# Patient Record
Sex: Male | Born: 1940 | Race: Black or African American | Hispanic: No | Smoking: Former smoker
Health system: Southern US, Community
[De-identification: ages and names within clinical notes are randomized; demographics above are authoritative.]

## PROBLEM LIST (undated history)

## (undated) DIAGNOSIS — K219 Gastro-esophageal reflux disease without esophagitis: Secondary | ICD-10-CM

## (undated) DIAGNOSIS — E7800 Pure hypercholesterolemia, unspecified: Secondary | ICD-10-CM

## (undated) DIAGNOSIS — G629 Polyneuropathy, unspecified: Secondary | ICD-10-CM

## (undated) DIAGNOSIS — I10 Essential (primary) hypertension: Secondary | ICD-10-CM

## (undated) DIAGNOSIS — C189 Malignant neoplasm of colon, unspecified: Secondary | ICD-10-CM

## (undated) HISTORY — DX: Polyneuropathy, unspecified: G62.9

## (undated) HISTORY — DX: Essential (primary) hypertension: I10

## (undated) HISTORY — DX: Malignant neoplasm of colon, unspecified: C18.9

## (undated) HISTORY — PX: COLONOSCOPY: SHX174

## (undated) HISTORY — DX: Pure hypercholesterolemia, unspecified: E78.00

---

## 1997-06-28 ENCOUNTER — Emergency Department (HOSPITAL_COMMUNITY): Admission: EM | Admit: 1997-06-28 | Discharge: 1997-06-28 | Payer: Self-pay | Admitting: Emergency Medicine

## 2004-01-31 ENCOUNTER — Encounter: Admission: RE | Admit: 2004-01-31 | Discharge: 2004-04-15 | Payer: Self-pay | Admitting: Family Medicine

## 2010-10-11 DIAGNOSIS — C189 Malignant neoplasm of colon, unspecified: Secondary | ICD-10-CM

## 2010-10-11 HISTORY — DX: Malignant neoplasm of colon, unspecified: C18.9

## 2010-10-14 ENCOUNTER — Inpatient Hospital Stay (HOSPITAL_COMMUNITY)
Admission: AD | Admit: 2010-10-14 | Discharge: 2010-11-01 | DRG: 330 | Disposition: A | Discharge status: Home Health | Payer: Medicare Other | Source: Other Acute Inpatient Hospital | Attending: General Surgery | Admitting: General Surgery

## 2010-10-14 DIAGNOSIS — Z87891 Personal history of nicotine dependence: Secondary | ICD-10-CM

## 2010-10-14 DIAGNOSIS — E039 Hypothyroidism, unspecified: Secondary | ICD-10-CM | POA: Diagnosis present

## 2010-10-14 DIAGNOSIS — K560 Paralytic ileus: Secondary | ICD-10-CM | POA: Diagnosis not present

## 2010-10-14 DIAGNOSIS — J9819 Other pulmonary collapse: Secondary | ICD-10-CM | POA: Diagnosis not present

## 2010-10-14 DIAGNOSIS — K56609 Unspecified intestinal obstruction, unspecified as to partial versus complete obstruction: Secondary | ICD-10-CM | POA: Diagnosis present

## 2010-10-14 DIAGNOSIS — E785 Hyperlipidemia, unspecified: Secondary | ICD-10-CM | POA: Diagnosis present

## 2010-10-14 DIAGNOSIS — E876 Hypokalemia: Secondary | ICD-10-CM | POA: Diagnosis not present

## 2010-10-14 DIAGNOSIS — I10 Essential (primary) hypertension: Secondary | ICD-10-CM | POA: Diagnosis present

## 2010-10-14 DIAGNOSIS — K219 Gastro-esophageal reflux disease without esophagitis: Secondary | ICD-10-CM | POA: Diagnosis present

## 2010-10-14 DIAGNOSIS — E7800 Pure hypercholesterolemia, unspecified: Secondary | ICD-10-CM | POA: Diagnosis present

## 2010-10-14 DIAGNOSIS — C187 Malignant neoplasm of sigmoid colon: Principal | ICD-10-CM | POA: Diagnosis present

## 2010-10-15 ENCOUNTER — Inpatient Hospital Stay (HOSPITAL_COMMUNITY): Payer: Medicare Other

## 2010-10-15 ENCOUNTER — Encounter (HOSPITAL_COMMUNITY): Payer: Self-pay | Admitting: Radiology

## 2010-10-15 LAB — DIFFERENTIAL
Basophils Absolute: 0 10*3/uL (ref 0.0–0.1)
Basophils Relative: 0 % (ref 0–1)
Eosinophils Relative: 0 % (ref 0–5)
Monocytes Absolute: 1.1 10*3/uL — ABNORMAL HIGH (ref 0.1–1.0)

## 2010-10-15 LAB — COMPREHENSIVE METABOLIC PANEL
ALT: 16 U/L (ref 0–53)
AST: 18 U/L (ref 0–37)
Albumin: 3.8 g/dL (ref 3.5–5.2)
BUN: 22 mg/dL (ref 6–23)
CO2: 20 mEq/L (ref 19–32)
Chloride: 105 mEq/L (ref 96–112)
Chloride: 104 mEq/L (ref 96–112)
Creatinine, Ser: 1.21 mg/dL (ref 0.50–1.35)
Creatinine, Ser: 1.43 mg/dL — ABNORMAL HIGH (ref 0.50–1.35)
GFR calc non Af Amer: 60 mL/min — ABNORMAL LOW (ref >60)
Sodium: 139 mEq/L (ref 135–145)
Total Bilirubin: 0.8 mg/dL (ref 0.3–1.2)
Total Bilirubin: 0.7 mg/dL (ref 0.3–1.2)
Total Protein: 7.6 g/dL (ref 6.0–8.3)

## 2010-10-15 LAB — LACTIC ACID, PLASMA: Lactic Acid, Venous: 1.2 mmol/L (ref 0.5–2.2)

## 2010-10-15 LAB — PROTIME-INR
INR: 1.01 (ref 0.00–1.49)
Prothrombin Time: 13.5 seconds (ref 11.6–15.2)

## 2010-10-15 LAB — CBC
MCHC: 32.9 g/dL (ref 30.0–36.0)
RDW: 15.2 % (ref 11.5–15.5)

## 2010-10-15 LAB — MAGNESIUM: Magnesium: 2.6 mg/dL — ABNORMAL HIGH (ref 1.5–2.5)

## 2010-10-15 MED ORDER — IOHEXOL 300 MG/ML  SOLN
100.0000 mL | Freq: Once | INTRAMUSCULAR | Status: AC | PRN
  Administered 2010-10-15: 100 mL via INTRAVENOUS

## 2010-10-16 ENCOUNTER — Inpatient Hospital Stay (HOSPITAL_COMMUNITY): Payer: Medicare Other

## 2010-10-16 LAB — DIFFERENTIAL
Basophils Absolute: 0 10*3/uL (ref 0.0–0.1)
Eosinophils Absolute: 0 10*3/uL (ref 0.0–0.7)
Lymphs Abs: 1.1 10*3/uL (ref 0.7–4.0)
Neutrophils Relative %: 69 % (ref 43–77)

## 2010-10-16 LAB — COMPREHENSIVE METABOLIC PANEL
ALT: 16 U/L (ref 0–53)
AST: 16 U/L (ref 0–37)
Alkaline Phosphatase: 62 U/L (ref 39–117)
CO2: 28 mEq/L (ref 19–32)
Chloride: 107 mEq/L (ref 96–112)
Creatinine, Ser: 1.31 mg/dL (ref 0.50–1.35)
GFR calc non Af Amer: 55 mL/min — ABNORMAL LOW (ref >60)
Potassium: 4.2 mEq/L (ref 3.5–5.1)
Sodium: 144 mEq/L (ref 135–145)
Total Bilirubin: 0.7 mg/dL (ref 0.3–1.2)

## 2010-10-16 LAB — CBC
MCV: 84.4 fL (ref 78.0–100.0)
Platelets: 213 10*3/uL (ref 150–400)
RBC: 5.26 MIL/uL (ref 4.22–5.81)
WBC: 7.2 10*3/uL (ref 4.0–10.5)

## 2010-10-17 ENCOUNTER — Inpatient Hospital Stay (HOSPITAL_COMMUNITY): Payer: Medicare Other

## 2010-10-17 LAB — COMPREHENSIVE METABOLIC PANEL
ALT: 15 U/L (ref 0–53)
AST: 24 U/L (ref 0–37)
Calcium: 8.7 mg/dL (ref 8.4–10.5)
Creatinine, Ser: 1.25 mg/dL (ref 0.50–1.35)
GFR calc Af Amer: >60 mL/min (ref >60)
Glucose, Bld: 109 mg/dL — ABNORMAL HIGH (ref 70–99)
Sodium: 145 mEq/L (ref 135–145)
Total Protein: 6.9 g/dL (ref 6.0–8.3)

## 2010-10-17 LAB — CBC
MCH: 27.4 pg (ref 26.0–34.0)
MCHC: 32.6 g/dL (ref 30.0–36.0)
MCV: 84.2 fL (ref 78.0–100.0)
Platelets: 213 10*3/uL (ref 150–400)

## 2010-10-17 LAB — DIFFERENTIAL
Basophils Relative: 0 % (ref 0–1)
Eosinophils Absolute: 0 10*3/uL (ref 0.0–0.7)
Eosinophils Relative: 1 % (ref 0–5)
Lymphs Abs: 1.1 10*3/uL (ref 0.7–4.0)
Monocytes Absolute: 1 10*3/uL (ref 0.1–1.0)
Monocytes Relative: 18 % — ABNORMAL HIGH (ref 3–12)

## 2010-10-17 MED ORDER — IOHEXOL 300 MG/ML  SOLN
450.0000 mL | Freq: Once | INTRAMUSCULAR | Status: AC | PRN
  Administered 2010-10-17: 450 mL

## 2010-10-18 ENCOUNTER — Other Ambulatory Visit: Payer: Self-pay | Admitting: Gastroenterology

## 2010-10-18 LAB — CBC
HCT: 42.9 % (ref 39.0–52.0)
Hemoglobin: 13.7 g/dL (ref 13.0–17.0)
MCH: 27.1 pg (ref 26.0–34.0)
MCHC: 31.9 g/dL (ref 30.0–36.0)
MCV: 85 fL (ref 78.0–100.0)
Platelets: 192 10*3/uL (ref 150–400)
RBC: 5.05 MIL/uL (ref 4.22–5.81)
RDW: 15.6 % — ABNORMAL HIGH (ref 11.5–15.5)
WBC: 5 10*3/uL (ref 4.0–10.5)

## 2010-10-18 LAB — COMPREHENSIVE METABOLIC PANEL
ALT: 13 U/L (ref 0–53)
AST: 12 U/L (ref 0–37)
Albumin: 3.2 g/dL — ABNORMAL LOW (ref 3.5–5.2)
Alkaline Phosphatase: 55 U/L (ref 39–117)
BUN: 22 mg/dL (ref 6–23)
CO2: 26 mEq/L (ref 19–32)
Calcium: 8.3 mg/dL — ABNORMAL LOW (ref 8.4–10.5)
Chloride: 111 mEq/L (ref 96–112)
Creatinine, Ser: 1.09 mg/dL (ref 0.50–1.35)
GFR calc Af Amer: >60 mL/min (ref >60)
GFR calc non Af Amer: >60 mL/min (ref >60)
Glucose, Bld: 102 mg/dL — ABNORMAL HIGH (ref 70–99)
Potassium: 4 mEq/L (ref 3.5–5.1)
Sodium: 147 mEq/L — ABNORMAL HIGH (ref 135–145)
Total Bilirubin: 0.6 mg/dL (ref 0.3–1.2)
Total Protein: 6.4 g/dL (ref 6.0–8.3)

## 2010-10-18 LAB — DIFFERENTIAL
Basophils Absolute: 0 10*3/uL (ref 0.0–0.1)
Basophils Relative: 1 % (ref 0–1)
Eosinophils Absolute: 0 10*3/uL (ref 0.0–0.7)
Eosinophils Relative: 1 % (ref 0–5)
Lymphocytes Relative: 20 % (ref 12–46)
Lymphs Abs: 1 10*3/uL (ref 0.7–4.0)
Monocytes Absolute: 1 10*3/uL (ref 0.1–1.0)
Monocytes Relative: 20 % — ABNORMAL HIGH (ref 3–12)
Neutro Abs: 3 10*3/uL (ref 1.7–7.7)
Neutrophils Relative %: 59 % (ref 43–77)

## 2010-10-19 DIAGNOSIS — K56609 Unspecified intestinal obstruction, unspecified as to partial versus complete obstruction: Secondary | ICD-10-CM

## 2010-10-19 DIAGNOSIS — C189 Malignant neoplasm of colon, unspecified: Secondary | ICD-10-CM

## 2010-10-19 LAB — CEA: CEA: 0.7 ng/mL (ref 0.0–5.0)

## 2010-10-19 LAB — CBC
HCT: 43 % (ref 39.0–52.0)
MCH: 27.6 pg (ref 26.0–34.0)
MCV: 84.3 fL (ref 78.0–100.0)
Platelets: 207 10*3/uL (ref 150–400)
RBC: 5.1 MIL/uL (ref 4.22–5.81)

## 2010-10-19 LAB — BASIC METABOLIC PANEL
BUN: 23 mg/dL (ref 6–23)
CO2: 26 mEq/L (ref 19–32)
Calcium: 8.4 mg/dL (ref 8.4–10.5)
Chloride: 111 mEq/L (ref 96–112)
Creatinine, Ser: 1.25 mg/dL (ref 0.50–1.35)

## 2010-10-19 NOTE — Progress Notes (Signed)
NAME:  Patrick Woodward, PULS NO.:  [id number]  MEDICAL RECORD NO.:  [id number]  LOCATION:  1318                         FACILITY:  [facility]  PHYSICIAN:  Talmage Nap, MD  DATE OF BIRTH:  Aug 22, 1940                                PROGRESS NOTE   PRIMARY CARE PHYSICIAN: Merlene Laughter. Renae Gloss, M.D.  CONSULTANTS: Involved in the case, 1. GI, Shirley Friar, MD/John C. Madilyn Fireman, M.D. 2. General Surgery, Sandria Bales. Ezzard Standing, M.D.  DIAGNOSES: 1. Large bowel obstruction, status post NG tube, status post flex     sigmoidoscopy.  Finding, obstructing mass at the transition     point from descending colon to the sigmoid colon.  Pathology report     pending. 2. Hypertension. 3. Hyperlipidemia. 4. Gastroesophageal reflux disease.  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  The patient was also said to have been constipated for about 2 days.  There was no history of fever.  There was no history of chills.  No rigor.  He, however, saw a physician at the [facility] and a CT done showed a 3 cm constrictive mass at the proximal sigmoid colon with dilated colon subsequently.  The patient requested to be transferred to [facility] since he lives here.  He was, however, admitted by Dr. Carlota Raspberry, for further evaluation and stabilization.  PREADMISSION MEDICATIONS: 1. Aspirin 325 mg p.o. daily. 2. Lisinopril 20 mg p.o. daily. 3. Metoprolol 25 mg daily. 4. Nexium 40 mg p.o. daily.  ALLERGIES: PROCARDIA.  PAST SURGICAL HISTORY: He has no known past surgical history.  SOCIAL HISTORY: Negative for alcohol or tobacco use.  The patient works as a Magazine features editor.  FAMILY HISTORY: York Spaniel to be positive for  colon cancer.  Father deceased from prostate CA.  REVIEW OF SYSTEMS: Essentially documented in the initial history and physical.  PHYSICAL EXAMINATION: At the time the patient was seen by the admitting physician, VITAL SIGNS:  Temperature is 98.6, pulse 100, blood pressure is 118/74, respiratory rate 18, saturating 93% on room air.  He is said to be uncomfortable, vomiting. HEENT:  Pupils are reactive to light and extraocular muscles intact. NECK:  He has no jugular venous distention.  No carotid bruit.  No lymphadenopathy. CHEST:  Clear to auscultation. HEART:  Heart sounds are 1 and 2. ABDOMEN:  Soft, diffusely tenderness more in the periumbilical area. There is no sign of peritoneal irritation.  Liver, spleen, kidney not palpable.  Bowel sounds are hypoactive. Extremities:  Show no pedal edema. NEUROLOGIC:  Nonfocal. MUSCULOSKELETAL SYSTEM:  Unremarkable. SKIN:  Said to be warm and dry.  LABORATORY DATA: Lactic acid level 1.2.  Comprehensive metabolic panel showed sodium of 139, potassium of 3.8, chloride of 105 with a bicarb of 20, glucose is 111, BUN is 17, creatinine is 1.21.  LFTs are normal.  Complete blood count with differential showed WBC of 13.4, hemoglobin of 14.6, hematocrit of 44.4, MCV 82.7, the platelet count of 207.  Coagulation profile  showed PT 13.5, INR 1.01. and a PTT of 31.  A repeat complete blood count with no differential done on October 19, 2010, showed WBC of 7.2, hemoglobin of 14.1, hematocrit of 43.0, MCV 84.3, platelet count of 207, magnesium level is 2.7 and basic metabolic panel showed sodium of 147, potassium of 4.0, chloride of 111, bicarb is 26, glucose is 101, BUN is 23, creatinine is 1.25.  IMAGING STUDIES: Done on the patient include CT of the abdomen and pelvis, which showed diffuse dilatation of a large and small bowel with smooth tapering of the large bowel from the level of the splenic flexure to the sigmoid colon.  Findings are most  consistent with significant ileus.  No evidence of mass or cyst.  There is no evidence for acute appendicitis. A repeat CT of the abdomen was done and it showed persistent distended small bowel and colon  and ileus, and barium enema, showed upper quadrant lesion at the junction between the descending colon as well as sigmoid colon.  Finding is concerning for malignancy just secondary to colonic neoplasm.  HOSPITAL COURSE: The patient was admitted to general medical floor.  He was made n.p.o., had NG tube and started with intermittent low pressure suction and the NG tube was said to be draining fecal material.  He was also placed on normal saline to go at rate of 100 cc an hour.  The patient was given Zofran and Phenergan for nausea and pain control was done with Dilantin 1-2 mg IV q.4 p.r.n.  The patient was followed and evaluated by me on a daily basis.  He was also evaluated by the gastroenterologist who subsequently did a flex sigmoidoscopy on the patient and his findings were that of an descending colonic mild that is obstructing and about the results has been awaited.  Throughout admission, the patient has been having continuous fluid drained from the NG tube.  Today, which is October 19, 2010, discussed this extensively with the general surgeon, Dr. Ezzard Standing, and he is agreeable to evaluate the patient.  CURRENT MEDICATIONS: 1. Dilaudid 2 mg IV q.3 p.r.n. 2. Zofran 4 mg IV q.6 p.r.n. 3. Phenergan 12.5 mg IV q.6 p.r.n. 4. NG tube with intermittent suction.  The patient will be followed and evaluated on a daily basis or the meantime, I would order a CEA level.     Talmage Nap, MD     CN/MEDQ  D:  10/19/2010  T:  10/19/2010  Job:  [id number]  Electronically Signed by Talmage Nap  on 10/19/2010 07:05:11 PM

## 2010-10-20 ENCOUNTER — Other Ambulatory Visit (INDEPENDENT_AMBULATORY_CARE_PROVIDER_SITE_OTHER): Payer: Self-pay | Admitting: Surgery

## 2010-10-20 HISTORY — PX: COLON SURGERY: SHX602

## 2010-10-20 LAB — DIFFERENTIAL
Basophils Relative: 1 % (ref 0–1)
Eosinophils Absolute: 0.1 10*3/uL (ref 0.0–0.7)
Eosinophils Relative: 1 % (ref 0–5)
Lymphocytes Relative: 13 % (ref 12–46)
Monocytes Relative: 20 % — ABNORMAL HIGH (ref 3–12)
Neutro Abs: 4.7 10*3/uL (ref 1.7–7.7)
Neutrophils Relative %: 65 % (ref 43–77)

## 2010-10-20 LAB — MAGNESIUM: Magnesium: 2.7 mg/dL — ABNORMAL HIGH (ref 1.5–2.5)

## 2010-10-20 LAB — COMPREHENSIVE METABOLIC PANEL
Albumin: 3 g/dL — ABNORMAL LOW (ref 3.5–5.2)
Alkaline Phosphatase: 52 U/L (ref 39–117)
BUN: 18 mg/dL (ref 6–23)
CO2: 30 mEq/L (ref 19–32)
Chloride: 109 mEq/L (ref 96–112)
Creatinine, Ser: 1.08 mg/dL (ref 0.50–1.35)
GFR calc non Af Amer: >60 mL/min (ref >60)
Potassium: 4 mEq/L (ref 3.5–5.1)
Total Bilirubin: 0.5 mg/dL (ref 0.3–1.2)

## 2010-10-20 LAB — CBC
HCT: 40.7 % (ref 39.0–52.0)
Hemoglobin: 13.2 g/dL (ref 13.0–17.0)
MCV: 84.6 fL (ref 78.0–100.0)
RBC: 4.81 MIL/uL (ref 4.22–5.81)
RDW: 15.6 % — ABNORMAL HIGH (ref 11.5–15.5)
WBC: 7.3 10*3/uL (ref 4.0–10.5)

## 2010-10-21 LAB — DIFFERENTIAL
Basophils Relative: 1 % (ref 0–1)
Eosinophils Absolute: 0.1 10*3/uL (ref 0.0–0.7)
Eosinophils Relative: 1 % (ref 0–5)
Lymphs Abs: 1 10*3/uL (ref 0.7–4.0)
Monocytes Absolute: 1.3 10*3/uL — ABNORMAL HIGH (ref 0.1–1.0)
Neutrophils Relative %: 64 % (ref 43–77)

## 2010-10-21 LAB — CBC
Hemoglobin: 14.9 g/dL (ref 13.0–17.0)
MCH: 27.4 pg (ref 26.0–34.0)
MCHC: 32.5 g/dL (ref 30.0–36.0)
MCV: 84.5 fL (ref 78.0–100.0)
RBC: 5.43 MIL/uL (ref 4.22–5.81)

## 2010-10-21 LAB — COMPREHENSIVE METABOLIC PANEL
CO2: 31 mEq/L (ref 19–32)
Calcium: 8.2 mg/dL — ABNORMAL LOW (ref 8.4–10.5)
Creatinine, Ser: 1.14 mg/dL (ref 0.50–1.35)
GFR calc Af Amer: >60 mL/min (ref >60)
GFR calc non Af Amer: >60 mL/min (ref >60)
Glucose, Bld: 156 mg/dL — ABNORMAL HIGH (ref 70–99)
Total Protein: 5.9 g/dL — ABNORMAL LOW (ref 6.0–8.3)

## 2010-10-21 LAB — MAGNESIUM: Magnesium: 2.4 mg/dL (ref 1.5–2.5)

## 2010-10-21 NOTE — Op Note (Signed)
NAME:  JAHZIER, VILLALON NO.:  [id number]  MEDICAL RECORD NO.:  [id number]  LOCATION:  1318                         FACILITY:  [facility]  PHYSICIAN:  Maisie Fus A. Pauline Trainer, M.D.DATE OF BIRTH:  [birth year]  DATE OF PROCEDURE:  10/20/2010 DATE OF DISCHARGE:                              OPERATIVE REPORT   PREOPERATIVE DIAGNOSIS:  Obstructing colon mass at descending colon- sigmoid colon junction.  POSTOPERATIVE DIAGNOSIS:  Obstructing colon mass at descending colon- sigmoid colon junction.  PROCEDURE:  Partial colectomy with end colostomy with Hartmann pouch.  SURGEON:  Maisie Fus A. Jalexus Brett, M.D.  ASSISTANT:  Anselm Pancoast. Zachery Dakins, M.D.  ANESTHESIA:  General endotracheal anesthesia.  ESTIMATED BLOOD LOSS:  100 mL.  SPECIMEN:  Descending colon with colon mass to Pathology.  DRAINS:  None.  INDICATIONS FOR PROCEDURE:  The patient is a 70 year old male who was admitted 6 days ago to the medical service with large bowel obstruction. He was eventually worked up by GI, and found to have an obstructing colon mass at 40 cm from the anal verge, which was thought to be the distal descending colon and proximal sigmoid colon junction.  He was working in [place] and he was diagnosed down there about a week ago and transferred here.  He has had a large bowel obstruction and treated with NG tube.  He presents today for surgery since he has not improved with conservative measures.  This was also documented on CT scan.  Risk of bleeding, infection, death, intraabdominal abscess, organ injury, injury to the ureter, kidney, bladder, wound complication, ostomy problems, and worsening of medical conditions were discussed.  He understood the above and agreed to proceed.  DESCRIPTION OF PROCEDURE:  The patient was brought to the operating room after being seen in the holding area.  I answered his questions. Procedure was discussed in the holding area and questions were answered to  the best of my ability.  He was then taken back to the operating room.  After induction of general anesthesia, the abdomen was prepped and draped in sterile fashion.  Foley catheter was placed.  He received 1 g of Invanz.  A time-out was done.  Midline incision was used.  We entered the abdominal cavity easily.  He had significant dilated small bowel in ascending, transverse, and descending colon down to a mass, which was located at the distal descending colon-sigmoid colon junction. I was able to move the small bowel out of the way using a retractor, and then mobilized the sigmoid colon and descending colon up into the wound. This was a large obstructing hard mass, that looked to be a cancer.  I then used a GIA-75 stapling device, divided approximately about 3 cm, and then one additional 6 to 7 cm distal to that and divided with stapler again.  We used a LigaSure to take the mesentery down to the IMA.  I oversewed the IMA with 2-0 Vicryl.  The ureter in the left side was identified and preserved.  Mass was sent to Pathology and gross margins were negative.  At this point, I irrigated out the abdominal cavity.  Small piece of Seprafilm was placed on the base of the Hartmann stump, which was  relatively generous.  We then found appropriate spot to pull out the descending colostomy.  Kocher was used and the appropriate spot was identified just to the left of the umbilicus.  Curvilinear incision was made.  We then dissected down the rectus muscle and made a cruciate incision in the rectus muscle  and we were able to pass 2 fingers through this.  We then were able to reach in and pull the colon through the colostomy.  I put two securing sutures at the undersurface of the fascia to secure the colostomy to the abdominal wall.  We then ran the small bowel through loop of Treitz to the ileocecal valve.  This was dilated into the cecum.  The ascending and transverse colons were also distended  significantly without any signs of necrosis or perforation.  I could not view any other mass in the colon, but his colonoscopy did not go past the mass.  I could not feel anything at the ascending, transverse, descending colon.  Gallbladder was distended without stones.  Liver was normal.  NG tube was in the stomach.  At this point in time, we went ahead and closed the fascia after sponge, needle and instrument counts were done.  The fascia was closed with double- stranded #1 PDS.  I then went ahead and stapled the skin closed.  The colostomy matured with 3-0 Monocryl.  Appliances were applied as well as __________.  All final counts of sponge, needle, and instruments were found to be correct at this portion of the case.  The patient was awoken, extubated, and taken to recovery in satisfactory condition.     Shamra Bradeen A. Mitali Shenefield, M.D.     TAC/MEDQ  D:  10/20/2010  T:  10/20/2010  Job:  [id number]  Electronically Signed by Harriette Bouillon M.D. on 10/21/2010 10:35:27 AM

## 2010-10-22 ENCOUNTER — Inpatient Hospital Stay (HOSPITAL_COMMUNITY): Payer: Medicare Other

## 2010-10-22 LAB — COMPREHENSIVE METABOLIC PANEL
ALT: 15 U/L (ref 0–53)
AST: 24 U/L (ref 0–37)
CO2: 32 mEq/L (ref 19–32)
Calcium: 8.4 mg/dL (ref 8.4–10.5)
GFR calc non Af Amer: >60 mL/min (ref >60)
Potassium: 4.3 mEq/L (ref 3.5–5.1)
Sodium: 144 mEq/L (ref 135–145)

## 2010-10-22 LAB — DIFFERENTIAL
Basophils Absolute: 0.1 10*3/uL (ref 0.0–0.1)
Eosinophils Absolute: 0.1 10*3/uL (ref 0.0–0.7)
Lymphocytes Relative: 16 % (ref 12–46)
Neutrophils Relative %: 60 % (ref 43–77)

## 2010-10-22 LAB — CBC
MCH: 27 pg (ref 26.0–34.0)
Platelets: 177 10*3/uL (ref 150–400)
RBC: 5.04 MIL/uL (ref 4.22–5.81)
WBC: 6.9 10*3/uL (ref 4.0–10.5)

## 2010-10-24 DIAGNOSIS — E46 Unspecified protein-calorie malnutrition: Secondary | ICD-10-CM

## 2010-10-24 LAB — COMPREHENSIVE METABOLIC PANEL
ALT: 22 U/L (ref 0–53)
Calcium: 8.2 mg/dL — ABNORMAL LOW (ref 8.4–10.5)
Creatinine, Ser: 0.95 mg/dL (ref 0.50–1.35)
GFR calc Af Amer: >60 mL/min (ref >60)
GFR calc non Af Amer: >60 mL/min (ref >60)
Glucose, Bld: 118 mg/dL — ABNORMAL HIGH (ref 70–99)
Sodium: 144 mEq/L (ref 135–145)
Total Protein: 5.7 g/dL — ABNORMAL LOW (ref 6.0–8.3)

## 2010-10-24 LAB — DIFFERENTIAL
Basophils Absolute: 0.1 10*3/uL (ref 0.0–0.1)
Basophils Relative: 1 % (ref 0–1)
Eosinophils Absolute: 0.1 10*3/uL (ref 0.0–0.7)
Lymphocytes Relative: 4 % — ABNORMAL LOW (ref 12–46)
Monocytes Absolute: 0.4 10*3/uL (ref 0.1–1.0)
Neutrophils Relative %: 88 % — ABNORMAL HIGH (ref 43–77)

## 2010-10-24 LAB — GLUCOSE, CAPILLARY
Glucose-Capillary: 108 mg/dL — ABNORMAL HIGH (ref 70–99)
Glucose-Capillary: 123 mg/dL — ABNORMAL HIGH (ref 70–99)

## 2010-10-24 LAB — CBC
MCHC: 31.6 g/dL (ref 30.0–36.0)
Platelets: 160 10*3/uL (ref 150–400)
RDW: 15.7 % — ABNORMAL HIGH (ref 11.5–15.5)
WBC: 7.3 10*3/uL (ref 4.0–10.5)

## 2010-10-24 LAB — PREALBUMIN: Prealbumin: 6 mg/dL — ABNORMAL LOW (ref 17.0–34.0)

## 2010-10-24 LAB — CHOLESTEROL, TOTAL: Cholesterol: 105 mg/dL (ref 0–200)

## 2010-10-24 LAB — TRIGLYCERIDES: Triglycerides: 169 mg/dL — ABNORMAL HIGH (ref <150)

## 2010-10-24 LAB — PHOSPHORUS: Phosphorus: 2.7 mg/dL (ref 2.3–4.6)

## 2010-10-25 LAB — COMPREHENSIVE METABOLIC PANEL
ALT: 30 U/L (ref 0–53)
Alkaline Phosphatase: 75 U/L (ref 39–117)
BUN: 16 mg/dL (ref 6–23)
Chloride: 102 mEq/L (ref 96–112)
GFR calc Af Amer: >60 mL/min (ref >60)
Glucose, Bld: 110 mg/dL — ABNORMAL HIGH (ref 70–99)
Potassium: 3.3 mEq/L — ABNORMAL LOW (ref 3.5–5.1)
Sodium: 140 mEq/L (ref 135–145)
Total Bilirubin: 0.4 mg/dL (ref 0.3–1.2)
Total Protein: 6.1 g/dL (ref 6.0–8.3)

## 2010-10-25 LAB — GLUCOSE, CAPILLARY
Glucose-Capillary: 121 mg/dL — ABNORMAL HIGH (ref 70–99)
Glucose-Capillary: 117 mg/dL — ABNORMAL HIGH (ref 70–99)

## 2010-10-25 LAB — MAGNESIUM: Magnesium: 2.4 mg/dL (ref 1.5–2.5)

## 2010-10-26 LAB — BASIC METABOLIC PANEL
CO2: 28 mEq/L (ref 19–32)
Calcium: 8.7 mg/dL (ref 8.4–10.5)
GFR calc non Af Amer: >60 mL/min (ref >60)
Glucose, Bld: 123 mg/dL — ABNORMAL HIGH (ref 70–99)
Potassium: 3.4 mEq/L — ABNORMAL LOW (ref 3.5–5.1)
Sodium: 138 mEq/L (ref 135–145)

## 2010-10-26 LAB — GLUCOSE, CAPILLARY
Glucose-Capillary: 126 mg/dL — ABNORMAL HIGH (ref 70–99)
Glucose-Capillary: 138 mg/dL — ABNORMAL HIGH (ref 70–99)
Glucose-Capillary: 128 mg/dL — ABNORMAL HIGH (ref 70–99)

## 2010-10-27 LAB — COMPREHENSIVE METABOLIC PANEL
Albumin: 2.7 g/dL — ABNORMAL LOW (ref 3.5–5.2)
BUN: 19 mg/dL (ref 6–23)
Creatinine, Ser: 1.06 mg/dL (ref 0.50–1.35)
Total Protein: 6.9 g/dL (ref 6.0–8.3)

## 2010-10-27 LAB — DIFFERENTIAL
Basophils Absolute: 0.1 10*3/uL (ref 0.0–0.1)
Basophils Relative: 1 % (ref 0–1)
Eosinophils Relative: 2 % (ref 0–5)
Monocytes Absolute: 0.9 10*3/uL (ref 0.1–1.0)

## 2010-10-27 LAB — CHOLESTEROL, TOTAL: Cholesterol: 107 mg/dL (ref 0–200)

## 2010-10-27 LAB — GLUCOSE, CAPILLARY
Glucose-Capillary: 132 mg/dL — ABNORMAL HIGH (ref 70–99)
Glucose-Capillary: 133 mg/dL — ABNORMAL HIGH (ref 70–99)

## 2010-10-27 LAB — CBC
MCHC: 33.6 g/dL (ref 30.0–36.0)
Platelets: 197 10*3/uL (ref 150–400)
RDW: 15.2 % (ref 11.5–15.5)
WBC: 9.2 10*3/uL (ref 4.0–10.5)

## 2010-10-27 LAB — PHOSPHORUS: Phosphorus: 3.3 mg/dL (ref 2.3–4.6)

## 2010-10-27 LAB — MAGNESIUM: Magnesium: 2.3 mg/dL (ref 1.5–2.5)

## 2010-10-27 LAB — TRIGLYCERIDES: Triglycerides: 169 mg/dL — ABNORMAL HIGH (ref <150)

## 2010-10-27 LAB — PREALBUMIN: Prealbumin: 19 mg/dL (ref 17.0–34.0)

## 2010-10-28 ENCOUNTER — Inpatient Hospital Stay (HOSPITAL_COMMUNITY): Payer: Medicare Other

## 2010-10-28 LAB — CULTURE, BLOOD (ROUTINE X 2)
Culture  Setup Time: 201209120849
Culture: NO GROWTH

## 2010-10-28 LAB — GLUCOSE, CAPILLARY
Glucose-Capillary: 138 mg/dL — ABNORMAL HIGH (ref 70–99)
Glucose-Capillary: 122 mg/dL — ABNORMAL HIGH (ref 70–99)

## 2010-10-29 LAB — BASIC METABOLIC PANEL
CO2: 27 mEq/L (ref 19–32)
Calcium: 9 mg/dL (ref 8.4–10.5)
Creatinine, Ser: 1.03 mg/dL (ref 0.50–1.35)
GFR calc Af Amer: >60 mL/min (ref >60)

## 2010-10-29 LAB — GLUCOSE, CAPILLARY
Glucose-Capillary: 114 mg/dL — ABNORMAL HIGH (ref 70–99)
Glucose-Capillary: 129 mg/dL — ABNORMAL HIGH (ref 70–99)
Glucose-Capillary: 138 mg/dL — ABNORMAL HIGH (ref 70–99)

## 2010-10-29 NOTE — Consult Note (Signed)
NAME:  Patrick Woodward, KUTCH NO.:  [id number]  MEDICAL RECORD NO.:  [id number]  LOCATION:  1318                         FACILITY:  [facility]  PHYSICIAN:  Shirley Friar, MDDATE OF BIRTH:  1940-05-14  DATE OF CONSULTATION: DATE OF DISCHARGE:                                CONSULTATION   REQUESTING PHYSICIAN:  Talmage Nap, MD  REASON FOR CONSULTATION:  Abdominal pain, bowel obstruction.  HISTORY OF PRESENT ILLNESS:  Mr. Smelser is a 70 year old black male, who was in [place] for work, when he was admitted down there for diffuse abdominal pain, abdominal distention, nausea, and vomiting, he was worked up there and a CT scan reportedly showed a constrictive lesion in his sigmoid colon.  He requested to be sent to [facility] in [place], which is where he is from and he has been medically managed here.  He has NG tube in for low wall suction.  He continues to have abdominal pain, nausea, and vomiting.  A CT scan done on September 6, here with contrast did not show a mass in the colon, but did show diffuse dilation of the large and small bowel.  It also reported small hepatic cyst with no evidence of acute appendicitis. Today, he had a Gastrografin study done, which showed an apple-core lesion at the junction between the distal descending colon and sigmoid colon consistent with a colonic neoplasm.  Small amount of contrast passed into the descending colon.  Mr. Sanderson reports having colonoscopy 7 to 8 years ago in [place] and states that he had a couple of polyps removed, but the results are not available at this time.  He has been having constipation recently prior to his admission to the hospital in [place].  PAST MEDICAL HISTORY: 1. Hypertension. 2. Hyperlipidemia. 3. Gastroesophageal reflux disease.  PAST SURGICAL HISTORY:  None.  CURRENT MEDICATIONS:  Subcu heparin, Peridex, Biotin; doses listed in the  hospital record, additional medicines listed p.r.n.  ALLERGIES:  NIFEDIPINE.  FAMILY HISTORY:  Mother died of colon cancer, otherwise noncontributory.  SOCIAL HISTORY:  Former smoker, denies alcohol.  REVIEW OF SYSTEMS:  Negative from GI standpoint except stated above.  PHYSICAL EXAMINATION:  VITAL SIGNS:  Temperature 98.2, pulse 86, blood pressure 107/57, O2 sat 95% on room air. GENERAL:  Alert, thin, no acute distress.  NG tube in place. ABDOMEN:  Distended, tender, decreased bowel sounds.  LABS:  White blood count 5.9 down from 13.4, hemoglobin 14.1, platelet 213, INR 1.01, BUN 25, creatinine 1.25, T-bili 0.5, ALP 57, AST 24, ALT 15.  Additional labs listed in the hospital record and were reviewed.  IMPRESSION: 52. A 70 year old black male with bowel obstruction due to a colon mass     at the distal descending colon/sigmoid colon junction that was seen     on Gastrografin enema as well as reportedly on an outside CT scan.     He will need a sigmoidoscopy tomorrow and surgical consult pending     his sigmoidoscopy results, which show a near obstructing lesion on     Gastrografin enema. 2. A flexible sigmoidoscopy with Fleet enema prep tomorrow by Dr.     Madilyn Fireman. 3. Continue NG suction.  4. NPO at the midnight except medicines. 5. Surgical consult will be needed, pending flexible sigmoidoscopy     results.  Discussed with Dr. Talmage Nap.     Shirley Friar, MD     VCS/MEDQ  D:  10/17/2010  T:  10/18/2010  Job:  [id number]  cc:   Merlene Laughter. Renae Gloss, M.D. Fax: [phone]  Electronically Signed by Charlott Rakes MD on 10/29/2010 11:13:52 AM

## 2010-10-30 LAB — GLUCOSE, CAPILLARY

## 2010-10-30 LAB — COMPREHENSIVE METABOLIC PANEL
ALT: 109 U/L — ABNORMAL HIGH (ref 0–53)
Alkaline Phosphatase: 113 U/L (ref 39–117)
GFR calc Af Amer: >60 mL/min (ref >60)
Glucose, Bld: 106 mg/dL — ABNORMAL HIGH (ref 70–99)
Potassium: 3.6 mEq/L (ref 3.5–5.1)
Sodium: 133 mEq/L — ABNORMAL LOW (ref 135–145)
Total Protein: 6 g/dL (ref 6.0–8.3)

## 2010-10-31 ENCOUNTER — Encounter: Payer: Medicare Other | Admitting: Oncology

## 2010-10-31 LAB — GLUCOSE, CAPILLARY: Glucose-Capillary: 109 mg/dL — ABNORMAL HIGH (ref 70–99)

## 2010-11-01 LAB — GLUCOSE, CAPILLARY: Glucose-Capillary: 103 mg/dL — ABNORMAL HIGH (ref 70–99)

## 2010-11-02 NOTE — Op Note (Signed)
  NAME:  Patrick Woodward, Patrick Woodward NO.:  [id number]  MEDICAL RECORD NO.:  [id number]  LOCATION:  1318                         FACILITY:  [facility]  PHYSICIAN:  Desteny Freeman C. Madilyn Fireman, M.D.    DATE OF BIRTH:  1940/12/09  DATE OF PROCEDURE:  10/18/2010 DATE OF DISCHARGE:                              OPERATIVE REPORT   OPERATION:  Flexible sigmoidoscopy with biopsy.  INDICATIONS FOR PROCEDURE:  Colonic obstruction with apple-core mass suggestive of adenocarcinoma on CT scan.  PROCEDURE:  The patient was placed in the left lateral decubitus position and placed on the pulse monitor with continuous low-flow oxygen delivered by nasal cannula.  He was sedated with 31.25 mg IV fentanyl, 4 mg IV Versed and 25 mg IV Benadryl.  The Olympus video colonoscope was inserted into the rectum and advanced to approximately 40 cm where a circumferential obstructing mass was encountered, typical of an adenocarcinoma.  I could not find the lumen within the mass.  Multiple biopsies were taken and further documentation obtained.  The mucosa distal to the lesion all the way down to the rectum appeared normal with no diverticula or other abnormality seen.  The scope was then withdrawn and the patient returned to the recovery room in stable condition.  He tolerated procedure well.  There were no immediate complications.  IMPRESSION:  Ascending colon mass, obstructing.  PLAN:  Await biopsy results and will need Surgery consult.          ______________________________ Everardo All. Madilyn Fireman, M.D.     JCH/MEDQ  D:  10/18/2010  T:  10/18/2010  Job:  [id number]  Electronically Signed by Dorena Cookey M.D. on 11/02/2010 11:42:41 AM
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
## 2010-11-05 ENCOUNTER — Encounter (INDEPENDENT_AMBULATORY_CARE_PROVIDER_SITE_OTHER): Payer: Self-pay | Admitting: Surgery

## 2010-11-05 ENCOUNTER — Ambulatory Visit (INDEPENDENT_AMBULATORY_CARE_PROVIDER_SITE_OTHER): Payer: Medicare Other | Admitting: Surgery

## 2010-11-05 VITALS — BP 102/68 | HR 80 | Temp 97.1°F | Resp 20 | Ht 77.5 in | Wt 161.2 lb

## 2010-11-05 DIAGNOSIS — Z9889 Other specified postprocedural states: Secondary | ICD-10-CM

## 2010-11-05 NOTE — Progress Notes (Signed)
The patient is 3 weeks status post left hemicolectomy for a T3 N1MX COLON CANCER.  He presented with obstruction.  He is doing well.   On exam:  Incision well healed.  Ostomy viable and funtioning. Impression : T3N1MX stage 3 colon cancer Plan:  Refer to medical oncology.  May need port.  Follow up 3 weeks.

## 2010-11-05 NOTE — Patient Instructions (Signed)
Follow up 3 weeks

## 2010-11-12 NOTE — H&P (Signed)
NAME:  JAHAN, FRIEDLANDER NO.:  [id number]  MEDICAL RECORD NO.:  [id number]  LOCATION:  1318                         FACILITY:  [facility]  PHYSICIAN:  Carlota Raspberry, MD         DATE OF BIRTH:  [birth year]  DATE OF ADMISSION:  10/14/2010 DATE OF DISCHARGE:                             HISTORY & PHYSICAL   PRIMARY CARE PHYSICIAN:  Dr. Mikeal Hawthorne  CHIEF COMPLAINT:  Abdominal pain transfer from [facility] with small bowel obstruction.  HISTORY OF PRESENT ILLNESS:  This is a 70 year old male with a history of hypertension, hyperlipidemia, and GERD who was in his usual state of health until October 13, 2010 when he was visiting [place] as part of his job as a Database administrator.  He was admitted to the hospital with diffuse periumbilical abdominal pain and increased abdominal distention, also with nausea and vomiting, and no bowel movement for 2 days, which was unusual for him.  He was admitted to the hospital where his lab work was fairly unrevealing including a white blood cell count of 10 and a creatinine of 1.3 and an abdominal x-ray initially showed a nonspecific ileus pattern, but then he then underwent a CT scan of his abdomen and pelvis, which showed a 3 cm constrictive lesion in his proximal sigmoid colon with gas and stool within the dilated colon proximal to the lesion with the impression of a probable partially obstructed carcinoma within the proximal sigmoid colon with colonoscopy recommended.  Given that he is from [place], was only visiting, he requested transfer to the [facility] system.  On arrival, the patient is complaining of significant nausea and probably begins to vomit while I am interviewing him with bilious yellow appearing vomit.  He continues to have cramping abdominal pain that is pretty severe around his abdomen.  He relates that his bowels have been "lot," but did have a small bowel movement on Sunday.  He is  unclear if he is passing flatus or not.  He denies any melena or any blood in his stool recently.  He also denies any weight changes, fevers, chills, night sweats, headache, dizziness, loss of consciousness, shortness of breath, cough, chest pain, bladder issues, or any rash.  REVIEW OF SYSTEMS:  As above, otherwise negative to all other systems.  PAST MEDICAL HISTORY: 1. Hypertension. 2. Hyperlipidemia. 3. Gastroesophageal reflux disease. 4. The patient had a colonoscopy within the last 5 years that he     believes that the results were negative.  PAST SURGICAL HISTORY:  None.  ALLERGIES:  PROCARDIA, which causes him to have palpitations.  HOME MEDICATIONS: 1. Aspirin 325 mg. 2. Lisinopril 20 mg. 3. Metoprolol tartrate 25 mg daily. 4. Nexium 40 mg daily.  SOCIAL HISTORY:  He quit smoking tobacco 35 years ago and he does not drink any more.  No alcohol.  No illicit drug use.  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  He has a wife and numerous family members who are in the Ministry.  FAMILY HISTORY:  Mother is deceased from colon cancer.  Father is  deceased from prostate cancer.  PHYSICAL EXAMINATION:  VITAL SIGNS:  Temperature 98.6, pulse 100, blood pressure 118/74, respirations 18, 93% on room air. GENERAL:  He is a thin male lying in the hospital bed with numerous family members at the bedside.  He appears quite uncomfortable with nausea and begins to vomit while we are talking to him.  He does not appear in extremis and is able to converse though. HEENT:  His pupils are equal, round, reactive to light.  His sclerae are clear, but do have scleral muddying.  They are anicteric.  His mouth is moist and normal-appearing with no apparent lesions. NECK:  Supple.  There is no cervical lymphadenopathy. LUNGS:  Clear to auscultation bilaterally without any crackles, wheezes, or rhonchi. HEART:   Regular rate and rhythm with a bit of tachycardia, but there are no apparent murmurs or gallops. ABDOMEN:  Not obese.  It is tight and very diffusely tender, but more so in the periumbilical area.  He does not have frank peritoneal signs, but does appear very tender to the lightest touch.  His bowel sounds are hypoactive. EXTREMITIES:  Warm and well-perfused without any cyanosis or clubbing. There is no bilateral lower extremity edema or chronic venous stasis changes.  His extremities are thin.  His radial pulses are easily palpable. NEUROLOGIC:  Grossly intact.  There are no focal neurological deficits. He is conversant, alert, oriented, and spontaneously moving all of his extremities. SKIN:  Warm, well-perfused, nondiaphoretic, and without any appreciable noticeable rashes.  LABORATORY DATA:  There is no lab work in our system.  Lab work from the outside hospital shows a white blood cell count of 10.0 with a normal differential, hematocrit is 42.7, and platelet count 173.  Magnesium was 2.2, phosphorus 3.8.  Amylase was 71.  Sodium 142, potassium 3.8, chloride 109, bicarb 25, glucose 103, BUN 11, creatinine 1.2, calcium was 8.5, total protein 6.8, albumin 3.7, ALT and AST are both 18, alk phos is 58, T bili is 0.8.  Lipase is 25.  IMAGING DATA:  CT abdomen and pelvis from [facility] shows bibasilar infiltrates in his bilateral lung bases, 3 cm constrictive lesion in the proximal sigmoid colon with gas and stool within the dilated colon proximal to the lesion.  Kidney, spleen, and liver are unremarkable except for multiple left hepatic cysts of which the largest measures 19 mm.  Gallbladder, adrenal glands, pancreas, aorta, small bowel, appendix, soft tissues, and bony structures are unremarkable.  Probable partially obstructed carcinoma within the proximal sigmoid colon, colonoscopy recommended.  CT scan of pelvis, the prostate is slightly enlarged.  The bladder, soft  tissues, and the bony structures are unremarkable.  Impression, mild prostatic enlargement.  An x-ray of his abdomen with the flattened decubitus, impression shows probable ileus.  UA shows trace ketones, trace proteins, no signs of infection, and 1.0 urine urobilinogen  IMPRESSION:  This is a 70 year old male with a history of hypertension, hyperlipidemia, and otherwise fairly healthy with the family history of colon cancer who presents with a 3 cm constrictive lesion in the proximal sigmoid colon and evidence for bowel obstruction. 1. Large bowel obstruction.  He has no previous abdominal surgeries.     This is most likely due to what appears to be an annular 3 cm     proximal sigmoid carcinoma.  He will need a GI consult and     colonoscopy and likely need a surgical consult as well.  For overnight, I have repeatedly urged the  patient to get an NG tube as he is actively vomiting in front of me, but he continues to refuse it. Therefore, at this point, we will give him IV fluids, keep him n.p.o., and control his nausea and pain with Zofran and Dilaudid.  We will get some blood work right now including CBC with differential, CMET, LFTs, and venous lactate, 1. History of hypertension.  The patient is currently normotensive and    we will hold his home lisinopril and metoprolol given his acute     issues.  We will also hold his home aspirin 325. 2. Fluid, electrolytes, and nutrition.  We will run continuous 100     cc/hour of normal saline and keep him n.p.o.  IV access.  He has one peripheral IV.  Prophylaxis.  We will cover wound with subcutaneous heparin and pain control as above.  We will hold off on a bowel regimen for now given the obstruction.  CODE STATUS:  He is full code as discussed with the patient.  The patient will be admitted to [facility], but an unclear team at this point.  I will figure this out.          ______________________________ Carlota Raspberry,  MD     EB/MEDQ  D:  10/15/2010  T:  10/15/2010  Job:  [id number]  Electronically Signed by Carlota Raspberry MD on 11/12/2010 12:18:56 PM

## 2010-11-21 ENCOUNTER — Encounter (HOSPITAL_BASED_OUTPATIENT_CLINIC_OR_DEPARTMENT_OTHER): Payer: Medicare Other | Admitting: Oncology

## 2010-11-21 DIAGNOSIS — C189 Malignant neoplasm of colon, unspecified: Secondary | ICD-10-CM

## 2010-11-21 DIAGNOSIS — R634 Abnormal weight loss: Secondary | ICD-10-CM

## 2010-11-25 ENCOUNTER — Ambulatory Visit (HOSPITAL_BASED_OUTPATIENT_CLINIC_OR_DEPARTMENT_OTHER)
Admission: RE | Admit: 2010-11-25 | Discharge: 2010-11-25 | Disposition: A | Discharge status: Home / Self Care | Payer: Medicare Other | Source: Ambulatory Visit | Attending: General Surgery | Admitting: General Surgery

## 2010-11-25 DIAGNOSIS — Z538 Procedure and treatment not carried out for other reasons: Secondary | ICD-10-CM | POA: Insufficient documentation

## 2010-11-25 DIAGNOSIS — C801 Malignant (primary) neoplasm, unspecified: Secondary | ICD-10-CM | POA: Insufficient documentation

## 2010-11-25 LAB — BASIC METABOLIC PANEL
CO2: 28 mEq/L (ref 19–32)
Chloride: 105 mEq/L (ref 96–112)
Creatinine, Ser: 1.09 mg/dL (ref 0.50–1.35)

## 2010-11-25 NOTE — Progress Notes (Signed)
Quick Note:  Labs ok for surgery ______ 

## 2010-11-27 ENCOUNTER — Telehealth (INDEPENDENT_AMBULATORY_CARE_PROVIDER_SITE_OTHER): Payer: Self-pay | Admitting: General Surgery

## 2010-11-27 ENCOUNTER — Ambulatory Visit (HOSPITAL_COMMUNITY): Payer: Medicare Other | Attending: General Surgery

## 2010-11-27 ENCOUNTER — Ambulatory Visit (HOSPITAL_BASED_OUTPATIENT_CLINIC_OR_DEPARTMENT_OTHER)
Admission: RE | Admit: 2010-11-27 | Discharge: 2010-11-27 | Disposition: A | Discharge status: Home / Self Care | Payer: Medicare Other | Source: Ambulatory Visit | Attending: General Surgery | Admitting: General Surgery

## 2010-11-27 DIAGNOSIS — C189 Malignant neoplasm of colon, unspecified: Secondary | ICD-10-CM | POA: Insufficient documentation

## 2010-11-27 DIAGNOSIS — Z01812 Encounter for preprocedural laboratory examination: Secondary | ICD-10-CM | POA: Insufficient documentation

## 2010-11-27 HISTORY — PX: PORTACATH PLACEMENT: SHX2246

## 2010-11-27 LAB — POCT HEMOGLOBIN-HEMACUE: Hemoglobin: 12.9 g/dL — ABNORMAL LOW (ref 13.0–17.0)

## 2010-11-27 NOTE — Telephone Encounter (Signed)
DR. Marilynne Halsted CALLED TO ASK IF I WOULD CONTACT [facility] RE EXTENDING HOME VISITS FOR Patrick Woodward APPROXIMATELY 5 MORE DAYS. NEXT OFFICE VISIT WITH DR. Luisa Hart IS 12-12-10.

## 2010-11-28 ENCOUNTER — Encounter (INDEPENDENT_AMBULATORY_CARE_PROVIDER_SITE_OTHER): Payer: Medicare Other | Admitting: Surgery

## 2010-11-30 ENCOUNTER — Other Ambulatory Visit: Payer: Self-pay | Admitting: Oncology

## 2010-11-30 DIAGNOSIS — C189 Malignant neoplasm of colon, unspecified: Secondary | ICD-10-CM | POA: Insufficient documentation

## 2010-12-01 ENCOUNTER — Encounter (HOSPITAL_BASED_OUTPATIENT_CLINIC_OR_DEPARTMENT_OTHER): Payer: Medicare Other | Admitting: Oncology

## 2010-12-01 ENCOUNTER — Other Ambulatory Visit: Payer: Self-pay | Admitting: Oncology

## 2010-12-01 DIAGNOSIS — Z5111 Encounter for antineoplastic chemotherapy: Secondary | ICD-10-CM

## 2010-12-01 DIAGNOSIS — R109 Unspecified abdominal pain: Secondary | ICD-10-CM

## 2010-12-01 DIAGNOSIS — K56609 Unspecified intestinal obstruction, unspecified as to partial versus complete obstruction: Secondary | ICD-10-CM

## 2010-12-01 DIAGNOSIS — C189 Malignant neoplasm of colon, unspecified: Secondary | ICD-10-CM

## 2010-12-01 DIAGNOSIS — C187 Malignant neoplasm of sigmoid colon: Secondary | ICD-10-CM

## 2010-12-01 LAB — CBC WITH DIFFERENTIAL/PLATELET
Basophils Absolute: 0 10*3/uL (ref 0.0–0.1)
HCT: 37.2 % — ABNORMAL LOW (ref 38.4–49.9)
HGB: 12.2 g/dL — ABNORMAL LOW (ref 13.0–17.1)
MCH: 27.6 pg (ref 27.2–33.4)
MONO#: 0.7 10*3/uL (ref 0.1–0.9)
NEUT%: 62.3 % (ref 39.0–75.0)
WBC: 7.2 10*3/uL (ref 4.0–10.3)
lymph#: 1.9 10*3/uL (ref 0.9–3.3)

## 2010-12-02 NOTE — Discharge Summary (Signed)
NAME:  Patrick Woodward, Patrick Woodward NO.:  [id number]  MEDICAL RECORD NO.:  [id number]  LOCATION:  1318                         FACILITY:  [facility]  PHYSICIAN:  Angelia Mould. Derrell Lolling, M.D.DATE OF BIRTH:  April 11, 1940  DATE OF ADMISSION:  10/14/2010 DATE OF DISCHARGE:  11/01/2010                              DISCHARGE SUMMARY   ADMISSION DIAGNOSES: 1. Abdominal pain with CT showing a large bowel obstruction and     probable 3 cm proximal sigmoid colon cancer. 2. Hypertension. 3. Dyslipidemia. 4. Gastroesophageal reflux disease. 5. History of colonoscopy approximately 5 years ago.  DISCHARGE DIAGNOSES: 1.  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     T3N1a.  2. Postoperative ileus. 3. Postoperative tachycardia, treated with beta-blockers. 4. Hypertension. 5. Dyslipidemia. 6. Gastroesophageal reflux disease.  PROCEDURES:1. Flexible sigmoidoscopy, October 28, 2010, Dr. Madilyn Fireman. 2. Partial colectomy with end colostomy and Hartmann procedure,     October 20, 2010, Dr. Harriette Bouillon. 3. PICC placement, October 23, 2010 and initiation of TNA.  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  He was in [place] and developed diffuse abdominal pain in the periumbilical area associated with nausea and vomiting.  He reported being constipated for about 2 days prior. There is no history of fever.  No history of chills or rigors.  He was seen and admitted in [place] and CT showed a 3 cm obstructive mass at the proximal sigmoid colon with dilated colon. HOSPITAL COURSE: The patient was  transferred to the hospitalist service in [place] and was admitted for further evaluation and treatment.  For further history and physical, please see the dictated note.  He was placed on NG drainage and treated and made n.p.o.  A consult with  Dr. Charlott Rakes was obtained on October 18, 2010.  He underwent flexible sigmoidoscopy.  He was seen on October 19, 2010 by Dr. Ovidio Kin for surgical consult.  It was his impression the patient had obstructing left colon/sigmoid lesion, probable carcinoma.  He ordered CEA levels and reported that he would need a colon resection and colostomy.  He was seen the following day on Monday, 10/20/10, by Dr. Harriette Bouillon who then recommended and took the patient to the OR for partial colectomy and colostomy and Hartmann procedure. The patient tolerated this procedure well and returned to the floor in satisfactory condition.  He has a fair amount of abdominal distention and tachycardia along with postoperative ileus.  This is treated with an NG tube and he was made n.p.o.  He eventually underwent PICC placement as noted above for TNA.  As his ileus resolved, his diet was slowly advanced with resolution of his ileus.  He showed good improvement and was ultimately ready for discharge on November 01, 2010.  Pathology asnoted above, showed an invasive moderately differentiated adenocarcinoma invading through the muscularis propria, 1 of 14 nodes were positive and it was labeled as T3N1a cancer.  He was to be followed up by Dr. Mancel Bale of Oncology and Dr. Harriette Bouillon.  Staples were removed.  He was tolerating a  full diet, weaned off TNA.  His colostomy is working well.  The incisions looked good.  He was given routine postoperative open abdominal surgery instructions.  DISCHARGE MEDICATIONS: 1. Hydrocodone/APAP 5/325 mg 1 to 2 p.o. q.4 h. p.r.n. 2. Ecotrin 1 daily. 3. Lisinopril 20 mg daily. 4. Multivitamins 1 daily. 5. Nexium 40 mg daily. 6. Toprol-XL 50 mg half tablet daily. 7. Vitamin B 400 mg daily. 8. Vytorin 10/40 mg 1 daily. Follow up with Dr. Andi Devon, primary care in 1 to 2 weeks.  He was called Dr. Luisa Hart and Dr. Marge Duncans office for followup.   [facility] also [facility] to help with his colostomy and postop care. Oncology follow up will be with Dr. Mancel Bale.     Eber Hong, P.A.   ______________________________ Angelia Mould. Derrell Lolling, M.D.    WDJ/MEDQ  D:  11/30/2010  T:  11/30/2010  Job:  [id number]  cc:   Merlene Laughter. Renae Gloss, M.D. Fax: [phone]  Shirley Friar, MD Fax: [phone]  Dr. Arnoldo Lenis  Electronically Signed by Sherrie Arty P.A. on 11/30/2010 03:27:55 PM Electronically Signed by Claud Kelp M.D. on 12/02/2010 06:51:18 AM

## 2010-12-07 ENCOUNTER — Encounter: Payer: Self-pay | Admitting: *Deleted

## 2010-12-12 ENCOUNTER — Ambulatory Visit (INDEPENDENT_AMBULATORY_CARE_PROVIDER_SITE_OTHER): Payer: Medicare Other | Admitting: Surgery

## 2010-12-12 ENCOUNTER — Encounter (INDEPENDENT_AMBULATORY_CARE_PROVIDER_SITE_OTHER): Payer: Self-pay | Admitting: Surgery

## 2010-12-12 VITALS — BP 122/68 | HR 66 | Temp 97.3°F | Resp 14 | Ht 77.5 in | Wt 174.2 lb

## 2010-12-12 DIAGNOSIS — Z9889 Other specified postprocedural states: Secondary | ICD-10-CM

## 2010-12-12 NOTE — Progress Notes (Signed)
The patient is 6 weeks status post left hemicolectomy for a T3 N1MX COLON CANCER.  He presented with obstruction.  He is doing well.   On exam:  Incision well healed.  Ostomy viable and funtioning. Impression : T3N1MX stage 3 colon cancer Plan:  Continue chemo.  Return once done to discuss closure of colostomy.

## 2010-12-12 NOTE — Patient Instructions (Signed)
Return after chemo.

## 2010-12-15 ENCOUNTER — Ambulatory Visit (HOSPITAL_BASED_OUTPATIENT_CLINIC_OR_DEPARTMENT_OTHER): Payer: Medicare Other | Admitting: Oncology

## 2010-12-15 ENCOUNTER — Telehealth: Payer: Self-pay | Admitting: Oncology

## 2010-12-15 ENCOUNTER — Other Ambulatory Visit (HOSPITAL_BASED_OUTPATIENT_CLINIC_OR_DEPARTMENT_OTHER): Payer: Medicare Other | Admitting: Lab

## 2010-12-15 ENCOUNTER — Other Ambulatory Visit: Payer: Self-pay | Admitting: Oncology

## 2010-12-15 ENCOUNTER — Ambulatory Visit (HOSPITAL_BASED_OUTPATIENT_CLINIC_OR_DEPARTMENT_OTHER): Payer: Medicare Other

## 2010-12-15 VITALS — BP 143/81 | HR 99 | Temp 98.4°F | Ht 77.0 in | Wt 173.3 lb

## 2010-12-15 DIAGNOSIS — C189 Malignant neoplasm of colon, unspecified: Secondary | ICD-10-CM

## 2010-12-15 DIAGNOSIS — Z5111 Encounter for antineoplastic chemotherapy: Secondary | ICD-10-CM

## 2010-12-15 LAB — COMPREHENSIVE METABOLIC PANEL
Albumin: 3.5 g/dL (ref 3.5–5.2)
Alkaline Phosphatase: 63 U/L (ref 39–117)
BUN: 7 mg/dL (ref 6–23)
Glucose, Bld: 93 mg/dL (ref 70–99)
Potassium: 3.9 mEq/L (ref 3.5–5.3)
Total Bilirubin: 0.4 mg/dL (ref 0.3–1.2)

## 2010-12-15 LAB — CBC WITH DIFFERENTIAL/PLATELET
Basophils Absolute: 0 10*3/uL (ref 0.0–0.1)
EOS%: 2.5 % (ref 0.0–7.0)
HCT: 39.1 % (ref 38.4–49.9)
HGB: 12.6 g/dL — ABNORMAL LOW (ref 13.0–17.1)
MCH: 27.3 pg (ref 27.2–33.4)
MCV: 84.8 fL (ref 79.3–98.0)
MONO%: 14 % (ref 0.0–14.0)
NEUT%: 44.7 % (ref 39.0–75.0)
Platelets: 156 10*3/uL (ref 140–400)

## 2010-12-15 MED ORDER — FLUOROURACIL CHEMO INJECTION 2.5 GM/50ML
400.0000 mg/m2 | Freq: Once | INTRAVENOUS | Status: AC
  Administered 2010-12-15: 800 mg via INTRAVENOUS
  Filled 2010-12-15: qty 16

## 2010-12-15 MED ORDER — SODIUM CHLORIDE 0.9 % IV SOLN
2400.0000 mg/m2 | INTRAVENOUS | Status: DC
  Administered 2010-12-15: 4800 mg via INTRAVENOUS
  Filled 2010-12-15: qty 96

## 2010-12-15 MED ORDER — OXALIPLATIN CHEMO INJECTION 100 MG/20ML
85.0000 mg/m2 | Freq: Once | INTRAVENOUS | Status: AC
  Administered 2010-12-15: 170 mg via INTRAVENOUS
  Filled 2010-12-15: qty 34

## 2010-12-15 MED ORDER — ONDANSETRON 8 MG/50ML IVPB (CHCC)
8.0000 mg | Freq: Once | INTRAVENOUS | Status: AC
  Administered 2010-12-15: 8 mg via INTRAVENOUS

## 2010-12-15 MED ORDER — DEXAMETHASONE SODIUM PHOSPHATE 10 MG/ML IJ SOLN
10.0000 mg | Freq: Once | INTRAMUSCULAR | Status: AC
  Administered 2010-12-15: 10 mg via INTRAVENOUS

## 2010-12-15 MED ORDER — LEUCOVORIN CALCIUM INJECTION 350 MG
400.0000 mg/m2 | Freq: Once | INTRAVENOUS | Status: AC
  Administered 2010-12-15: 800 mg via INTRAVENOUS
  Filled 2010-12-15: qty 40

## 2010-12-15 NOTE — Telephone Encounter (Signed)
gve the pt his nov,dec 2012 appt calendar °

## 2010-12-15 NOTE — Patient Instructions (Signed)
Pt d/c with ambulatory 5-fu pump.  Pump started at 5.31ml/hr x with total volume of 243 cc to infuse over 46 hours.  All connections secured with tape and pump in full lock mode.  Pt d/c ambulatory with next appointment confirmed.

## 2010-12-15 NOTE — Progress Notes (Signed)
OFFICE PROGRESS NOTE INTERVAL HISTORY: Patrick Woodward  returns as scheduled. He completed a first cycle of FOLFOX chemotherapy on October 22. He tolerated the chemotherapy well. He denies nausea, mouth sores, diarrhea, and neuropathy symptoms. There's been no problem with the Port-A-Cath.  Objective: Vital signs in last 24 hours: Filed Vitals:   12/15/10 1029  BP: 143/81  Pulse: 99  Temp: 98.4 F (36.9 C)     HEENT:mucous membranes moist, pharynx normal without lesions Resp: clear to auscultation bilaterally Cardio: regular rate and rhythm GI: normal findings: no masses palpable and soft, non-tender Extremities: extremities normal, atraumatic, no cyanosis or edema   Portacath/PICC-without erythema  Lab Results:  Basename 12/15/10 1004  WBC --  HGB 12.6*  HCT 39.1  PLT 156     BMET No results found for this basename: NA:2,K:2,CL:2,CO2:2,GLUCOSE:2,BUN:2,CREATININE:2,CALCIUM:2 in the last 72 hours  Studies/Results: No results found.  Medications: I have reviewed the patient's current medications.  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  -Status post cycle #1 of adjuvant FOLFOX chemotherapy 12/01/2010 #2-bowel obstruction secondary to the primary colon mass  #3- abdominal pain/constipation secondary to the descending/sigmoid colon mass #4- history of weight loss-improved #5 status post Port-A-Cath placement 11/27/2010 #6-disposition: Mr. Samek tolerated the first cycle of adjuvant chemotherapy without significant toxicity. The plan is to proceed with cycle #2 today. He has mild neutropenia. He will contact us for a fever or signs of infection. He is scheduled for an office visit and cycle #3 of chemotherapy in 2 weeks.       Trevonn Hallum BRADLEY 12/15/2010, 11:34 AM

## 2010-12-16 ENCOUNTER — Encounter: Payer: Self-pay | Admitting: Certified Registered Nurse Anesthetist

## 2010-12-17 ENCOUNTER — Ambulatory Visit (HOSPITAL_BASED_OUTPATIENT_CLINIC_OR_DEPARTMENT_OTHER): Payer: Medicare Other

## 2010-12-17 VITALS — BP 127/78 | HR 73 | Temp 97.9°F

## 2010-12-17 DIAGNOSIS — C189 Malignant neoplasm of colon, unspecified: Secondary | ICD-10-CM

## 2010-12-17 MED ORDER — SODIUM CHLORIDE 0.9 % IJ SOLN
10.0000 mL | INTRAMUSCULAR | Status: DC | PRN
  Administered 2010-12-17: 10 mL
  Filled 2010-12-17: qty 10

## 2010-12-17 MED ORDER — HEPARIN SOD (PORK) LOCK FLUSH 100 UNIT/ML IV SOLN
500.0000 [IU] | Freq: Once | INTRAVENOUS | Status: AC | PRN
  Administered 2010-12-17: 500 [IU]
  Filled 2010-12-17: qty 5

## 2010-12-28 ENCOUNTER — Other Ambulatory Visit: Payer: Self-pay | Admitting: Oncology

## 2010-12-29 ENCOUNTER — Other Ambulatory Visit: Payer: Self-pay | Admitting: Oncology

## 2010-12-29 ENCOUNTER — Other Ambulatory Visit (HOSPITAL_BASED_OUTPATIENT_CLINIC_OR_DEPARTMENT_OTHER): Payer: Medicare Other | Admitting: Lab

## 2010-12-29 ENCOUNTER — Other Ambulatory Visit: Payer: Self-pay | Admitting: Certified Registered Nurse Anesthetist

## 2010-12-29 ENCOUNTER — Ambulatory Visit (HOSPITAL_BASED_OUTPATIENT_CLINIC_OR_DEPARTMENT_OTHER): Payer: Medicare Other | Admitting: Oncology

## 2010-12-29 ENCOUNTER — Inpatient Hospital Stay: Payer: Medicare Other

## 2010-12-29 ENCOUNTER — Telehealth: Payer: Self-pay | Admitting: Oncology

## 2010-12-29 VITALS — BP 125/73 | HR 71 | Temp 96.9°F | Wt 177.0 lb

## 2010-12-29 DIAGNOSIS — C189 Malignant neoplasm of colon, unspecified: Secondary | ICD-10-CM

## 2010-12-29 DIAGNOSIS — K56609 Unspecified intestinal obstruction, unspecified as to partial versus complete obstruction: Secondary | ICD-10-CM

## 2010-12-29 DIAGNOSIS — D702 Other drug-induced agranulocytosis: Secondary | ICD-10-CM

## 2010-12-29 DIAGNOSIS — C187 Malignant neoplasm of sigmoid colon: Secondary | ICD-10-CM

## 2010-12-29 LAB — COMPREHENSIVE METABOLIC PANEL
BUN: 11 mg/dL (ref 6–23)
CO2: 29 mEq/L (ref 19–32)
Creatinine, Ser: 1.34 mg/dL (ref 0.50–1.35)
Glucose, Bld: 89 mg/dL (ref 70–99)
Total Bilirubin: 0.5 mg/dL (ref 0.3–1.2)
Total Protein: 6.7 g/dL (ref 6.0–8.3)

## 2010-12-29 LAB — CBC WITH DIFFERENTIAL/PLATELET
Basophils Absolute: 0 10*3/uL (ref 0.0–0.1)
Eosinophils Absolute: 0 10*3/uL (ref 0.0–0.5)
HCT: 37.5 % — ABNORMAL LOW (ref 38.4–49.9)
LYMPH%: 52.2 % — ABNORMAL HIGH (ref 14.0–49.0)
MCV: 84.5 fL (ref 79.3–98.0)
MONO#: 0.5 10*3/uL (ref 0.1–0.9)
MONO%: 20.4 % — ABNORMAL HIGH (ref 0.0–14.0)
NEUT#: 0.6 10*3/uL — ABNORMAL LOW (ref 1.5–6.5)
NEUT%: 24.8 % — ABNORMAL LOW (ref 39.0–75.0)
Platelets: 104 10*3/uL — ABNORMAL LOW (ref 140–400)
WBC: 2.3 10*3/uL — ABNORMAL LOW (ref 4.0–10.3)

## 2010-12-29 NOTE — Progress Notes (Signed)
OFFICE PROGRESS NOTE INTERVAL HISTORY: [name]  returns as scheduled. He completed another cycle of FOLFOX chemotherapy on November 5. He tolerated the chemotherapy well. He denies nausea, mouth sores, and diarrhea.he reports cold sensitivity lasting for approximately 5 days following chemotherapy. He had malaise on day 6. There's been no problem with the Port-A-Cath.  Objective: Vital signs in last 24 hours: Filed Vitals:   12/29/10 0944  BP: 125/73  Pulse: 71  Temp: 96.9 F (36.1 C)     HEENT:mucous membranes moist, pharynx normal without lesions Resp: clear to auscultation bilaterally Cardio: regular rate and rhythm GI: normal findings: no masses palpable and soft, non-tender Extremities: extremities normal, atraumatic, no cyanosis or edema Skin: There is skin thickening and dry desquamation at the soles. No palmar erythema or skin breakdown.   Portacath/PICC-without erythema  Lab Results:  Basename 12/29/10 0926  WBC 2.3*  HGB 12.4*  HCT 37.5*  PLT 104*   ANC-0.6   Medications: I have reviewed the patient's current medications.  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  -Status post cycle #1 of adjuvant FOLFOX chemotherapy 12/01/2010. He has completed 2 cycles to date. #2-bowel obstruction secondary to the primary colon mass  #3- abdominal pain/constipation secondary to the descending/sigmoid colon mass #4- history of weight loss-improved #5 status post Port-A-Cath placement 11/27/2010 #6-neutropenia secondary to chemotherapy, chemotherapy will be held today. He will receive Neulasta beginning with the third cycle of FOLFOX chemotherapy on November 26. I discussed potential toxicities associated with Neulasta including a chance for bone pain, a skin rash, and the rare rupture of the spleen. #7-disposition:  Mr. Schlageter has completed 2 cycles of adjuvant FOLFOX chemotherapy. Cycle 3 will be delayed until November 26. He will receive Neulasta beginning with cycle #3. He knows to contact us for a fever. He will return for an office visit and cycle #4 of FOLFOX on December 10.       Patrick Woodward 12/29/2010, 11:02 AM

## 2010-12-29 NOTE — Telephone Encounter (Signed)
gve the pt his nov,dec 2012 appt calendar

## 2011-01-02 ENCOUNTER — Encounter: Payer: Self-pay | Admitting: *Deleted

## 2011-01-05 ENCOUNTER — Other Ambulatory Visit (HOSPITAL_BASED_OUTPATIENT_CLINIC_OR_DEPARTMENT_OTHER): Payer: Medicare Other | Admitting: Lab

## 2011-01-05 ENCOUNTER — Other Ambulatory Visit: Payer: Self-pay | Admitting: Oncology

## 2011-01-05 ENCOUNTER — Ambulatory Visit (HOSPITAL_BASED_OUTPATIENT_CLINIC_OR_DEPARTMENT_OTHER): Payer: Medicare Other

## 2011-01-05 VITALS — BP 105/78 | HR 84 | Temp 97.7°F

## 2011-01-05 DIAGNOSIS — C189 Malignant neoplasm of colon, unspecified: Secondary | ICD-10-CM

## 2011-01-05 DIAGNOSIS — C187 Malignant neoplasm of sigmoid colon: Secondary | ICD-10-CM

## 2011-01-05 DIAGNOSIS — Z5111 Encounter for antineoplastic chemotherapy: Secondary | ICD-10-CM

## 2011-01-05 LAB — CBC WITH DIFFERENTIAL/PLATELET
Basophils Absolute: 0 10*3/uL (ref 0.0–0.1)
Eosinophils Absolute: 0 10*3/uL (ref 0.0–0.5)
HCT: 38.4 % (ref 38.4–49.9)
HGB: 12.8 g/dL — ABNORMAL LOW (ref 13.0–17.1)
LYMPH%: 31.2 % (ref 14.0–49.0)
MONO#: 1.2 10*3/uL — ABNORMAL HIGH (ref 0.1–0.9)
NEUT#: 1.9 10*3/uL (ref 1.5–6.5)
Platelets: 138 10*3/uL — ABNORMAL LOW (ref 140–400)
RBC: 4.52 10*6/uL (ref 4.20–5.82)
WBC: 4.5 10*3/uL (ref 4.0–10.3)
nRBC: 1 % — ABNORMAL HIGH (ref 0–0)

## 2011-01-05 MED ORDER — SODIUM CHLORIDE 0.9 % IV SOLN
2400.0000 mg/m2 | INTRAVENOUS | Status: DC
  Administered 2011-01-05: 4800 mg via INTRAVENOUS
  Filled 2011-01-05: qty 96

## 2011-01-05 MED ORDER — FLUOROURACIL CHEMO INJECTION 2.5 GM/50ML
400.0000 mg/m2 | Freq: Once | INTRAVENOUS | Status: AC
  Administered 2011-01-05: 800 mg via INTRAVENOUS
  Filled 2011-01-05: qty 16

## 2011-01-05 MED ORDER — OXALIPLATIN CHEMO INJECTION 100 MG/20ML
85.0000 mg/m2 | Freq: Once | INTRAVENOUS | Status: AC
  Administered 2011-01-05: 170 mg via INTRAVENOUS
  Filled 2011-01-05: qty 34

## 2011-01-05 MED ORDER — DEXAMETHASONE SODIUM PHOSPHATE 10 MG/ML IJ SOLN
10.0000 mg | Freq: Once | INTRAMUSCULAR | Status: AC
  Administered 2011-01-05: 10 mg via INTRAVENOUS

## 2011-01-05 MED ORDER — DEXTROSE 5 % IV SOLN
Freq: Once | INTRAVENOUS | Status: AC
  Administered 2011-01-05: 11:00:00 via INTRAVENOUS

## 2011-01-05 MED ORDER — LEUCOVORIN CALCIUM INJECTION 350 MG
400.0000 mg/m2 | Freq: Once | INTRAVENOUS | Status: AC
  Administered 2011-01-05: 800 mg via INTRAVENOUS
  Filled 2011-01-05: qty 40

## 2011-01-05 MED ORDER — ONDANSETRON 8 MG/50ML IVPB (CHCC)
8.0000 mg | Freq: Once | INTRAVENOUS | Status: AC
  Administered 2011-01-05: 8 mg via INTRAVENOUS

## 2011-01-07 ENCOUNTER — Ambulatory Visit (HOSPITAL_BASED_OUTPATIENT_CLINIC_OR_DEPARTMENT_OTHER): Payer: Medicare Other

## 2011-01-07 VITALS — BP 114/65 | HR 84 | Temp 98.0°F

## 2011-01-07 DIAGNOSIS — C189 Malignant neoplasm of colon, unspecified: Secondary | ICD-10-CM

## 2011-01-07 MED ORDER — SODIUM CHLORIDE 0.9 % IJ SOLN
10.0000 mL | INTRAMUSCULAR | Status: DC | PRN
  Administered 2011-01-07: 10 mL
  Filled 2011-01-07: qty 10

## 2011-01-07 MED ORDER — PEGFILGRASTIM INJECTION 6 MG/0.6ML
6.0000 mg | Freq: Once | SUBCUTANEOUS | Status: AC
  Administered 2011-01-07: 6 mg via SUBCUTANEOUS
  Filled 2011-01-07: qty 0.6

## 2011-01-07 MED ORDER — HEPARIN SOD (PORK) LOCK FLUSH 100 UNIT/ML IV SOLN
500.0000 [IU] | Freq: Once | INTRAVENOUS | Status: AC | PRN
  Administered 2011-01-07: 500 [IU]
  Filled 2011-01-07: qty 5

## 2011-01-07 NOTE — Patient Instructions (Signed)
Call MD for problems 

## 2011-01-12 ENCOUNTER — Inpatient Hospital Stay: Payer: Medicare Other

## 2011-01-12 ENCOUNTER — Other Ambulatory Visit: Payer: Medicare Other | Admitting: Lab

## 2011-01-12 ENCOUNTER — Ambulatory Visit: Payer: Medicare Other | Admitting: Nurse Practitioner

## 2011-01-18 ENCOUNTER — Other Ambulatory Visit: Payer: Self-pay | Admitting: Oncology

## 2011-01-19 ENCOUNTER — Other Ambulatory Visit: Payer: Self-pay | Admitting: Nurse Practitioner

## 2011-01-19 ENCOUNTER — Ambulatory Visit (HOSPITAL_BASED_OUTPATIENT_CLINIC_OR_DEPARTMENT_OTHER): Payer: Medicare Other

## 2011-01-19 ENCOUNTER — Ambulatory Visit (HOSPITAL_BASED_OUTPATIENT_CLINIC_OR_DEPARTMENT_OTHER): Payer: Medicare Other | Admitting: Nurse Practitioner

## 2011-01-19 ENCOUNTER — Other Ambulatory Visit (HOSPITAL_BASED_OUTPATIENT_CLINIC_OR_DEPARTMENT_OTHER): Payer: Medicare Other | Admitting: Lab

## 2011-01-19 VITALS — BP 113/70 | HR 95 | Temp 97.6°F | Wt 181.8 lb

## 2011-01-19 DIAGNOSIS — Z5111 Encounter for antineoplastic chemotherapy: Secondary | ICD-10-CM

## 2011-01-19 DIAGNOSIS — C189 Malignant neoplasm of colon, unspecified: Secondary | ICD-10-CM

## 2011-01-19 DIAGNOSIS — M549 Dorsalgia, unspecified: Secondary | ICD-10-CM

## 2011-01-19 LAB — CBC WITH DIFFERENTIAL/PLATELET
Basophils Absolute: 0 10*3/uL (ref 0.0–0.1)
Eosinophils Absolute: 0.1 10*3/uL (ref 0.0–0.5)
HCT: 34.9 % — ABNORMAL LOW (ref 38.4–49.9)
HGB: 11.9 g/dL — ABNORMAL LOW (ref 13.0–17.1)
LYMPH%: 9.7 % — ABNORMAL LOW (ref 14.0–49.0)
MCV: 86.4 fL (ref 79.3–98.0)
MONO#: 0.9 10*3/uL (ref 0.1–0.9)
MONO%: 5.7 % (ref 0.0–14.0)
NEUT#: 13.8 10*3/uL — ABNORMAL HIGH (ref 1.5–6.5)
Platelets: 102 10*3/uL — ABNORMAL LOW (ref 140–400)
WBC: 16.4 10*3/uL — ABNORMAL HIGH (ref 4.0–10.3)

## 2011-01-19 LAB — COMPREHENSIVE METABOLIC PANEL
Alkaline Phosphatase: 104 U/L (ref 39–117)
BUN: 11 mg/dL (ref 6–23)
Creatinine, Ser: 1.48 mg/dL — ABNORMAL HIGH (ref 0.50–1.35)
Glucose, Bld: 95 mg/dL (ref 70–99)
Sodium: 139 mEq/L (ref 135–145)
Total Bilirubin: 0.3 mg/dL (ref 0.3–1.2)

## 2011-01-19 MED ORDER — DEXTROSE 5 % IV SOLN
Freq: Once | INTRAVENOUS | Status: AC
  Administered 2011-01-19: 10:00:00 via INTRAVENOUS

## 2011-01-19 MED ORDER — FLUOROURACIL CHEMO INJECTION 2.5 GM/50ML
400.0000 mg/m2 | Freq: Once | INTRAVENOUS | Status: AC
  Administered 2011-01-19: 800 mg via INTRAVENOUS
  Filled 2011-01-19: qty 16

## 2011-01-19 MED ORDER — DEXAMETHASONE SODIUM PHOSPHATE 10 MG/ML IJ SOLN
10.0000 mg | Freq: Once | INTRAMUSCULAR | Status: AC
  Administered 2011-01-19: 10 mg via INTRAVENOUS

## 2011-01-19 MED ORDER — ONDANSETRON 8 MG/50ML IVPB (CHCC)
8.0000 mg | Freq: Once | INTRAVENOUS | Status: AC
  Administered 2011-01-19: 8 mg via INTRAVENOUS

## 2011-01-19 MED ORDER — OXALIPLATIN CHEMO INJECTION 100 MG/20ML
85.0000 mg/m2 | Freq: Once | INTRAVENOUS | Status: AC
  Administered 2011-01-19: 170 mg via INTRAVENOUS
  Filled 2011-01-19: qty 34

## 2011-01-19 MED ORDER — FLUOROURACIL CHEMO INJECTION 5 GM/100ML
2400.0000 mg/m2 | INTRAVENOUS | Status: DC
  Administered 2011-01-19: 4800 mg via INTRAVENOUS
  Filled 2011-01-19: qty 96

## 2011-01-19 MED ORDER — LEUCOVORIN CALCIUM INJECTION 350 MG
400.0000 mg/m2 | Freq: Once | INTRAVENOUS | Status: AC
  Administered 2011-01-19: 800 mg via INTRAVENOUS
  Filled 2011-01-19: qty 40

## 2011-01-19 NOTE — Patient Instructions (Signed)
1400-Pt discharged ambulatory with next appointment confirmed.  Pt aware to call with any questions or concerns.

## 2011-01-19 NOTE — Progress Notes (Signed)
OFFICE PROGRESS NOTE  Interval history:  [name] returns as scheduled. He completed cycle #3 of FOLFOX 01/05/2011. He had a single episode of nausea/vomiting. No mouth sores. No diarrhea. Cold sensitivity lasting 3-4 days. He denies persistent neuropathy symptoms. He noted aching discomfort in his back following the Neulasta injection.   Objective: Blood pressure 113/70, pulse 95, temperature 97.6 F (36.4 C), temperature source Oral, weight 181 lb 12.8 oz (82.464 kg).  Oropharynx is without thrush or ulceration. Lungs are clear. No wheezes or rales. Regular cardiac rhythm. Port-A-Cath site is without erythema. Abdomen is soft and nontender. No hepatomegaly. Extremities are without edema. Vibratory sense is moderately decreased over the fingertips per tuning fork exam.   Lab Results: Lab Results  Component Value Date   WBC 16.4* 01/19/2011   HGB 11.9* 01/19/2011   HCT 34.9* 01/19/2011   MCV 86.4 01/19/2011   PLT 102* 01/19/2011    Chemistry:      Component Value Date/Time   NA 139 12/29/2010 0926   K 4.1 12/29/2010 0926   CL 103 12/29/2010 0926   CO2 29 12/29/2010 0926   GLUCOSE 89 12/29/2010 0926   BUN 11 12/29/2010 0926   CREATININE 1.34 12/29/2010 0926   CALCIUM 9.2 12/29/2010 0926   PROT 6.7 12/29/2010 0926   ALBUMIN 3.7 12/29/2010 0926   AST 20 12/29/2010 0926   ALT 14 12/29/2010 0926   ALKPHOS 62 12/29/2010 0926   BILITOT 0.5 12/29/2010 0926   GFRNONAA 68* 11/25/2010 1430   GFRAA 79* 11/25/2010 1430     Studies/Results: No results found.  Medications: I have reviewed the patient's current medications.  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 Status post cycle #1 of adjuvant FOLFOX chemotherapy 12/01/2010. He completed cycle 2 beginning 12/15/2010. He completed cycle 3 beginning 01/05/2011 with  Neulasta support. #2-Bowel obstruction secondary to the primary colon mass  #3- Abdominal pain/constipation secondary to the descending/sigmoid colon mass  #4-History of weight loss-improved  #5-Status post Port-A-Cath placement 11/27/2010  #6-History of neutropenia secondary to chemotherapy. Cycle 3 FOLFOX was held 12/29/2010 due to neutropenia. He received Neulasta with cycle #3 FOLFOX chemotherapy on November 26.  #7-Back pain following cycle 3 FOLFOX-likely related to Neulasta. #7-Disposition: [name] appears stable. He has completed 3 cycles of adjuvant FOLFOX chemotherapy. Plan to proceed with cycle 4 FOLFOX today as scheduled. He will not receive Neulasta with cycle 4. He will return for a followup visit and cycle 5 of FOLFOX 02/02/2011. He will contact the office in the interim with any problems.  Plan reviewed with Dr. Truett Perna.    Lonna Cobb ANP/GNP-BC

## 2011-01-21 ENCOUNTER — Other Ambulatory Visit: Payer: Self-pay | Admitting: Nurse Practitioner

## 2011-01-21 ENCOUNTER — Ambulatory Visit (HOSPITAL_BASED_OUTPATIENT_CLINIC_OR_DEPARTMENT_OTHER): Payer: Medicare Other

## 2011-01-21 VITALS — BP 126/71 | HR 77 | Temp 97.5°F

## 2011-01-21 DIAGNOSIS — C189 Malignant neoplasm of colon, unspecified: Secondary | ICD-10-CM

## 2011-01-21 DIAGNOSIS — Z469 Encounter for fitting and adjustment of unspecified device: Secondary | ICD-10-CM

## 2011-01-21 MED ORDER — PEGFILGRASTIM INJECTION 6 MG/0.6ML
6.0000 mg | Freq: Once | SUBCUTANEOUS | Status: DC
  Filled 2011-01-21: qty 0.6

## 2011-01-21 MED ORDER — SODIUM CHLORIDE 0.9 % IJ SOLN
10.0000 mL | INTRAMUSCULAR | Status: DC | PRN
  Administered 2011-01-21: 10 mL
  Filled 2011-01-21: qty 10

## 2011-01-21 MED ORDER — HEPARIN SOD (PORK) LOCK FLUSH 100 UNIT/ML IV SOLN
500.0000 [IU] | Freq: Once | INTRAVENOUS | Status: AC | PRN
  Administered 2011-01-21: 500 [IU]
  Filled 2011-01-21: qty 5

## 2011-01-21 NOTE — Patient Instructions (Signed)
Call MD for problems 

## 2011-01-21 NOTE — Progress Notes (Signed)
01-21-11 Neulasta not give as planned per Dr. Truett Perna

## 2011-02-02 ENCOUNTER — Other Ambulatory Visit: Payer: Self-pay | Admitting: *Deleted

## 2011-02-02 ENCOUNTER — Telehealth: Payer: Self-pay | Admitting: Oncology

## 2011-02-02 ENCOUNTER — Other Ambulatory Visit (HOSPITAL_BASED_OUTPATIENT_CLINIC_OR_DEPARTMENT_OTHER): Payer: Medicare Other | Admitting: Lab

## 2011-02-02 ENCOUNTER — Other Ambulatory Visit: Payer: Self-pay | Admitting: Oncology

## 2011-02-02 ENCOUNTER — Ambulatory Visit (HOSPITAL_BASED_OUTPATIENT_CLINIC_OR_DEPARTMENT_OTHER): Payer: Medicare Other | Admitting: Nurse Practitioner

## 2011-02-02 ENCOUNTER — Ambulatory Visit: Payer: Medicare Other

## 2011-02-02 VITALS — BP 130/74 | HR 90 | Temp 97.0°F | Ht 77.0 in | Wt 184.0 lb

## 2011-02-02 DIAGNOSIS — Z79899 Other long term (current) drug therapy: Secondary | ICD-10-CM

## 2011-02-02 DIAGNOSIS — R634 Abnormal weight loss: Secondary | ICD-10-CM

## 2011-02-02 DIAGNOSIS — C189 Malignant neoplasm of colon, unspecified: Secondary | ICD-10-CM

## 2011-02-02 DIAGNOSIS — K56609 Unspecified intestinal obstruction, unspecified as to partial versus complete obstruction: Secondary | ICD-10-CM

## 2011-02-02 LAB — CBC WITH DIFFERENTIAL/PLATELET
Basophils Absolute: 0 10*3/uL (ref 0.0–0.1)
EOS%: 1.1 % (ref 0.0–7.0)
Eosinophils Absolute: 0 10*3/uL (ref 0.0–0.5)
HCT: 34.9 % — ABNORMAL LOW (ref 38.4–49.9)
HGB: 11.5 g/dL — ABNORMAL LOW (ref 13.0–17.1)
MCH: 28.1 pg (ref 27.2–33.4)
MONO#: 0.8 10*3/uL (ref 0.1–0.9)
NEUT#: 1 10*3/uL — ABNORMAL LOW (ref 1.5–6.5)
NEUT%: 34.7 % — ABNORMAL LOW (ref 39.0–75.0)
RDW: 17 % — ABNORMAL HIGH (ref 11.0–14.6)
lymph#: 1 10*3/uL (ref 0.9–3.3)

## 2011-02-02 NOTE — Progress Notes (Signed)
OFFICE PROGRESS NOTE  Interval history:  Patrick Woodward returns as scheduled. He completed cycle #4 of FOLFOX 01/19/2011. He had no nausea or vomiting. He denies diarrhea. He had a sore throat for a few days. He denies mouth ulcers. He noted cold sensitivity for approximately 3 days. He denies persistent neuropathy symptoms. He reports the skin around the stoma is irritated.  Objective: Blood pressure 130/74, pulse 90, temperature 97 F (36.1 C), temperature source Oral, height 6\' 5"  (1.956 m), weight 184 lb (83.462 kg).  Oropharynx is without thrush or ulceration. Lungs are clear. No wheezes or rales. Regular cardiac rhythm. Port-A-Cath site is without erythema. Abdomen is soft and nontender. No hepatomegaly. Left lower quadrant ostomy. Extremities are without edema. Vibratory sense is moderately decreased over the fingertips per tuning fork exam.   Lab Results: Lab Results  Component Value Date   WBC 2.8* 02/02/2011   HGB 11.5* 02/02/2011   HCT 34.9* 02/02/2011   MCV 85.3 02/02/2011   PLT 86* 02/02/2011    Chemistry:      Component Value Date/Time   NA 139 01/19/2011 0843   K 3.7 01/19/2011 0843   CL 104 01/19/2011 0843   CO2 28 01/19/2011 0843   GLUCOSE 95 01/19/2011 0843   BUN 11 01/19/2011 0843   CREATININE 1.48* 01/19/2011 0843   CALCIUM 9.1 01/19/2011 0843   PROT 6.9 01/19/2011 0843   ALBUMIN 3.7 01/19/2011 0843   AST 27 01/19/2011 0843   ALT 20 01/19/2011 0843   ALKPHOS 104 01/19/2011 0843   BILITOT 0.3 01/19/2011 0843   GFRNONAA 68* 11/25/2010 1430   GFRAA 79* 11/25/2010 1430     Studies/Results: No results found.  Medications: I have reviewed the patient's current medications.  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 Status post cycle #1 of adjuvant FOLFOX chemotherapy 12/01/2010. He completed cycle 2  beginning 12/15/2010. He completed cycle 3 beginning 01/05/2011 with Neulasta support. He completed cycle 4 01/19/2011. #2-Bowel obstruction secondary to the primary colon mass.  #3- Abdominal pain/constipation secondary to the descending/sigmoid colon mass.  #4-History of weight loss-improved.  #5-Status post Port-A-Cath placement 11/27/2010.  #6-History of neutropenia secondary to chemotherapy. Cycle 3 FOLFOX was held 12/29/2010 due to neutropenia. He received Neulasta with cycle #3 FOLFOX chemotherapy on November 26. He did not receive Neulasta following cycle 4. He is neutropenic on today's labs. #7-Thrombocytopenia secondary to chemotherapy. #8-Back pain following cycle 3 FOLFOX-likely related to Neulasta. #9-Disposition: Mr. Gill appears stable. He has completed 4 cycles of adjuvant FOLFOX chemotherapy. Cycle 5 will be held today due to neutropenia and thrombocytopenia and rescheduled for one week. He will return for a followup visit and cycle 6 FOLFOX 02/23/2011. He will contact the office in the interim with any problems. We specifically discussed fever or other signs of infection.   Plan reviewed with Dr. Truett Perna.    Patrick Woodward ANP/GNP-BC

## 2011-02-02 NOTE — Telephone Encounter (Signed)
gve the pt his dec,jan ,2013 appt calendar

## 2011-02-09 ENCOUNTER — Ambulatory Visit (HOSPITAL_BASED_OUTPATIENT_CLINIC_OR_DEPARTMENT_OTHER): Payer: Medicare Other

## 2011-02-09 ENCOUNTER — Other Ambulatory Visit (HOSPITAL_BASED_OUTPATIENT_CLINIC_OR_DEPARTMENT_OTHER): Payer: Medicare Other | Admitting: Lab

## 2011-02-09 VITALS — BP 117/61 | HR 83 | Temp 97.9°F

## 2011-02-09 DIAGNOSIS — Z5111 Encounter for antineoplastic chemotherapy: Secondary | ICD-10-CM

## 2011-02-09 DIAGNOSIS — C189 Malignant neoplasm of colon, unspecified: Secondary | ICD-10-CM

## 2011-02-09 LAB — COMPREHENSIVE METABOLIC PANEL
ALT: 28 U/L (ref 0–53)
AST: 35 U/L (ref 0–37)
Alkaline Phosphatase: 76 U/L (ref 39–117)
Sodium: 138 mEq/L (ref 135–145)
Total Bilirubin: 0.4 mg/dL (ref 0.3–1.2)
Total Protein: 7.1 g/dL (ref 6.0–8.3)

## 2011-02-09 LAB — CBC WITH DIFFERENTIAL/PLATELET
BASO%: 0.7 % (ref 0.0–2.0)
EOS%: 2 % (ref 0.0–7.0)
Eosinophils Absolute: 0.1 10*3/uL (ref 0.0–0.5)
MCH: 28.7 pg (ref 27.2–33.4)
MCHC: 33.2 g/dL (ref 32.0–36.0)
MCV: 86.6 fL (ref 79.3–98.0)
MONO%: 26.4 % — ABNORMAL HIGH (ref 0.0–14.0)
NEUT#: 2.5 10*3/uL (ref 1.5–6.5)
RBC: 4.56 10*6/uL (ref 4.20–5.82)
RDW: 16.9 % — ABNORMAL HIGH (ref 11.0–14.6)
nRBC: 0 % (ref 0–0)

## 2011-02-09 MED ORDER — FLUOROURACIL CHEMO INJECTION 2.5 GM/50ML
400.0000 mg/m2 | Freq: Once | INTRAVENOUS | Status: AC
  Administered 2011-02-09: 800 mg via INTRAVENOUS
  Filled 2011-02-09: qty 16

## 2011-02-09 MED ORDER — ONDANSETRON 8 MG/50ML IVPB (CHCC)
8.0000 mg | Freq: Once | INTRAVENOUS | Status: AC
  Administered 2011-02-09: 8 mg via INTRAVENOUS

## 2011-02-09 MED ORDER — SODIUM CHLORIDE 0.9 % IV SOLN
2400.0000 mg/m2 | INTRAVENOUS | Status: DC
  Administered 2011-02-09: 4800 mg via INTRAVENOUS
  Filled 2011-02-09: qty 96

## 2011-02-09 MED ORDER — LEUCOVORIN CALCIUM INJECTION 350 MG
400.0000 mg/m2 | Freq: Once | INTRAVENOUS | Status: AC
  Administered 2011-02-09: 800 mg via INTRAVENOUS
  Filled 2011-02-09: qty 40

## 2011-02-09 MED ORDER — OXALIPLATIN CHEMO INJECTION 100 MG/20ML
85.0000 mg/m2 | Freq: Once | INTRAVENOUS | Status: AC
  Administered 2011-02-09: 170 mg via INTRAVENOUS
  Filled 2011-02-09: qty 34

## 2011-02-09 MED ORDER — DEXAMETHASONE SODIUM PHOSPHATE 10 MG/ML IJ SOLN
10.0000 mg | Freq: Once | INTRAMUSCULAR | Status: AC
  Administered 2011-02-09: 10 mg via INTRAVENOUS

## 2011-02-09 NOTE — Patient Instructions (Signed)
Patient discharged home with wife; aware of next appointment for pump d/c.

## 2011-02-09 NOTE — Progress Notes (Signed)
Positive blood return every 3cc 5-FU push noted; paitient tolerated well.

## 2011-02-11 ENCOUNTER — Ambulatory Visit (HOSPITAL_BASED_OUTPATIENT_CLINIC_OR_DEPARTMENT_OTHER): Payer: Medicare Other

## 2011-02-11 DIAGNOSIS — C189 Malignant neoplasm of colon, unspecified: Secondary | ICD-10-CM

## 2011-02-11 DIAGNOSIS — C187 Malignant neoplasm of sigmoid colon: Secondary | ICD-10-CM

## 2011-02-11 MED ORDER — HEPARIN SOD (PORK) LOCK FLUSH 100 UNIT/ML IV SOLN
500.0000 [IU] | Freq: Once | INTRAVENOUS | Status: AC
  Administered 2011-02-11: 500 [IU] via INTRAVENOUS
  Filled 2011-02-11: qty 5

## 2011-02-11 MED ORDER — PEGFILGRASTIM INJECTION 6 MG/0.6ML
6.0000 mg | Freq: Once | SUBCUTANEOUS | Status: AC
  Administered 2011-02-11: 6 mg via SUBCUTANEOUS
  Filled 2011-02-11: qty 0.6

## 2011-02-11 MED ORDER — SODIUM CHLORIDE 0.9 % IJ SOLN
10.0000 mL | INTRAMUSCULAR | Status: DC | PRN
  Administered 2011-02-11: 10 mL via INTRAVENOUS
  Filled 2011-02-11: qty 10

## 2011-02-22 ENCOUNTER — Other Ambulatory Visit: Payer: Self-pay | Admitting: Oncology

## 2011-02-23 ENCOUNTER — Ambulatory Visit (HOSPITAL_BASED_OUTPATIENT_CLINIC_OR_DEPARTMENT_OTHER): Payer: Medicare Other

## 2011-02-23 ENCOUNTER — Telehealth: Payer: Self-pay | Admitting: Oncology

## 2011-02-23 ENCOUNTER — Ambulatory Visit (HOSPITAL_BASED_OUTPATIENT_CLINIC_OR_DEPARTMENT_OTHER): Payer: Medicare Other | Admitting: Nurse Practitioner

## 2011-02-23 ENCOUNTER — Other Ambulatory Visit: Payer: Medicare Other | Admitting: Lab

## 2011-02-23 VITALS — BP 118/71 | HR 83 | Temp 97.1°F | Ht 77.0 in | Wt 185.6 lb

## 2011-02-23 DIAGNOSIS — R109 Unspecified abdominal pain: Secondary | ICD-10-CM

## 2011-02-23 DIAGNOSIS — K56609 Unspecified intestinal obstruction, unspecified as to partial versus complete obstruction: Secondary | ICD-10-CM

## 2011-02-23 DIAGNOSIS — K5900 Constipation, unspecified: Secondary | ICD-10-CM

## 2011-02-23 DIAGNOSIS — Z5111 Encounter for antineoplastic chemotherapy: Secondary | ICD-10-CM

## 2011-02-23 DIAGNOSIS — Z23 Encounter for immunization: Secondary | ICD-10-CM

## 2011-02-23 DIAGNOSIS — C189 Malignant neoplasm of colon, unspecified: Secondary | ICD-10-CM

## 2011-02-23 LAB — CBC WITH DIFFERENTIAL/PLATELET
BASO%: 0.5 % (ref 0.0–2.0)
EOS%: 0.9 % (ref 0.0–7.0)
LYMPH%: 26.6 % (ref 14.0–49.0)
MCH: 28.3 pg (ref 27.2–33.4)
MCHC: 33.2 g/dL (ref 32.0–36.0)
MCV: 85.2 fL (ref 79.3–98.0)
MONO%: 16 % — ABNORMAL HIGH (ref 0.0–14.0)
Platelets: 95 10*3/uL — ABNORMAL LOW (ref 140–400)
RBC: 4.53 10*6/uL (ref 4.20–5.82)
nRBC: 0 % (ref 0–0)

## 2011-02-23 LAB — COMPREHENSIVE METABOLIC PANEL
ALT: 17 U/L (ref 0–53)
AST: 24 U/L (ref 0–37)
Alkaline Phosphatase: 91 U/L (ref 39–117)
CO2: 27 mEq/L (ref 19–32)
Creatinine, Ser: 1.48 mg/dL — ABNORMAL HIGH (ref 0.50–1.35)
Total Bilirubin: 0.3 mg/dL (ref 0.3–1.2)

## 2011-02-23 MED ORDER — ONDANSETRON 8 MG/50ML IVPB (CHCC)
8.0000 mg | Freq: Once | INTRAVENOUS | Status: AC
  Administered 2011-02-23: 8 mg via INTRAVENOUS

## 2011-02-23 MED ORDER — INFLUENZA VIRUS VACC SPLIT PF IM SUSP
0.5000 mL | Freq: Once | INTRAMUSCULAR | Status: DC
  Administered 2011-02-23: 0.5 mL via INTRAMUSCULAR
  Filled 2011-02-23: qty 0.5

## 2011-02-23 MED ORDER — OXALIPLATIN CHEMO INJECTION 100 MG/20ML
85.0000 mg/m2 | Freq: Once | INTRAVENOUS | Status: AC
  Administered 2011-02-23: 170 mg via INTRAVENOUS
  Filled 2011-02-23: qty 34

## 2011-02-23 MED ORDER — DEXAMETHASONE SODIUM PHOSPHATE 10 MG/ML IJ SOLN
10.0000 mg | Freq: Once | INTRAMUSCULAR | Status: AC
  Administered 2011-02-23: 10 mg via INTRAVENOUS

## 2011-02-23 MED ORDER — LEUCOVORIN CALCIUM INJECTION 350 MG
400.0000 mg/m2 | Freq: Once | INTRAVENOUS | Status: AC
  Administered 2011-02-23: 800 mg via INTRAVENOUS
  Filled 2011-02-23: qty 40

## 2011-02-23 MED ORDER — FLUOROURACIL CHEMO INJECTION 2.5 GM/50ML
400.0000 mg/m2 | Freq: Once | INTRAVENOUS | Status: AC
  Administered 2011-02-23: 800 mg via INTRAVENOUS
  Filled 2011-02-23: qty 16

## 2011-02-23 MED ORDER — DEXTROSE 5 % IV SOLN
Freq: Once | INTRAVENOUS | Status: AC
  Administered 2011-02-23: 11:00:00 via INTRAVENOUS

## 2011-02-23 MED ORDER — SODIUM CHLORIDE 0.9 % IV SOLN
2400.0000 mg/m2 | INTRAVENOUS | Status: DC
  Administered 2011-02-23: 4800 mg via INTRAVENOUS
  Filled 2011-02-23: qty 96

## 2011-02-23 NOTE — Progress Notes (Signed)
OFFICE PROGRESS NOTE  Interval history:  Patrick Woodward returns as scheduled. He completed cycle 5 FOLFOX 02/09/2011. Cycle 5 was originally scheduled for 02/02/2011 and was held for one week due to an absolute neutrophil count of 1.0.  Patrick Woodward reports that overall he feels well. He is intermittently tired. He denies nausea/vomiting. No mouth sores. No diarrhea. He notes cold sensitivity lasting for approximately 3-4 days. He denies persistent neuropathy symptoms. He has a good appetite.   Objective: Blood pressure 118/71, pulse 83, temperature 97.1 F (36.2 C), temperature source Oral, height 6\' 5"  (1.956 m), weight 185 lb 9.6 oz (84.188 kg).  Oropharynx is without thrush or ulceration. Lungs are clear. Regular cardiac rhythm. Port-A-Cath site is without erythema. Abdomen is soft and nontender. No hepatomegaly. Extremities are without edema. Calves are soft and nontender. Vibratory sense is moderately decreased over the fingertips per tuning fork exam  Lab Results: Lab Results  Component Value Date   WBC 5.6 02/23/2011   HGB 12.8* 02/23/2011   HCT 38.6 02/23/2011   MCV 85.2 02/23/2011   PLT 95* 02/23/2011    Chemistry:    Chemistry      Component Value Date/Time   NA 138 02/09/2011 0813   K 4.3 02/09/2011 0813   CL 101 02/09/2011 0813   CO2 28 02/09/2011 0813   BUN 14 02/09/2011 0813   CREATININE 1.36* 02/09/2011 0813      Component Value Date/Time   CALCIUM 9.0 02/09/2011 0813   ALKPHOS 76 02/09/2011 0813   AST 35 02/09/2011 0813   ALT 28 02/09/2011 0813   BILITOT 0.4 02/09/2011 0813       Studies/Results: No results found.  Medications: I have reviewed the patient's current medications.  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 Status post cycle #1 of adjuvant FOLFOX chemotherapy 12/01/2010. He completed cycle  2 beginning 12/15/2010. He completed cycle 3 beginning 01/05/2011 with Neulasta support. He completed cycle 4 01/19/2011. He completed cycle 5 02/09/2011. #2-Bowel obstruction secondary to the primary colon mass.  #3- Abdominal pain/constipation secondary to the descending/sigmoid colon mass.  #4-History of weight loss-improved.  #5-Status post Port-A-Cath placement 11/27/2010.  #6-History of neutropenia secondary to chemotherapy. Cycle 3 FOLFOX was held 12/29/2010 due to neutropenia. He received Neulasta with cycle #3 FOLFOX chemotherapy on November 26. He did not receive Neulasta following cycle 4. Cycle 5 was held for one week due to neutropenia. He received Neulasta support following cycle #5. #7-Thrombocytopenia secondary to chemotherapy. He will contact the office with bruising/bleeding. #8-Back pain following cycle 3 FOLFOX-likely related to Neulasta.  #9-Disposition: Patrick Woodward appears stable. He has completed 5 cycles of adjuvant FOLFOX chemotherapy. Plan to proceed with cycle 6 FOLFOX today as scheduled. He will again receive Neulasta support. He will return for a followup visit and cycle 7 of FOLFOX in 2 weeks. He will contact the office the interim as outlined above or with any other problems.  Plan reviewed with Dr. Truett Perna.  Lonna Cobb ANP/GNP-BC

## 2011-02-23 NOTE — Telephone Encounter (Signed)
PTS APPT MADE FOR 1/28 AND PRINTED  AOM

## 2011-02-25 ENCOUNTER — Ambulatory Visit (HOSPITAL_BASED_OUTPATIENT_CLINIC_OR_DEPARTMENT_OTHER): Payer: Medicare Other

## 2011-02-25 DIAGNOSIS — Z5189 Encounter for other specified aftercare: Secondary | ICD-10-CM

## 2011-02-25 DIAGNOSIS — C189 Malignant neoplasm of colon, unspecified: Secondary | ICD-10-CM

## 2011-02-25 MED ORDER — PEGFILGRASTIM INJECTION 6 MG/0.6ML
6.0000 mg | Freq: Once | SUBCUTANEOUS | Status: AC
  Administered 2011-02-25: 6 mg via SUBCUTANEOUS
  Filled 2011-02-25: qty 0.6

## 2011-02-25 MED ORDER — SODIUM CHLORIDE 0.9 % IJ SOLN
10.0000 mL | INTRAMUSCULAR | Status: DC | PRN
  Administered 2011-02-25: 10 mL
  Filled 2011-02-25: qty 10

## 2011-02-25 MED ORDER — HEPARIN SOD (PORK) LOCK FLUSH 100 UNIT/ML IV SOLN
500.0000 [IU] | Freq: Once | INTRAVENOUS | Status: AC
  Administered 2011-02-25: 500 [IU]
  Filled 2011-02-25: qty 5

## 2011-03-03 ENCOUNTER — Telehealth: Payer: Self-pay | Admitting: *Deleted

## 2011-03-03 NOTE — Telephone Encounter (Signed)
Spoke with Jacki Cones, RN (ostomy nurse) regarding issues patient is having with irritation around his stoma. She will call him to follow up.

## 2011-03-08 ENCOUNTER — Other Ambulatory Visit: Payer: Self-pay | Admitting: Oncology

## 2011-03-09 ENCOUNTER — Other Ambulatory Visit: Payer: Self-pay | Admitting: *Deleted

## 2011-03-09 ENCOUNTER — Other Ambulatory Visit: Payer: Medicare Other | Admitting: Lab

## 2011-03-09 ENCOUNTER — Ambulatory Visit (HOSPITAL_BASED_OUTPATIENT_CLINIC_OR_DEPARTMENT_OTHER): Payer: Medicare Other

## 2011-03-09 ENCOUNTER — Ambulatory Visit: Payer: Medicare Other | Admitting: Oncology

## 2011-03-09 VITALS — BP 128/75 | HR 81 | Temp 97.4°F | Ht 77.0 in | Wt 184.5 lb

## 2011-03-09 DIAGNOSIS — C189 Malignant neoplasm of colon, unspecified: Secondary | ICD-10-CM

## 2011-03-09 DIAGNOSIS — D6959 Other secondary thrombocytopenia: Secondary | ICD-10-CM

## 2011-03-09 DIAGNOSIS — G893 Neoplasm related pain (acute) (chronic): Secondary | ICD-10-CM

## 2011-03-09 DIAGNOSIS — T451X5A Adverse effect of antineoplastic and immunosuppressive drugs, initial encounter: Secondary | ICD-10-CM

## 2011-03-09 DIAGNOSIS — M549 Dorsalgia, unspecified: Secondary | ICD-10-CM

## 2011-03-09 DIAGNOSIS — K5900 Constipation, unspecified: Secondary | ICD-10-CM

## 2011-03-09 DIAGNOSIS — Z5111 Encounter for antineoplastic chemotherapy: Secondary | ICD-10-CM

## 2011-03-09 LAB — CBC WITH DIFFERENTIAL/PLATELET
Basophils Absolute: 0 10*3/uL (ref 0.0–0.1)
HCT: 39.5 % (ref 38.4–49.9)
HGB: 13 g/dL (ref 13.0–17.1)
LYMPH%: 18.6 % (ref 14.0–49.0)
MCH: 28 pg (ref 27.2–33.4)
MCHC: 32.9 g/dL (ref 32.0–36.0)
MONO#: 1.7 10*3/uL — ABNORMAL HIGH (ref 0.1–0.9)
NEUT%: 61.9 % (ref 39.0–75.0)
Platelets: 88 10*3/uL — ABNORMAL LOW (ref 140–400)
lymph#: 1.7 10*3/uL (ref 0.9–3.3)

## 2011-03-09 LAB — COMPREHENSIVE METABOLIC PANEL
BUN: 9 mg/dL (ref 6–23)
CO2: 27 mEq/L (ref 19–32)
Calcium: 9.5 mg/dL (ref 8.4–10.5)
Chloride: 101 mEq/L (ref 96–112)
Creatinine, Ser: 1.47 mg/dL — ABNORMAL HIGH (ref 0.50–1.35)
Total Bilirubin: 0.4 mg/dL (ref 0.3–1.2)

## 2011-03-09 MED ORDER — DEXAMETHASONE SODIUM PHOSPHATE 10 MG/ML IJ SOLN
10.0000 mg | Freq: Once | INTRAMUSCULAR | Status: AC
  Administered 2011-03-09: 10 mg via INTRAVENOUS

## 2011-03-09 MED ORDER — FLUOROURACIL CHEMO INJECTION 2.5 GM/50ML
400.0000 mg/m2 | Freq: Once | INTRAVENOUS | Status: AC
  Administered 2011-03-09: 800 mg via INTRAVENOUS
  Filled 2011-03-09: qty 16

## 2011-03-09 MED ORDER — DEXTROSE 5 % IV SOLN
Freq: Once | INTRAVENOUS | Status: AC
  Administered 2011-03-09: 13:00:00 via INTRAVENOUS

## 2011-03-09 MED ORDER — LEUCOVORIN CALCIUM INJECTION 350 MG
400.0000 mg/m2 | Freq: Once | INTRAVENOUS | Status: AC
  Administered 2011-03-09: 800 mg via INTRAVENOUS
  Filled 2011-03-09: qty 40

## 2011-03-09 MED ORDER — ONDANSETRON 8 MG/50ML IVPB (CHCC)
8.0000 mg | Freq: Once | INTRAVENOUS | Status: AC
  Administered 2011-03-09: 8 mg via INTRAVENOUS

## 2011-03-09 MED ORDER — OXALIPLATIN CHEMO INJECTION 100 MG/20ML
68.0000 mg/m2 | Freq: Once | INTRAVENOUS | Status: AC
  Administered 2011-03-09: 135 mg via INTRAVENOUS
  Filled 2011-03-09: qty 27

## 2011-03-09 MED ORDER — HYDROCODONE-ACETAMINOPHEN 5-325 MG PO TABS: 1.0000 | ORAL_TABLET | Freq: Four times a day (QID) | ORAL | Status: DC | PRN

## 2011-03-09 MED ORDER — SODIUM CHLORIDE 0.9 % IJ SOLN
10.0000 mL | INTRAMUSCULAR | Status: DC | PRN
  Administered 2011-03-09: 10 mL
  Filled 2011-03-09: qty 10

## 2011-03-09 MED ORDER — SODIUM CHLORIDE 0.9 % IV SOLN
2400.0000 mg/m2 | INTRAVENOUS | Status: DC
  Administered 2011-03-09: 4800 mg via INTRAVENOUS
  Filled 2011-03-09: qty 96

## 2011-03-09 NOTE — Telephone Encounter (Signed)
appts made and printed for 2/11 and 2/25   aom

## 2011-03-09 NOTE — Patient Instructions (Signed)
Eat well and drink lots of fluids.  Call prn.  Reports he will return 03-11-11 at 1:45 pm for pump d/c.

## 2011-03-09 NOTE — Progress Notes (Signed)
OFFICE PROGRESS NOTE   INTERVAL HISTORY:   She returns as scheduled. He completed another cycle of chemotherapy on 02/23/2011. He reports tolerating chemotherapy well. He denies nausea, mouth sores, and diarrhea. He has a good appetite. Cold sensitivity following chemotherapy last for a few days. He denies neuropathy symptoms today.  Objective:  Vital signs in last 24 hours:  Blood pressure 128/75, pulse 81, temperature 97.4 F (36.3 C), temperature source Oral, height 6\' 5"  (1.956 m), weight 184 lb 8 oz (83.689 kg).    HEENT: No thrush or ulcers Resp: Lungs clear bilaterally Cardio: Regular rate and rhythm GI: Mild tenderness in the right mid abdomen. No hepatomegaly. Left lower quadrant colostomy. Vascular: No leg edema Neuro: Very mild decrease in vibratory sense at several of the fingertips bilaterally. Others were completely normal.    Portacath/PICC-without erythema  Lab Results:  Lab Results  Component Value Date   WBC 9.0 03/09/2011   HGB 13.0 03/09/2011   HCT 39.5 03/09/2011   MCV 84.9 03/09/2011   PLT 88* 03/09/2011   ANC 5.6    Medications: I have reviewed the patient's current medications.  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 Status post cycle #1 of adjuvant FOLFOX chemotherapy 12/01/2010. He completed cycle 2 beginning 12/15/2010. He completed cycle 3 beginning 01/05/2011 with Neulasta support. He completed cycle 4 01/19/2011. He completed cycle 5 02/09/2011 and cycle 6 02/23/2011. #2-history of a Bowel obstruction secondary to the primary colon mass.  #3- Abdominal pain/constipation secondary to the descending/sigmoid colon mass.  #4-History of weight loss-improved.  #5-Status post Port-A-Cath placement 11/27/2010.  #6-History of neutropenia secondary to chemotherapy. Cycle 3 FOLFOX was held 12/29/2010 due to  neutropenia. He received Neulasta with cycle #3 FOLFOX chemotherapy on November 26. He did not receive Neulasta following cycle 4. Cycle 5 was held for one week due to neutropenia. He received Neulasta support following cycle #5 and cycle #6.  #7-Thrombocytopenia secondary to chemotherapy. He will contact the office with bruising/bleeding. He will hold aspirin therapy. We dose reduced the oxaliplatin with cycle #7 #8-Back pain following cycle 3 FOLFOX-likely related to Neulasta.     Disposition:  He has completed 6 cycles of adjuvant therapy. He has tolerated chemotherapy well. He has moderate thrombocytopenia today. He would like to continue chemotherapy on schedule. We dose reduce the oxaliplatin. He will discontinue aspirin and he knows to contact us for bleeding or bruising. He will return for an office visit and chemotherapy in 2 weeks.   Lucile Shutters, MD  03/09/2011  2:26 PM

## 2011-03-10 ENCOUNTER — Other Ambulatory Visit: Payer: Self-pay | Admitting: Certified Registered Nurse Anesthetist

## 2011-03-11 ENCOUNTER — Ambulatory Visit (HOSPITAL_BASED_OUTPATIENT_CLINIC_OR_DEPARTMENT_OTHER): Payer: Medicare Other

## 2011-03-11 VITALS — BP 124/75 | HR 74 | Temp 98.1°F

## 2011-03-11 DIAGNOSIS — C187 Malignant neoplasm of sigmoid colon: Secondary | ICD-10-CM

## 2011-03-11 DIAGNOSIS — C189 Malignant neoplasm of colon, unspecified: Secondary | ICD-10-CM

## 2011-03-11 MED ORDER — SODIUM CHLORIDE 0.9 % IJ SOLN
10.0000 mL | INTRAMUSCULAR | Status: DC | PRN
  Administered 2011-03-11: 10 mL
  Filled 2011-03-11: qty 10

## 2011-03-11 MED ORDER — HEPARIN SOD (PORK) LOCK FLUSH 100 UNIT/ML IV SOLN
500.0000 [IU] | Freq: Once | INTRAVENOUS | Status: AC
  Administered 2011-03-11: 500 [IU]
  Filled 2011-03-11: qty 5

## 2011-03-11 MED ORDER — PEGFILGRASTIM INJECTION 6 MG/0.6ML
6.0000 mg | Freq: Once | SUBCUTANEOUS | Status: AC
  Administered 2011-03-11: 6 mg via SUBCUTANEOUS
  Filled 2011-03-11: qty 0.6

## 2011-03-22 ENCOUNTER — Other Ambulatory Visit: Payer: Self-pay | Admitting: Oncology

## 2011-03-23 ENCOUNTER — Ambulatory Visit: Payer: Medicare Other | Admitting: Oncology

## 2011-03-23 ENCOUNTER — Ambulatory Visit (HOSPITAL_BASED_OUTPATIENT_CLINIC_OR_DEPARTMENT_OTHER): Payer: Medicare Other

## 2011-03-23 ENCOUNTER — Other Ambulatory Visit: Payer: Self-pay | Admitting: *Deleted

## 2011-03-23 ENCOUNTER — Other Ambulatory Visit: Payer: Medicare Other | Admitting: Lab

## 2011-03-23 VITALS — BP 125/78 | HR 90 | Temp 97.4°F | Ht 77.0 in | Wt 184.7 lb

## 2011-03-23 DIAGNOSIS — C189 Malignant neoplasm of colon, unspecified: Secondary | ICD-10-CM

## 2011-03-23 DIAGNOSIS — Z5111 Encounter for antineoplastic chemotherapy: Secondary | ICD-10-CM

## 2011-03-23 LAB — COMPREHENSIVE METABOLIC PANEL
Albumin: 3.9 g/dL (ref 3.5–5.2)
BUN: 9 mg/dL (ref 6–23)
CO2: 29 mEq/L (ref 19–32)
Calcium: 9.3 mg/dL (ref 8.4–10.5)
Chloride: 100 mEq/L (ref 96–112)
Glucose, Bld: 91 mg/dL (ref 70–99)
Potassium: 4.2 mEq/L (ref 3.5–5.3)
Sodium: 137 mEq/L (ref 135–145)
Total Protein: 7.2 g/dL (ref 6.0–8.3)

## 2011-03-23 LAB — CBC WITH DIFFERENTIAL/PLATELET
Basophils Absolute: 0 10*3/uL (ref 0.0–0.1)
Eosinophils Absolute: 0 10*3/uL (ref 0.0–0.5)
HCT: 39.1 % (ref 38.4–49.9)
HGB: 13.2 g/dL (ref 13.0–17.1)
MCH: 28.7 pg (ref 27.2–33.4)
NEUT#: 3 10*3/uL (ref 1.5–6.5)
NEUT%: 45.7 % (ref 39.0–75.0)
RDW: 18 % — ABNORMAL HIGH (ref 11.0–14.6)
lymph#: 1.8 10*3/uL (ref 0.9–3.3)

## 2011-03-23 MED ORDER — ONDANSETRON HCL 8 MG PO TABS: 8.0000 mg | ORAL_TABLET | Freq: Two times a day (BID) | ORAL | Status: DC | PRN

## 2011-03-23 MED ORDER — DEXAMETHASONE SODIUM PHOSPHATE 10 MG/ML IJ SOLN
10.0000 mg | Freq: Once | INTRAMUSCULAR | Status: AC
  Administered 2011-03-23: 10 mg via INTRAVENOUS

## 2011-03-23 MED ORDER — DEXTROSE 5 % IV SOLN
Freq: Once | INTRAVENOUS | Status: AC
  Administered 2011-03-23: 12:00:00 via INTRAVENOUS

## 2011-03-23 MED ORDER — SODIUM CHLORIDE 0.9 % IJ SOLN
10.0000 mL | INTRAMUSCULAR | Status: DC | PRN
  Filled 2011-03-23: qty 10

## 2011-03-23 MED ORDER — SODIUM CHLORIDE 0.9 % IV SOLN
2400.0000 mg/m2 | INTRAVENOUS | Status: DC
  Administered 2011-03-23: 4800 mg via INTRAVENOUS
  Filled 2011-03-23: qty 96

## 2011-03-23 MED ORDER — LEUCOVORIN CALCIUM INJECTION 350 MG
400.0000 mg/m2 | Freq: Once | INTRAVENOUS | Status: AC
  Administered 2011-03-23: 800 mg via INTRAVENOUS
  Filled 2011-03-23: qty 40

## 2011-03-23 MED ORDER — OXALIPLATIN CHEMO INJECTION 100 MG/20ML
68.0000 mg/m2 | Freq: Once | INTRAVENOUS | Status: AC
  Administered 2011-03-23: 135 mg via INTRAVENOUS
  Filled 2011-03-23: qty 27

## 2011-03-23 MED ORDER — HEPARIN SOD (PORK) LOCK FLUSH 100 UNIT/ML IV SOLN
500.0000 [IU] | Freq: Once | INTRAVENOUS | Status: DC
  Filled 2011-03-23: qty 5

## 2011-03-23 MED ORDER — DEXAMETHASONE 4 MG PO TABS: 8.0000 mg | ORAL_TABLET | Freq: Two times a day (BID) | ORAL | Status: AC

## 2011-03-23 MED ORDER — PALONOSETRON HCL INJECTION 0.25 MG/5ML
0.2500 mg | Freq: Once | INTRAVENOUS | Status: AC
Start: 2011-03-23 — End: 2011-03-23
  Administered 2011-03-23: 0.25 mg via INTRAVENOUS

## 2011-03-23 MED ORDER — FLUOROURACIL CHEMO INJECTION 2.5 GM/50ML
400.0000 mg/m2 | Freq: Once | INTRAVENOUS | Status: AC
  Administered 2011-03-23: 800 mg via INTRAVENOUS
  Filled 2011-03-23: qty 16

## 2011-03-23 NOTE — Telephone Encounter (Signed)
appts made and tx added,printed  for 3/11 and 3/13   aom

## 2011-03-23 NOTE — Patient Instructions (Signed)
Patient aware of next appointment; pump d/c appointment changed to 03/25/11 @ 1330; meds for nausea at home as needed.

## 2011-03-23 NOTE — Patient Instructions (Signed)
[facility] Discharge Instructions for Patients Receiving Chemotherapy  Today you received the following chemotherapy agents : Oxaliplatin, 5 FU, and Leucovorin. You also received a new anti-nausea medication, Aloxi  To help prevent nausea and vomiting after your treatment, we encourage you to take your nausea medication : Compazine 10 mg every 6 hours IF NEEDED (will make you sleepy). Also take Decadron 8 mg twice daily x 2 days & begin taking it at 8 am on the day of your pump D/C.  You may take your Zofran for delayed nausea twice daily as needed-do not take until 3rd day after your chemo (will not cause you to be drowsy)   If you develop nausea and vomiting that is not controlled by your nausea medication, call the clinic. If it is after clinic hours your family physician or the after hours number for the clinic or go to the Emergency Department.   BELOW ARE SYMPTOMS THAT SHOULD BE REPORTED IMMEDIATELY:  *FEVER GREATER THAN 100.5 F  *CHILLS WITH OR WITHOUT FEVER  NAUSEA AND VOMITING THAT IS NOT CONTROLLED WITH YOUR NAUSEA MEDICATION  *UNUSUAL SHORTNESS OF BREATH  *UNUSUAL BRUISING OR BLEEDING  TENDERNESS IN MOUTH AND THROAT WITH OR WITHOUT PRESENCE OF ULCERS  *URINARY PROBLEMS  *BOWEL PROBLEMS  UNUSUAL RASH Items with * indicate a potential emergency and should be followed up as soon as possible.   Feel free to call the clinic you have any questions or concerns. The clinic phone number is [phone].   I have been informed and understand all the instructions given to me. I know to contact the clinic, my physician, or go to the Emergency Department if any problems should occur. I do not have any questions at this time, but understand that I may call the clinic during office hours   should I have any questions or need assistance in obtaining follow up care.    __________________________________________  _____________  __________ Signature of Patient or  Authorized Representative            Date                   Time    __________________________________________ Nurse's Signature

## 2011-03-23 NOTE — Progress Notes (Signed)
OFFICE PROGRESS NOTE   INTERVAL HISTORY:   He completed another cycle of FOLFOX on 03/09/2011. He developed nausea beginning on day 3. The nausea has persisted. He develops somnolence with Compazine. He denies mouth sores, diarrhea, and emesis. He denies neuropathy symptoms. He complains of malaise.  Objective:  Vital signs in last 24 hours:  Blood pressure 125/78, pulse 90, temperature 97.4 F (36.3 C), temperature source Oral, height 6\' 5"  (1.956 m), weight 184 lb 11.2 oz (83.779 kg).    HEENT: No thrush or ulcers Resp: Lungs clear bilaterally Cardio: Regular rate and rhythm GI: No hepatomegaly, left lower quadrant colostomy, nontender Vascular: No leg edema Neuro: Mild decrease in vibratory sense at the fingertip bilaterally    Portacath/PICC-without erythema  Lab Results:  Lab Results  Component Value Date   WBC 6.6 03/23/2011   HGB 13.2 03/23/2011   HCT 39.1 03/23/2011   MCV 85.0 03/23/2011   PLT 96* 03/23/2011   ANC 3.0    Medications: I have reviewed the patient's current medications.  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 Status post cycle #1 of adjuvant FOLFOX chemotherapy 12/01/2010. He completed cycle 2 beginning 12/15/2010. He completed cycle 3 beginning 01/05/2011 with Neulasta support. He completed cycle 7 on 03/09/2011.  #2-history of a Bowel obstruction secondary to the primary colon mass.  #3- Abdominal pain/constipation secondary to the descending/sigmoid colon mass.  #4-History of weight loss-improved.  #5-Status post Port-A-Cath placement 11/27/2010.  #6-History of neutropenia secondary to chemotherapy. Cycle 3 FOLFOX was held 12/29/2010 due to neutropenia. He received Neulasta with cycle #3 FOLFOX chemotherapy on November 26. He did not receive Neulasta following cycle 4. Cycle 5 was held for one week due  to neutropenia. He received Neulasta support following cycle #5 and cycle #6 and cycle #7  #7-Thrombocytopenia secondary to chemotherapy. Stable. He will contact the office with bruising/bleeding. He will hold aspirin therapy. We dose reduced the oxaliplatin with cycle #7  #8-Back pain following cycle 3 FOLFOX-likely related to Neulasta.  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 He will take prophylactic Decadron on days 2 and 3 following chemotherapy. He will try Zofran as needed.    Disposition:  He has completed 7 cycles of adjuvant FOLFOX chemotherapy. He he developed nausea following most recent cycle of chemotherapy. We adjusted the anti-emetic regimen today. The plan is to proceed with cycle 8 FOLFOX today. He has stable mild thrombocytopenia. He will return for an office visit and chemotherapy in 2 weeks.   Lucile Shutters, MD  03/23/2011  5:20 PM

## 2011-03-25 ENCOUNTER — Ambulatory Visit (HOSPITAL_BASED_OUTPATIENT_CLINIC_OR_DEPARTMENT_OTHER): Payer: Medicare Other

## 2011-03-25 VITALS — BP 116/70 | HR 84 | Temp 98.7°F

## 2011-03-25 DIAGNOSIS — C189 Malignant neoplasm of colon, unspecified: Secondary | ICD-10-CM

## 2011-03-25 DIAGNOSIS — Z5189 Encounter for other specified aftercare: Secondary | ICD-10-CM

## 2011-03-25 MED ORDER — PEGFILGRASTIM INJECTION 6 MG/0.6ML
6.0000 mg | Freq: Once | SUBCUTANEOUS | Status: AC
  Administered 2011-03-25: 6 mg via SUBCUTANEOUS
  Filled 2011-03-25: qty 0.6

## 2011-03-25 MED ORDER — HEPARIN SOD (PORK) LOCK FLUSH 100 UNIT/ML IV SOLN
500.0000 [IU] | Freq: Once | INTRAVENOUS | Status: AC
  Administered 2011-03-25: 500 [IU]
  Filled 2011-03-25: qty 5

## 2011-03-25 MED ORDER — SODIUM CHLORIDE 0.9 % IJ SOLN
10.0000 mL | INTRAMUSCULAR | Status: DC | PRN
  Administered 2011-03-25: 10 mL
  Filled 2011-03-25: qty 10

## 2011-04-04 ENCOUNTER — Other Ambulatory Visit: Payer: Self-pay | Admitting: Oncology

## 2011-04-06 ENCOUNTER — Ambulatory Visit (HOSPITAL_BASED_OUTPATIENT_CLINIC_OR_DEPARTMENT_OTHER): Payer: Medicare Other | Admitting: Nurse Practitioner

## 2011-04-06 ENCOUNTER — Telehealth: Payer: Self-pay | Admitting: Oncology

## 2011-04-06 ENCOUNTER — Ambulatory Visit (HOSPITAL_BASED_OUTPATIENT_CLINIC_OR_DEPARTMENT_OTHER): Payer: Medicare Other

## 2011-04-06 ENCOUNTER — Other Ambulatory Visit (HOSPITAL_BASED_OUTPATIENT_CLINIC_OR_DEPARTMENT_OTHER): Payer: Medicare Other | Admitting: Lab

## 2011-04-06 VITALS — BP 118/76 | HR 96 | Temp 97.3°F | Ht 77.0 in | Wt 185.5 lb

## 2011-04-06 DIAGNOSIS — C189 Malignant neoplasm of colon, unspecified: Secondary | ICD-10-CM

## 2011-04-06 DIAGNOSIS — D696 Thrombocytopenia, unspecified: Secondary | ICD-10-CM

## 2011-04-06 DIAGNOSIS — Z5111 Encounter for antineoplastic chemotherapy: Secondary | ICD-10-CM

## 2011-04-06 LAB — COMPREHENSIVE METABOLIC PANEL
CO2: 27 mEq/L (ref 19–32)
Calcium: 9.1 mg/dL (ref 8.4–10.5)
Glucose, Bld: 102 mg/dL — ABNORMAL HIGH (ref 70–99)
Sodium: 140 mEq/L (ref 135–145)
Total Bilirubin: 0.5 mg/dL (ref 0.3–1.2)
Total Protein: 6.9 g/dL (ref 6.0–8.3)

## 2011-04-06 LAB — CBC WITH DIFFERENTIAL/PLATELET
Basophils Absolute: 0 10*3/uL (ref 0.0–0.1)
Eosinophils Absolute: 0 10*3/uL (ref 0.0–0.5)
HGB: 12.8 g/dL — ABNORMAL LOW (ref 13.0–17.1)
LYMPH%: 18.6 % (ref 14.0–49.0)
MCV: 86.2 fL (ref 79.3–98.0)
MONO%: 16.3 % — ABNORMAL HIGH (ref 0.0–14.0)
NEUT#: 5.7 10*3/uL (ref 1.5–6.5)
Platelets: 81 10*3/uL — ABNORMAL LOW (ref 140–400)
RDW: 18.8 % — ABNORMAL HIGH (ref 11.0–14.6)

## 2011-04-06 MED ORDER — OXALIPLATIN CHEMO INJECTION 100 MG/20ML
68.0000 mg/m2 | Freq: Once | INTRAVENOUS | Status: AC
  Administered 2011-04-06: 135 mg via INTRAVENOUS
  Filled 2011-04-06: qty 27

## 2011-04-06 MED ORDER — SODIUM CHLORIDE 0.9 % IV SOLN
2400.0000 mg/m2 | INTRAVENOUS | Status: DC
  Administered 2011-04-06: 4800 mg via INTRAVENOUS
  Filled 2011-04-06: qty 96

## 2011-04-06 MED ORDER — DEXTROSE 5 % IV SOLN
400.0000 mg/m2 | Freq: Once | INTRAVENOUS | Status: AC
  Administered 2011-04-06: 800 mg via INTRAVENOUS
  Filled 2011-04-06: qty 40

## 2011-04-06 MED ORDER — DEXTROSE 5 % IV SOLN
Freq: Once | INTRAVENOUS | Status: AC
  Administered 2011-04-06: 10:00:00 via INTRAVENOUS

## 2011-04-06 MED ORDER — HEPARIN SOD (PORK) LOCK FLUSH 100 UNIT/ML IV SOLN
500.0000 [IU] | Freq: Once | INTRAVENOUS | Status: DC
  Filled 2011-04-06: qty 5

## 2011-04-06 MED ORDER — FLUOROURACIL CHEMO INJECTION 2.5 GM/50ML
400.0000 mg/m2 | Freq: Once | INTRAVENOUS | Status: AC
  Administered 2011-04-06: 800 mg via INTRAVENOUS
  Filled 2011-04-06: qty 16

## 2011-04-06 MED ORDER — PALONOSETRON HCL INJECTION 0.25 MG/5ML
0.2500 mg | Freq: Once | INTRAVENOUS | Status: AC
  Administered 2011-04-06: 0.25 mg via INTRAVENOUS

## 2011-04-06 MED ORDER — DEXAMETHASONE SODIUM PHOSPHATE 10 MG/ML IJ SOLN
10.0000 mg | Freq: Once | INTRAMUSCULAR | Status: AC
  Administered 2011-04-06: 10 mg via INTRAVENOUS

## 2011-04-06 MED ORDER — SODIUM CHLORIDE 0.9 % IJ SOLN
10.0000 mL | INTRAMUSCULAR | Status: DC | PRN
Start: 2011-04-06 — End: 2011-04-06
  Filled 2011-04-06: qty 10

## 2011-04-06 NOTE — Telephone Encounter (Signed)
appts made and printed for pt for lab,lab md and tx

## 2011-04-06 NOTE — Progress Notes (Signed)
OFFICE PROGRESS NOTE  Interval history:  Mr. Enke returns as scheduled. He completed cycle 8 FOLFOX on 03/23/2011. He feels well. He denies nausea/vomiting. No mouth sores. He had loose stools for "a few days" following the most recent chemotherapy. Cold sensitivity lasted 1-1/2-2 days. He denies persistent neuropathy symptoms. Over the past weekend he noted blood from the ostomy. He estimates the amount at approximately 1 tablespoon.   Objective: Blood pressure 118/76, pulse 96, temperature 97.3 F (36.3 C), temperature source Oral, height 6\' 5"  (1.956 m), weight 185 lb 8 oz (84.142 kg).  Oropharynx is without thrush or ulceration. Lungs are clear. Regular cardiac rhythm. Port-A-Cath site is without erythema. Abdomen is soft and nontender. No hepatomegaly. Left lower quadrant ostomy. Brown stool in the collection bag. Extremities without edema. Vibratory sense is mildly to moderately decreased over the fingertips bilaterally.  Lab Results: Lab Results  Component Value Date   WBC 8.7 04/06/2011   HGB 12.8* 04/06/2011   HCT 38.8 04/06/2011   MCV 86.2 04/06/2011   PLT 81* 04/06/2011    Chemistry:    Chemistry      Component Value Date/Time   NA 137 03/23/2011 0947   K 4.2 03/23/2011 0947   CL 100 03/23/2011 0947   CO2 29 03/23/2011 0947   BUN 9 03/23/2011 0947   CREATININE 1.47* 03/23/2011 0947      Component Value Date/Time   CALCIUM 9.3 03/23/2011 0947   ALKPHOS 96 03/23/2011 0947   AST 32 03/23/2011 0947   ALT 20 03/23/2011 0947   BILITOT 0.5 03/23/2011 0947       Studies/Results: No results found.  Medications: I have reviewed the patient's current medications.  Assessment/Plan:  1. 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 Status post cycle #1 adjuvant FOLFOX chemotherapy 12/01/2010. He completed cycle 2 beginning 12/15/2010. He completed cycle 3  beginning 01/05/2011 with Neulasta support. He completed cycle 8 on 03/23/2011. 2. History of a bowel obstruction secondary to the primary colon mass. 3. Abdominal pain/constipation secondary to the descending/sigmoid colon mass. 4. History of weight loss. Improved. 5. Status post Port-A-Cath placement 11/27/2010. 6. History of neutropenia secondary to chemotherapy. Cycle 3 FOLFOX was held 12/29/2010 due to neutropenia. He received Neulasta with cycle #3 FOLFOX chemotherapy on 01/05/2011. He did not receive Neulasta following cycle 4. Cycle 5 was held for one week due to neutropenia. He received Neulasta with subsequent treatments. 7. Thrombocytopenia secondary to chemotherapy. Oxaliplatin was dose reduced beginning with cycle 7. 8. Back pain following cycle 3 FOLFOX. Likely related to Neulasta. 9. 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 10. Delayed nausea following cycle 7 of FOLFOX. Aloxi was added to the antiemetic regimen beginning with cycle 8 FOLFOX. He also began prophylactic Decadron on days 2 and 3 following chemotherapy with cycle 8. He did not have nausea following cycle 8 FOLFOX.  Disposition-Mr. Luffman appears stable. Plan to proceed with cycle 9 FOLFOX today as scheduled. He has persistent thrombocytopenia. He will return for a CBC 04/16/2011. He will contact the office with spontaneous bruising/bleeding. He will return for a followup visit and cycle 10 of FOLFOX 04/20/2011.  Plan reviewed with Dr. Truett Perna.    Lonna Cobb ANP/GNP-BC

## 2011-04-06 NOTE — Patient Instructions (Signed)
Pt ambulates out of dept with spouse.  Aware of appt for pump d/c. dph

## 2011-04-07 ENCOUNTER — Telehealth: Payer: Self-pay | Admitting: Oncology

## 2011-04-07 NOTE — Telephone Encounter (Signed)
All chemos added,pt was aware to p/u copy of sch on 2/27 appt    aom

## 2011-04-08 ENCOUNTER — Ambulatory Visit (HOSPITAL_BASED_OUTPATIENT_CLINIC_OR_DEPARTMENT_OTHER): Payer: Medicare Other

## 2011-04-08 VITALS — BP 133/75 | HR 84 | Temp 98.4°F

## 2011-04-08 DIAGNOSIS — Z5112 Encounter for antineoplastic immunotherapy: Secondary | ICD-10-CM

## 2011-04-08 DIAGNOSIS — C189 Malignant neoplasm of colon, unspecified: Secondary | ICD-10-CM

## 2011-04-08 MED ORDER — PEGFILGRASTIM INJECTION 6 MG/0.6ML
6.0000 mg | Freq: Once | SUBCUTANEOUS | Status: AC
  Administered 2011-04-08: 6 mg via SUBCUTANEOUS
  Filled 2011-04-08: qty 0.6

## 2011-04-08 MED ORDER — HEPARIN SOD (PORK) LOCK FLUSH 100 UNIT/ML IV SOLN
500.0000 [IU] | Freq: Once | INTRAVENOUS | Status: AC
  Administered 2011-04-08: 500 [IU]
  Filled 2011-04-08: qty 5

## 2011-04-08 MED ORDER — SODIUM CHLORIDE 0.9 % IJ SOLN
10.0000 mL | INTRAMUSCULAR | Status: DC | PRN
  Administered 2011-04-08: 10 mL
  Filled 2011-04-08: qty 10

## 2011-04-16 ENCOUNTER — Other Ambulatory Visit (HOSPITAL_BASED_OUTPATIENT_CLINIC_OR_DEPARTMENT_OTHER): Payer: Medicare Other | Admitting: Lab

## 2011-04-16 DIAGNOSIS — C189 Malignant neoplasm of colon, unspecified: Secondary | ICD-10-CM

## 2011-04-16 LAB — CBC WITH DIFFERENTIAL/PLATELET
BASO%: 0.5 % (ref 0.0–2.0)
EOS%: 0.4 % (ref 0.0–7.0)
MCH: 29.5 pg (ref 27.2–33.4)
MCV: 86.8 fL (ref 79.3–98.0)
MONO%: 17.3 % — ABNORMAL HIGH (ref 0.0–14.0)
RBC: 4.2 10*6/uL (ref 4.20–5.82)
RDW: 20.2 % — ABNORMAL HIGH (ref 11.0–14.6)

## 2011-04-19 ENCOUNTER — Other Ambulatory Visit: Payer: Self-pay | Admitting: Oncology

## 2011-04-20 ENCOUNTER — Ambulatory Visit (HOSPITAL_BASED_OUTPATIENT_CLINIC_OR_DEPARTMENT_OTHER): Payer: Medicare Other

## 2011-04-20 ENCOUNTER — Ambulatory Visit: Payer: Medicare Other | Admitting: Oncology

## 2011-04-20 ENCOUNTER — Other Ambulatory Visit (HOSPITAL_BASED_OUTPATIENT_CLINIC_OR_DEPARTMENT_OTHER): Payer: Medicare Other | Admitting: Lab

## 2011-04-20 VITALS — BP 131/82 | HR 104 | Temp 97.5°F | Ht 77.0 in | Wt 187.6 lb

## 2011-04-20 DIAGNOSIS — D6959 Other secondary thrombocytopenia: Secondary | ICD-10-CM

## 2011-04-20 DIAGNOSIS — Z5111 Encounter for antineoplastic chemotherapy: Secondary | ICD-10-CM

## 2011-04-20 DIAGNOSIS — T451X5A Adverse effect of antineoplastic and immunosuppressive drugs, initial encounter: Secondary | ICD-10-CM

## 2011-04-20 DIAGNOSIS — C189 Malignant neoplasm of colon, unspecified: Secondary | ICD-10-CM

## 2011-04-20 DIAGNOSIS — G620 Drug-induced polyneuropathy: Secondary | ICD-10-CM

## 2011-04-20 LAB — CBC WITH DIFFERENTIAL/PLATELET
Eosinophils Absolute: 0 10*3/uL (ref 0.0–0.5)
MONO#: 1.2 10*3/uL — ABNORMAL HIGH (ref 0.1–0.9)
MONO%: 17.6 % — ABNORMAL HIGH (ref 0.0–14.0)
NEUT#: 4.4 10*3/uL (ref 1.5–6.5)
RBC: 4.34 10*6/uL (ref 4.20–5.82)
RDW: 18.8 % — ABNORMAL HIGH (ref 11.0–14.6)
WBC: 6.9 10*3/uL (ref 4.0–10.3)
lymph#: 1.3 10*3/uL (ref 0.9–3.3)
nRBC: 0 % (ref 0–0)

## 2011-04-20 LAB — COMPREHENSIVE METABOLIC PANEL
ALT: 21 U/L (ref 0–53)
CO2: 28 mEq/L (ref 19–32)
Chloride: 104 mEq/L (ref 96–112)
Sodium: 139 mEq/L (ref 135–145)
Total Bilirubin: 0.5 mg/dL (ref 0.3–1.2)
Total Protein: 6.7 g/dL (ref 6.0–8.3)

## 2011-04-20 MED ORDER — SODIUM CHLORIDE 0.9 % IJ SOLN
10.0000 mL | INTRAMUSCULAR | Status: DC | PRN
  Filled 2011-04-20: qty 10

## 2011-04-20 MED ORDER — FLUOROURACIL CHEMO INJECTION 5 GM/100ML
2400.0000 mg/m2 | INTRAVENOUS | Status: DC
  Administered 2011-04-20: 4800 mg via INTRAVENOUS
  Filled 2011-04-20: qty 96

## 2011-04-20 MED ORDER — HEPARIN SOD (PORK) LOCK FLUSH 100 UNIT/ML IV SOLN
500.0000 [IU] | Freq: Once | INTRAVENOUS | Status: DC
  Filled 2011-04-20: qty 5

## 2011-04-20 MED ORDER — FLUOROURACIL CHEMO INJECTION 2.5 GM/50ML
400.0000 mg/m2 | Freq: Once | INTRAVENOUS | Status: AC
  Administered 2011-04-20: 800 mg via INTRAVENOUS
  Filled 2011-04-20: qty 16

## 2011-04-20 MED ORDER — LEUCOVORIN CALCIUM INJECTION 350 MG
400.0000 mg/m2 | Freq: Once | INTRAVENOUS | Status: AC
  Administered 2011-04-20: 800 mg via INTRAVENOUS
  Filled 2011-04-20: qty 40

## 2011-04-20 NOTE — Progress Notes (Signed)
OFFICE PROGRESS NOTE   INTERVAL HISTORY:   He returns as scheduled. He completed another cycle of FOLFOX on 04/06/2011. He reports mild nausea following chemotherapy. There were a few days of mild diarrhea. The cold sensitivity has improved. He has mild numbness at the fingers that does not interfere with activity. There is irritation at the skin surrounding the ostomy site.  Objective:  Vital signs in last 24 hours:  Blood pressure 131/82, pulse 104, temperature 97.5 F (36.4 C), temperature source Oral, height 6\' 5"  (1.956 m), weight 187 lb 9.6 oz (85.095 kg).    HEENT: No thrush or ulcers Resp: Lungs clear bilaterally Cardio: Regular rate and rhythm GI: Nontender, no hepatomegaly Vascular: No leg edema Neuro: The vibratory sense is mildly to moderately diminished at the fingertip bilaterally    Portacath/PICC-without erythema  Lab Results:  Lab Results  Component Value Date   WBC 6.9 04/20/2011   HGB 12.3* 04/20/2011   HCT 37.8* 04/20/2011   MCV 87.1 04/20/2011   PLT 79* 04/20/2011   ANC 4.4    Medications: I have reviewed the patient's current medications.  Assessment/Plan: 1. 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 Status post cycle #1 adjuvant FOLFOX chemotherapy 12/01/2010. He completed cycle 2 beginning 12/15/2010. He completed cycle 3 beginning 01/05/2011 with Neulasta support. He completed cycle  on 04/06/2011. 2. History of a bowel obstruction secondary to the primary colon mass. 3. History of Abdominal pain/constipation secondary to the descending/sigmoid colon mass. 4. History of weight loss. Improved. 5. Status post Port-A-Cath placement 11/27/2010. 6. History of neutropenia secondary to chemotherapy. Cycle 3 FOLFOX was held 12/29/2010 due to neutropenia. He received Neulasta with cycle #3 FOLFOX chemotherapy on 01/05/2011. He did not  receive Neulasta following cycle 4. Cycle 5 was held for one week due to neutropenia. He received Neulasta with subsequent treatments. 7. Thrombocytopenia secondary to chemotherapy. Oxaliplatin was dose reduced beginning with cycle 7. 8. Back pain following cycle 3 FOLFOX. Likely related to Neulasta. 9. 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 10. Delayed nausea following cycle 7 of FOLFOX. Aloxi was added to the antiemetic regimen beginning with cycle 8 FOLFOX. He also began prophylactic Decadron on days 2 and 3 following chemotherapy with cycle 8. He did not have nausea following cycle 8 or cycle 9 FOLFOX.  Disposition:  He has persistent thrombocytopenia and a mild to moderate loss of vibratory sense at the fingertips. We decided to hold oxaliplatin with cycle 10 FOLFOX today. We will also discontinue the anti-medical regimen, Neulasta, and home Decadron with this cycle. He will return for an office visit and cycle 11 of FOLFOX in 2 weeks. We will resume oxaliplatin depending on his clinical status and the platelet count when he returns in 2 weeks.   Lucile Shutters, MD  04/20/2011  1:38 PM

## 2011-04-22 ENCOUNTER — Ambulatory Visit (HOSPITAL_BASED_OUTPATIENT_CLINIC_OR_DEPARTMENT_OTHER): Payer: Medicare Other

## 2011-04-22 VITALS — BP 118/77 | HR 116 | Temp 98.8°F

## 2011-04-22 DIAGNOSIS — C189 Malignant neoplasm of colon, unspecified: Secondary | ICD-10-CM

## 2011-04-22 MED ORDER — HEPARIN SOD (PORK) LOCK FLUSH 100 UNIT/ML IV SOLN
500.0000 [IU] | Freq: Once | INTRAVENOUS | Status: AC
  Administered 2011-04-22: 500 [IU]
  Filled 2011-04-22: qty 5

## 2011-04-22 MED ORDER — SODIUM CHLORIDE 0.9 % IJ SOLN
10.0000 mL | INTRAMUSCULAR | Status: DC | PRN
  Administered 2011-04-22: 10 mL
  Filled 2011-04-22: qty 10

## 2011-05-03 ENCOUNTER — Other Ambulatory Visit: Payer: Self-pay | Admitting: Oncology

## 2011-05-04 ENCOUNTER — Ambulatory Visit: Payer: Medicare Other | Admitting: Nurse Practitioner

## 2011-05-04 ENCOUNTER — Telehealth: Payer: Self-pay | Admitting: Oncology

## 2011-05-04 ENCOUNTER — Other Ambulatory Visit: Payer: Medicare Other | Admitting: Lab

## 2011-05-04 ENCOUNTER — Ambulatory Visit (HOSPITAL_BASED_OUTPATIENT_CLINIC_OR_DEPARTMENT_OTHER): Payer: Medicare Other

## 2011-05-04 VITALS — BP 116/74 | HR 92 | Temp 98.1°F | Ht 77.0 in | Wt 187.6 lb

## 2011-05-04 DIAGNOSIS — C189 Malignant neoplasm of colon, unspecified: Secondary | ICD-10-CM

## 2011-05-04 DIAGNOSIS — Z5111 Encounter for antineoplastic chemotherapy: Secondary | ICD-10-CM

## 2011-05-04 LAB — CBC WITH DIFFERENTIAL/PLATELET
Basophils Absolute: 0 10*3/uL (ref 0.0–0.1)
EOS%: 0.6 % (ref 0.0–7.0)
Eosinophils Absolute: 0 10*3/uL (ref 0.0–0.5)
HGB: 12.3 g/dL — ABNORMAL LOW (ref 13.0–17.1)
MCH: 28.8 pg (ref 27.2–33.4)
MCV: 87.1 fL (ref 79.3–98.0)
MONO%: 17.6 % — ABNORMAL HIGH (ref 0.0–14.0)
NEUT#: 2.3 10*3/uL (ref 1.5–6.5)
RBC: 4.27 10*6/uL (ref 4.20–5.82)
RDW: 18.6 % — ABNORMAL HIGH (ref 11.0–14.6)
lymph#: 1.6 10*3/uL (ref 0.9–3.3)

## 2011-05-04 LAB — COMPREHENSIVE METABOLIC PANEL
ALT: 16 U/L (ref 0–53)
AST: 32 U/L (ref 0–37)
Albumin: 3.8 g/dL (ref 3.5–5.2)
Alkaline Phosphatase: 75 U/L (ref 39–117)
Calcium: 9.1 mg/dL (ref 8.4–10.5)
Chloride: 101 mEq/L (ref 96–112)
Potassium: 4.2 mEq/L (ref 3.5–5.3)
Sodium: 138 mEq/L (ref 135–145)
Total Protein: 6.8 g/dL (ref 6.0–8.3)

## 2011-05-04 MED ORDER — SODIUM CHLORIDE 0.9 % IV SOLN
2400.0000 mg/m2 | INTRAVENOUS | Status: DC
  Administered 2011-05-04: 4800 mg via INTRAVENOUS
  Filled 2011-05-04: qty 96

## 2011-05-04 MED ORDER — FLUOROURACIL CHEMO INJECTION 2.5 GM/50ML
400.0000 mg/m2 | Freq: Once | INTRAVENOUS | Status: AC
  Administered 2011-05-04: 800 mg via INTRAVENOUS
  Filled 2011-05-04: qty 16

## 2011-05-04 MED ORDER — DEXTROSE 5 % IV SOLN: Freq: Once | INTRAVENOUS | Status: DC

## 2011-05-04 MED ORDER — LEUCOVORIN CALCIUM INJECTION 350 MG
400.0000 mg/m2 | Freq: Once | INTRAVENOUS | Status: AC
  Administered 2011-05-04: 800 mg via INTRAVENOUS
  Filled 2011-05-04: qty 40

## 2011-05-04 NOTE — Progress Notes (Signed)
OFFICE PROGRESS NOTE  Interval history:  Patrick Woodward returns as scheduled. He completed cycle 10 FOLFOX 04/20/2011. Oxaliplatin was held due to thrombocytopenia and decreased vibratory sense at the fingertips. He feels well. No nausea or vomiting. No mouth sores. He has intermittent loose stools. He has a good appetite. He reports stable numbness in the fingertips. The numbness does not interfere with activity.   Objective: Blood pressure 116/74, pulse 92, temperature 98.1 F (36.7 C), temperature source Oral, height 6\' 5"  (1.956 m), weight 187 lb 9.6 oz (85.095 kg).  Oropharynx is without thrush or ulceration. Lungs are clear. Regular cardiac rhythm. Port-A-Cath site is without erythema. Abdomen is soft. No hepatomegaly. Extremities are without edema. Vibratory sense is moderately decreased at the fingertips bilaterally.  Lab Results: Lab Results  Component Value Date   WBC 4.8 05/04/2011   HGB 12.3* 05/04/2011   HCT 37.2* 05/04/2011   MCV 87.1 05/04/2011   PLT 111* 05/04/2011    Chemistry:    Chemistry      Component Value Date/Time   NA 139 04/20/2011 0824   K 4.0 04/20/2011 0824   CL 104 04/20/2011 0824   CO2 28 04/20/2011 0824   BUN 10 04/20/2011 0824   CREATININE 1.31 04/20/2011 0824      Component Value Date/Time   CALCIUM 9.1 04/20/2011 0824   ALKPHOS 94 04/20/2011 0824   AST 30 04/20/2011 0824   ALT 21 04/20/2011 0824   BILITOT 0.5 04/20/2011 0824       Studies/Results: No results found.  Medications: I have reviewed the patient's current medications.  Assessment/Plan:  1. 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 Status post cycle #1 adjuvant FOLFOX chemotherapy 12/01/2010. He completed cycle 2 beginning 12/15/2010. He completed cycle 3 beginning 01/05/2011 with Neulasta support. He completed cycle 10 on 04/20/2011. 2. History of a bowel  obstruction secondary to the primary colon mass. 3. History of abdominal pain/constipation secondary to the descending/sigmoid colon mass. 4. History of weight loss. Improved. 5. Status post Port-A-Cath placement 11/27/2010. 6. History of neutropenia secondary to chemotherapy. Cycle 3 FOLFOX was held 12/29/2010 due to neutropenia. He received Neulasta with cycle #3 FOLFOX chemotherapy on 01/05/2011. He did not receive Neulasta following cycle 4. Cycle 5 was held for one week due to neutropenia. He received Neulasta with subsequent treatments. Neulasta was not given with cycle 10 due to oxaliplatin being held. 7. Thrombocytopenia secondary to chemotherapy. Oxaliplatin was dose reduced beginning with cycle 7. 8. Back pain following cycle 3 FOLFOX. Likely related to Neulasta. 9. Oxaliplatin neuropathy with numbness in the fingertips and decreased vibratory sense, currently not interfering with activity. 10. Delayed nausea following cycle 7 of FOLFOX. Aloxi was added to the antiemetic regimen beginning with cycle 8 FOLFOX. He also began prophylactic Decadron on days 2 and 3 following chemotherapy with cycle 8. He did not have nausea following cycle 8 or cycle 9 FOLFOX.  Disposition-Patrick Woodward appears stable. Plan to proceed with cycle 11 FOLFOX today as scheduled. We will continue to hold the oxaliplatin due to neuropathy symptoms. He will return for a followup visit and cycle 12 FOLFOX in 2 weeks. He will contact the office in the interim with any problems.  Plan reviewed with Dr. Truett Perna.  Lonna Cobb ANP/GNP-BC

## 2011-05-04 NOTE — Telephone Encounter (Signed)
gv pt appt schedule for march/april °

## 2011-05-06 ENCOUNTER — Ambulatory Visit (HOSPITAL_BASED_OUTPATIENT_CLINIC_OR_DEPARTMENT_OTHER): Payer: Medicare Other

## 2011-05-06 VITALS — BP 99/64 | HR 107 | Temp 98.9°F

## 2011-05-06 DIAGNOSIS — Z452 Encounter for adjustment and management of vascular access device: Secondary | ICD-10-CM

## 2011-05-06 DIAGNOSIS — C189 Malignant neoplasm of colon, unspecified: Secondary | ICD-10-CM

## 2011-05-06 MED ORDER — HEPARIN SOD (PORK) LOCK FLUSH 100 UNIT/ML IV SOLN
500.0000 [IU] | Freq: Once | INTRAVENOUS | Status: AC
  Administered 2011-05-06: 500 [IU]
  Filled 2011-05-06: qty 5

## 2011-05-06 MED ORDER — SODIUM CHLORIDE 0.9 % IJ SOLN
10.0000 mL | INTRAMUSCULAR | Status: DC | PRN
  Administered 2011-05-06: 10 mL
  Filled 2011-05-06: qty 10

## 2011-05-06 NOTE — Patient Instructions (Signed)
Call MD for problems 

## 2011-05-16 ENCOUNTER — Other Ambulatory Visit: Payer: Self-pay | Admitting: Oncology

## 2011-05-18 ENCOUNTER — Inpatient Hospital Stay: Payer: Medicare Other

## 2011-05-18 ENCOUNTER — Ambulatory Visit: Payer: Medicare Other | Admitting: Oncology

## 2011-05-18 ENCOUNTER — Telehealth: Payer: Self-pay | Admitting: Oncology

## 2011-05-18 ENCOUNTER — Ambulatory Visit (HOSPITAL_BASED_OUTPATIENT_CLINIC_OR_DEPARTMENT_OTHER): Payer: Medicare Other

## 2011-05-18 ENCOUNTER — Other Ambulatory Visit (HOSPITAL_BASED_OUTPATIENT_CLINIC_OR_DEPARTMENT_OTHER): Payer: Medicare Other

## 2011-05-18 VITALS — BP 124/75 | HR 89 | Temp 97.4°F | Ht 77.0 in | Wt 192.6 lb

## 2011-05-18 DIAGNOSIS — C189 Malignant neoplasm of colon, unspecified: Secondary | ICD-10-CM

## 2011-05-18 DIAGNOSIS — G620 Drug-induced polyneuropathy: Secondary | ICD-10-CM

## 2011-05-18 DIAGNOSIS — Z5111 Encounter for antineoplastic chemotherapy: Secondary | ICD-10-CM

## 2011-05-18 DIAGNOSIS — T451X5A Adverse effect of antineoplastic and immunosuppressive drugs, initial encounter: Secondary | ICD-10-CM

## 2011-05-18 DIAGNOSIS — D6959 Other secondary thrombocytopenia: Secondary | ICD-10-CM

## 2011-05-18 LAB — CBC WITH DIFFERENTIAL/PLATELET
Basophils Absolute: 0 10*3/uL (ref 0.0–0.1)
EOS%: 1.6 % (ref 0.0–7.0)
HCT: 35.4 % — ABNORMAL LOW (ref 38.4–49.9)
HGB: 11.7 g/dL — ABNORMAL LOW (ref 13.0–17.1)
MCH: 29.9 pg (ref 27.2–33.4)
MONO#: 1 10*3/uL — ABNORMAL HIGH (ref 0.1–0.9)
NEUT#: 2.5 10*3/uL (ref 1.5–6.5)
RDW: 20.3 % — ABNORMAL HIGH (ref 11.0–14.6)
WBC: 4.7 10*3/uL (ref 4.0–10.3)
lymph#: 1.1 10*3/uL (ref 0.9–3.3)

## 2011-05-18 LAB — COMPREHENSIVE METABOLIC PANEL
AST: 28 U/L (ref 0–37)
Albumin: 3.7 g/dL (ref 3.5–5.2)
Alkaline Phosphatase: 69 U/L (ref 39–117)
Calcium: 9.4 mg/dL (ref 8.4–10.5)
Chloride: 102 mEq/L (ref 96–112)
Glucose, Bld: 105 mg/dL — ABNORMAL HIGH (ref 70–99)
Potassium: 3.8 mEq/L (ref 3.5–5.3)
Sodium: 138 mEq/L (ref 135–145)
Total Protein: 6.7 g/dL (ref 6.0–8.3)

## 2011-05-18 MED ORDER — FLUOROURACIL CHEMO INJECTION 2.5 GM/50ML
400.0000 mg/m2 | Freq: Once | INTRAVENOUS | Status: AC
  Administered 2011-05-18: 800 mg via INTRAVENOUS
  Filled 2011-05-18: qty 16

## 2011-05-18 MED ORDER — LEUCOVORIN CALCIUM INJECTION 350 MG
400.0000 mg/m2 | Freq: Once | INTRAVENOUS | Status: AC
  Administered 2011-05-18: 800 mg via INTRAVENOUS
  Filled 2011-05-18: qty 40

## 2011-05-18 MED ORDER — SODIUM CHLORIDE 0.9 % IV SOLN
2400.0000 mg/m2 | INTRAVENOUS | Status: DC
  Administered 2011-05-18: 4800 mg via INTRAVENOUS
  Filled 2011-05-18: qty 96

## 2011-05-18 NOTE — Telephone Encounter (Signed)
appts made and printed for pt aom °

## 2011-05-18 NOTE — Progress Notes (Signed)
OK to treat with platelet count 96.

## 2011-05-18 NOTE — Progress Notes (Signed)
OFFICE PROGRESS NOTE   INTERVAL HISTORY:   He completed another cycle of chemotherapy on 05/04/2011. He tolerated chemotherapy well. He denies nausea following chemotherapy. The numbness in the fingers and toes has improved. The numbness does not interfere with activity. No new complaint.  Objective:  Vital signs in last 24 hours:  Blood pressure 124/75, pulse 89, temperature 97.4 F (36.3 C), temperature source Oral, height 6\' 5"  (1.956 m), weight 192 lb 9.6 oz (87.363 kg).    HEENT: No thrush or ulcers Resp: Lungs clear bilaterally Cardio: Regular rate and rhythm GI: No hepatomegaly, left lower quadrant colostomy, nontender Vascular: No leg edema Neuro: The vibratory sense is moderately diminished at the fingertip bilaterally and severely diminished at the toes. Sensation is intact to light touch and temperature he is able to button his shirt and turn  pages in a magazine without difficulty.  Skin: Hyperpigmentation at the hands without erythema.  Portacath/PICC-without erythema  Lab Results:  Lab Results  Component Value Date   WBC 4.7 05/18/2011   HGB 11.7* 05/18/2011   HCT 35.4* 05/18/2011   MCV 90.2 05/18/2011   PLT 96* 05/18/2011      Medications: I have reviewed the patient's current medications.  Assessment/Plan: 1. 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 Status post cycle #1 adjuvant FOLFOX chemotherapy 12/01/2010. He completed cycle 2 beginning 12/15/2010. He completed cycle 3 beginning 01/05/2011 with Neulasta support. He completed cycle 11 on 05/04/2011. 2. History of a bowel obstruction secondary to the primary colon mass. 3. History of abdominal pain/constipation secondary to the descending/sigmoid colon mass. 4. History of weight loss. Improved. 5. Status post Port-A-Cath placement 11/27/2010. 6. History of neutropenia secondary to  chemotherapy. Cycle 3 FOLFOX was held 12/29/2010 due to neutropenia. He received Neulasta with cycle #3 FOLFOX chemotherapy on 01/05/2011. He did not receive Neulasta following cycle 4. Cycle 5 was held for one week due to neutropenia. He received Neulasta with subsequent treatments. Neulasta was held beginning with cycle 10 since the oxaliplatin was discontinued. 7.  Thrombocytopenia secondary to chemotherapy. Oxaliplatin was dose reduced beginning with cycle 7. 8. Back pain following cycle 3 FOLFOX. Likely related to Neulasta. 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 No interference with activity at present. 10. Delayed nausea following cycle 7 of FOLFOX. Aloxi was added to the antiemetic regimen beginning with cycle 8 FOLFOX. He also began prophylactic Decadron on days 2 and 3 following chemotherapy with cycle 8. He did not have nausea following cycle 8 or cycle 9 FOLFOX.  Disposition:  He will complete a final planned cycle of adjuvant chemotherapy today. Oxaliplatin has been discontinued due to neuropathy.  He will return for an office visit and CEA in one month.  Mr. Rebeca Alert at like to return to work. The neuropathy symptoms do not interfere with activity at present. He is the best judge of whether the neuropathy could interfere with his work activities. He will not resume work if there is any difficulty with driving or other work-related activity.   Lucile Shutters, MD  05/18/2011  8:25 PM

## 2011-05-20 ENCOUNTER — Ambulatory Visit (HOSPITAL_BASED_OUTPATIENT_CLINIC_OR_DEPARTMENT_OTHER): Payer: Medicare Other

## 2011-05-20 ENCOUNTER — Other Ambulatory Visit: Payer: Self-pay | Admitting: *Deleted

## 2011-05-20 VITALS — BP 131/79 | HR 117 | Temp 98.4°F

## 2011-05-20 DIAGNOSIS — Z452 Encounter for adjustment and management of vascular access device: Secondary | ICD-10-CM

## 2011-05-20 DIAGNOSIS — C189 Malignant neoplasm of colon, unspecified: Secondary | ICD-10-CM

## 2011-05-20 MED ORDER — HEPARIN SOD (PORK) LOCK FLUSH 100 UNIT/ML IV SOLN
500.0000 [IU] | Freq: Once | INTRAVENOUS | Status: AC
  Administered 2011-05-20: 500 [IU]
  Filled 2011-05-20: qty 5

## 2011-05-20 MED ORDER — SODIUM CHLORIDE 0.9 % IJ SOLN
10.0000 mL | INTRAMUSCULAR | Status: DC | PRN
  Administered 2011-05-20: 10 mL
  Filled 2011-05-20: qty 10

## 2011-06-15 ENCOUNTER — Ambulatory Visit: Payer: Medicare Other

## 2011-06-15 ENCOUNTER — Other Ambulatory Visit (HOSPITAL_BASED_OUTPATIENT_CLINIC_OR_DEPARTMENT_OTHER): Payer: Medicare Other | Admitting: Lab

## 2011-06-15 ENCOUNTER — Telehealth: Payer: Self-pay | Admitting: Oncology

## 2011-06-15 ENCOUNTER — Ambulatory Visit (HOSPITAL_BASED_OUTPATIENT_CLINIC_OR_DEPARTMENT_OTHER): Payer: Medicare Other | Admitting: Nurse Practitioner

## 2011-06-15 VITALS — BP 115/74 | HR 97 | Temp 97.7°F | Ht 77.0 in | Wt 197.0 lb

## 2011-06-15 DIAGNOSIS — D696 Thrombocytopenia, unspecified: Secondary | ICD-10-CM

## 2011-06-15 DIAGNOSIS — C189 Malignant neoplasm of colon, unspecified: Secondary | ICD-10-CM

## 2011-06-15 DIAGNOSIS — G5690 Unspecified mononeuropathy of unspecified upper limb: Secondary | ICD-10-CM

## 2011-06-15 DIAGNOSIS — G5790 Unspecified mononeuropathy of unspecified lower limb: Secondary | ICD-10-CM

## 2011-06-15 LAB — CBC WITH DIFFERENTIAL/PLATELET
Basophils Absolute: 0 10*3/uL (ref 0.0–0.1)
Eosinophils Absolute: 0.1 10*3/uL (ref 0.0–0.5)
HCT: 39.4 % (ref 38.4–49.9)
LYMPH%: 24 % (ref 14.0–49.0)
MONO#: 0.8 10*3/uL (ref 0.1–0.9)
NEUT#: 3.9 10*3/uL (ref 1.5–6.5)
NEUT%: 61.7 % (ref 39.0–75.0)
Platelets: 107 10*3/uL — ABNORMAL LOW (ref 140–400)
WBC: 6.3 10*3/uL (ref 4.0–10.3)
nRBC: 0 % (ref 0–0)

## 2011-06-15 MED ORDER — SODIUM CHLORIDE 0.9 % IJ SOLN
10.0000 mL | INTRAMUSCULAR | Status: DC | PRN
  Administered 2011-06-15: 10 mL via INTRAVENOUS
  Filled 2011-06-15: qty 10

## 2011-06-15 MED ORDER — HEPARIN SOD (PORK) LOCK FLUSH 100 UNIT/ML IV SOLN
500.0000 [IU] | Freq: Once | INTRAVENOUS | Status: AC
  Administered 2011-06-15: 500 [IU] via INTRAVENOUS
  Filled 2011-06-15: qty 5

## 2011-06-15 NOTE — Progress Notes (Signed)
OFFICE PROGRESS NOTE  Interval history:  Patrick Woodward returns as scheduled. He completed cycle 12 FOLFOX on 05/18/2011. He overall feels well. No nausea or vomiting. No mouth sores. No diarrhea. Colostomy is functioning normally. He has a good appetite. Overall good energy level. The fingertips and toes "feel burnt". This sensation does not interfere with function/activity.   Objective: Blood pressure 115/74, pulse 97, temperature 97.7 F (36.5 C), temperature source Oral, height 6\' 5"  (1.956 m), weight 197 lb (89.359 kg).  Oropharynx is without thrush or ulceration. Lungs are clear. Regular cardiac rhythm. Port-A-Cath site is without erythema. Abdomen is soft and nontender. No hepatomegaly. Left-sided colostomy. Extremities are without edema. Vibratory sense is moderately decreased over the fingertips and severely decreased over the toes.  Lab Results: Lab Results  Component Value Date   WBC 6.3 06/15/2011   HGB 13.1 06/15/2011   HCT 39.4 06/15/2011   MCV 88.9 06/15/2011   PLT 107* 06/15/2011    Chemistry:    Chemistry      Component Value Date/Time   NA 138 05/18/2011 0839   K 3.8 05/18/2011 0839   CL 102 05/18/2011 0839   CO2 29 05/18/2011 0839   BUN 11 05/18/2011 0839   CREATININE 1.58* 05/18/2011 0839      Component Value Date/Time   CALCIUM 9.4 05/18/2011 0839   ALKPHOS 69 05/18/2011 0839   AST 28 05/18/2011 0839   ALT 16 05/18/2011 0839   BILITOT 0.8 05/18/2011 0839       Studies/Results: No results found.  Medications: I have reviewed the patient's current medications.  Assessment/Plan:  1. 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 Status post cycle #1 adjuvant FOLFOX chemotherapy 12/01/2010. He completed cycle 2 beginning 12/15/2010. He completed cycle 3 beginning 01/05/2011 with Neulasta support. He completed cycle 11 on 05/04/2011. He completed the 12th and  final cycle on 05/18/2011. 2. History of a bowel obstruction secondary to the primary colon mass. 3. History of abdominal pain/constipation secondary to the descending/sigmoid colon mass. 4. History of weight loss. Improved. 5. Status post Port-A-Cath placement 11/27/2010. 6. History of neutropenia secondary to chemotherapy. Cycle 3 FOLFOX was held 12/29/2010 due to neutropenia. He received Neulasta with cycle #3 FOLFOX chemotherapy on 01/05/2011. He did not receive Neulasta following cycle 4. Cycle 5 was held for one week due to neutropenia. He received Neulasta with subsequent treatments. Neulasta was held beginning with cycle 10 since the oxaliplatin was discontinued. 7. Thrombocytopenia secondary to chemotherapy. Oxaliplatin was dose reduced beginning with cycle 7. 8. Back pain following cycle 3 FOLFOX. Likely related to Neulasta. 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 No interference with activity at present. 10. Delayed nausea following cycle 7 of FOLFOX. Aloxi was added to the antiemetic regimen beginning with cycle 8 FOLFOX. He also began prophylactic Decadron on days 2 and 3 following chemotherapy with cycle 8. He did not have nausea following cycle 8 or cycle 9 FOLFOX.  Disposition-Patrick Woodward appears stable. We will followup on the CEA from today. He will return for a followup visit in 3 months. 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 Patrick Woodward will contact the office prior to his next visit with any problems.  Plan reviewed with Dr. Truett Perna.  Patrick Fus,  Patrick Woodward ANP/GNP-BC

## 2011-06-15 NOTE — Telephone Encounter (Signed)
Gave pt appt calendar for June and August lab, MD and flush

## 2011-06-22 ENCOUNTER — Telehealth: Payer: Self-pay | Admitting: *Deleted

## 2011-06-22 NOTE — Telephone Encounter (Signed)
Needs MD to dictate letter that he is released and OK to return to work now. Is OK to mention his neuropathy in the letter. 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

## 2011-06-25 ENCOUNTER — Encounter: Payer: Self-pay | Admitting: Oncology

## 2011-06-25 NOTE — Progress Notes (Signed)
To Whom It May Concern:  Mr. Patrick Skolnick. Woodward is a patient at the [facility]. He has completed treatment. From an oncology standpoint he is able to begin work. However, he has developed significant neuropathy as a complication of chemotherapy.  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   Please contact me with any question regarding his cancer treatment.    Sincerely,     Mancel Bale, M.D.

## 2011-06-26 ENCOUNTER — Telehealth: Payer: Self-pay

## 2011-06-26 NOTE — Telephone Encounter (Signed)
Pt notified by phone his letter for work is ready for pick up.  Pt states "I'll come right now."  Letter with Wilma at front desk. dph

## 2011-06-30 ENCOUNTER — Telehealth: Payer: Self-pay | Admitting: *Deleted

## 2011-06-30 ENCOUNTER — Telehealth: Payer: Self-pay | Admitting: Oncology

## 2011-06-30 DIAGNOSIS — C189 Malignant neoplasm of colon, unspecified: Secondary | ICD-10-CM

## 2011-06-30 NOTE — Telephone Encounter (Signed)
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 Requests referral to [facility]. OK per Dr. Truett Perna.

## 2011-06-30 NOTE — Telephone Encounter (Signed)
Filled referral form and gave to [facility] @ [facility], waiting for appt date.

## 2011-07-02 ENCOUNTER — Telehealth: Payer: Self-pay | Admitting: *Deleted

## 2011-07-02 NOTE — Telephone Encounter (Signed)
Received fax that pt is scheduled for 07/03/11 at 1045. Fax stated pt is aware of appt.

## 2011-07-27 ENCOUNTER — Ambulatory Visit (HOSPITAL_BASED_OUTPATIENT_CLINIC_OR_DEPARTMENT_OTHER): Payer: Medicare Other

## 2011-07-27 VITALS — BP 116/70 | HR 92 | Temp 97.8°F

## 2011-07-27 DIAGNOSIS — C189 Malignant neoplasm of colon, unspecified: Secondary | ICD-10-CM

## 2011-07-27 DIAGNOSIS — Z452 Encounter for adjustment and management of vascular access device: Secondary | ICD-10-CM

## 2011-07-27 MED ORDER — HEPARIN SOD (PORK) LOCK FLUSH 100 UNIT/ML IV SOLN
500.0000 [IU] | Freq: Once | INTRAVENOUS | Status: AC
  Administered 2011-07-27: 500 [IU] via INTRAVENOUS
  Filled 2011-07-27: qty 5

## 2011-07-27 MED ORDER — SODIUM CHLORIDE 0.9 % IJ SOLN
10.0000 mL | INTRAMUSCULAR | Status: DC | PRN
  Administered 2011-07-27: 10 mL via INTRAVENOUS
  Filled 2011-07-27: qty 10

## 2011-08-21 ENCOUNTER — Encounter (INDEPENDENT_AMBULATORY_CARE_PROVIDER_SITE_OTHER): Payer: Self-pay | Admitting: Surgery

## 2011-08-21 ENCOUNTER — Ambulatory Visit (INDEPENDENT_AMBULATORY_CARE_PROVIDER_SITE_OTHER): Payer: Medicare Other | Admitting: Surgery

## 2011-08-21 VITALS — BP 112/68 | HR 76 | Temp 98.6°F | Resp 16 | Ht 77.5 in | Wt 198.6 lb

## 2011-08-21 DIAGNOSIS — Z85038 Personal history of other malignant neoplasm of large intestine: Secondary | ICD-10-CM

## 2011-08-21 NOTE — Patient Instructions (Signed)
We will schedule some tests prior to colostomy closure.  Return to clinic after to set up surgery.

## 2011-08-21 NOTE — Progress Notes (Signed)
Patient ID: Patrick Woodward, male   DOB: 1940/12/04, 71 y.o.   MRN: [id number]  Chief Complaint  Patient presents with  . Follow-up    LTF discuss colostomy takedown    HPI Patrick Woodward is a 71 y.o. male.   HPI The patient returns to clinic in followup history sigmoid colectomy and colostomy in September 2012 for stage III colon cancer. He had a colostomy due to obstruction and has just completed chemotherapy. He is doing well. He would like to have his colostomy reversed. He has no complaints.  Past Medical History  Diagnosis Date  . Colon cancer 10/2010    Stage III-mod differentiated adeno  . Colostomy in place   . Hypercholesteremia   . Hypertension   . Neuropathy     Past Surgical History  Procedure Date  . Portacath placement 11/27/10    Dr. Red Christians chest  . Colon surgery 10/20/10    colon resection w/colostomy/Hartmann pouch procedure    Family History  Problem Relation Age of Onset  . Cancer Mother     colon  . Cancer Father     prostate    Social History History  Substance Use Topics  . Smoking status: Former Games developer  . Smokeless tobacco: Never Used  . Alcohol Use: No    Allergies  Allergen Reactions  . Procardia (Nifedipine)     irratic heart rate     Current Outpatient Prescriptions  Medication Sig Dispense Refill  . aspirin 325 MG EC tablet Take 325 mg by mouth daily.      Marland Kitchen lidocaine-prilocaine (EMLA) cream Apply 1 application topically as needed.        . metoprolol (TOPROL-XL) 50 MG 24 hr tablet Take 50 mg by mouth daily.       . Multiple Vitamin (MULTIVITAMIN PO) Take by mouth daily.        Marland Kitchen NEXIUM 40 MG capsule Take 40 mg by mouth daily.       . ondansetron (ZOFRAN) 8 MG tablet Take 1 tablet (8 mg total) by mouth every 12 (twelve) hours as needed for nausea.  20 tablet  2  . prochlorperazine (COMPAZINE) 10 MG tablet Take 10 mg by mouth every 6 (six) hours as needed.        . vitamin E (VITAMIN E) 400 UNIT capsule Take 400  Units by mouth daily.        Marland Kitchen VYTORIN 10-20 MG per tablet Take 1 tablet by mouth at bedtime.         Review of Systems Review of Systems  Constitutional: Negative for fever, chills and unexpected weight change.  HENT: Negative for hearing loss, congestion, sore throat, trouble swallowing and voice change.   Eyes: Negative for visual disturbance.  Respiratory: Negative for cough and wheezing.   Cardiovascular: Negative for chest pain, palpitations and leg swelling.  Gastrointestinal: Negative for nausea, vomiting, abdominal pain, diarrhea, constipation, blood in stool, abdominal distention, anal bleeding and rectal pain.  Genitourinary: Negative for hematuria and difficulty urinating.  Musculoskeletal: Negative for arthralgias.  Skin: Negative for rash and wound.  Neurological: Negative for seizures, syncope, weakness and headaches.  Hematological: Negative for adenopathy. Does not bruise/bleed easily.  Psychiatric/Behavioral: Negative for confusion.    Blood pressure 112/68, pulse 76, temperature 98.6 F (37 C), temperature source Temporal, resp. rate 16, height 6' 5.5" (1.969 m), weight 198 lb 9.6 oz (90.084 kg).  Physical Exam Physical Exam  Constitutional: He is oriented to person, place, and time. He  appears well-developed and well-nourished.  HENT:  Head: Normocephalic and atraumatic.  Eyes: EOM are normal. Pupils are equal, round, and reactive to light.  Neck: Normal range of motion. Neck supple.  Cardiovascular: Normal rate and regular rhythm.   Pulmonary/Chest: Effort normal and breath sounds normal.  Abdominal: Soft. He exhibits no distension. There is no tenderness.    Musculoskeletal: Normal range of motion.  Neurological: He is alert and oriented to person, place, and time.  Skin: Skin is warm and dry.  Psychiatric: He has a normal mood and affect. His behavior is normal. Judgment and thought content normal.      Assessment    Sigmoid colectomy and colostomy  10/2010 due to large bowel obstruction for stage 3 colon cancer.  Doing well.    Plan    He wants his colostomy closed.  He will need colonoscopy and barium enema prior to closure.  Return to clinic once this is done.       Ravan Schlemmer A. 08/21/2011, 12:39 PM

## 2011-08-28 ENCOUNTER — Ambulatory Visit (HOSPITAL_COMMUNITY)
Admission: RE | Admit: 2011-08-28 | Discharge: 2011-08-28 | Disposition: A | Discharge status: Home / Self Care | Payer: Medicare Other | Source: Ambulatory Visit | Attending: Surgery | Admitting: Surgery

## 2011-08-28 ENCOUNTER — Other Ambulatory Visit (INDEPENDENT_AMBULATORY_CARE_PROVIDER_SITE_OTHER): Payer: Self-pay | Admitting: Surgery

## 2011-08-28 DIAGNOSIS — Z01818 Encounter for other preprocedural examination: Secondary | ICD-10-CM | POA: Insufficient documentation

## 2011-08-28 DIAGNOSIS — Z85038 Personal history of other malignant neoplasm of large intestine: Secondary | ICD-10-CM

## 2011-08-28 DIAGNOSIS — Z933 Colostomy status: Secondary | ICD-10-CM | POA: Insufficient documentation

## 2011-09-08 ENCOUNTER — Other Ambulatory Visit: Payer: Self-pay | Admitting: Gastroenterology

## 2011-09-14 ENCOUNTER — Ambulatory Visit (HOSPITAL_BASED_OUTPATIENT_CLINIC_OR_DEPARTMENT_OTHER): Payer: Medicare Other | Admitting: Oncology

## 2011-09-14 ENCOUNTER — Other Ambulatory Visit (HOSPITAL_BASED_OUTPATIENT_CLINIC_OR_DEPARTMENT_OTHER): Payer: Medicare Other | Admitting: Lab

## 2011-09-14 ENCOUNTER — Telehealth: Payer: Self-pay | Admitting: Oncology

## 2011-09-14 ENCOUNTER — Ambulatory Visit (HOSPITAL_BASED_OUTPATIENT_CLINIC_OR_DEPARTMENT_OTHER): Payer: Medicare Other

## 2011-09-14 VITALS — BP 117/70 | HR 78 | Temp 97.1°F | Resp 20 | Ht 77.5 in | Wt 201.4 lb

## 2011-09-14 DIAGNOSIS — C187 Malignant neoplasm of sigmoid colon: Secondary | ICD-10-CM

## 2011-09-14 DIAGNOSIS — C189 Malignant neoplasm of colon, unspecified: Secondary | ICD-10-CM

## 2011-09-14 DIAGNOSIS — D702 Other drug-induced agranulocytosis: Secondary | ICD-10-CM

## 2011-09-14 DIAGNOSIS — D696 Thrombocytopenia, unspecified: Secondary | ICD-10-CM

## 2011-09-14 DIAGNOSIS — R109 Unspecified abdominal pain: Secondary | ICD-10-CM

## 2011-09-14 LAB — CBC WITH DIFFERENTIAL/PLATELET
BASO%: 0.2 % (ref 0.0–2.0)
Basophils Absolute: 0 10*3/uL (ref 0.0–0.1)
EOS%: 1.7 % (ref 0.0–7.0)
HCT: 40 % (ref 38.4–49.9)
HGB: 13.3 g/dL (ref 13.0–17.1)
MCH: 27.6 pg (ref 27.2–33.4)
MONO#: 0.6 10*3/uL (ref 0.1–0.9)
RDW: 14.9 % — ABNORMAL HIGH (ref 11.0–14.6)
WBC: 4.6 10*3/uL (ref 4.0–10.3)
lymph#: 1.4 10*3/uL (ref 0.9–3.3)

## 2011-09-14 LAB — COMPREHENSIVE METABOLIC PANEL
AST: 27 U/L (ref 0–37)
Albumin: 4.1 g/dL (ref 3.5–5.2)
Alkaline Phosphatase: 60 U/L (ref 39–117)
Calcium: 9.1 mg/dL (ref 8.4–10.5)
Chloride: 107 mEq/L (ref 96–112)
Glucose, Bld: 87 mg/dL (ref 70–99)
Potassium: 4.2 mEq/L (ref 3.5–5.3)
Sodium: 141 mEq/L (ref 135–145)
Total Protein: 6.8 g/dL (ref 6.0–8.3)

## 2011-09-14 MED ORDER — HEPARIN SOD (PORK) LOCK FLUSH 100 UNIT/ML IV SOLN
500.0000 [IU] | Freq: Once | INTRAVENOUS | Status: AC
  Administered 2011-09-14: 500 [IU] via INTRAVENOUS
  Filled 2011-09-14: qty 5

## 2011-09-14 MED ORDER — SODIUM CHLORIDE 0.9 % IJ SOLN
10.0000 mL | INTRAMUSCULAR | Status: DC | PRN
  Administered 2011-09-14: 10 mL via INTRAVENOUS
  Filled 2011-09-14: qty 10

## 2011-09-14 NOTE — Telephone Encounter (Signed)
appt made and printed for pt aom °

## 2011-09-14 NOTE — Progress Notes (Signed)
[facility]    OFFICE PROGRESS NOTE   INTERVAL HISTORY:   He returns as scheduled. He underwent a colonoscopy on 09/08/2011. A polypoid lesion was found in the descending colon. Biopsies were taken and the pathology revealed a tubular adenoma. No high-grade dysplasia or malignancy.  He has noted a significant improvement in the neuropathy symptoms. He no longer has difficulty with fine motor tasks such as buttoning his shirt, tying his shoes, or reading the newspaper.  He plans to undergo a colostomy reversal procedure in the next one to 2 months.  Objective:  Vital signs in last 24 hours:  Blood pressure 117/70, pulse 78, temperature 97.1 F (36.2 C), temperature source Oral, resp. rate 20, height 6' 5.5" (1.969 m), weight 201 lb 6.4 oz (91.354 kg).    HEENT: Neck without mass Lymphatics: No cervical, supraclavicular, axillary, or inguinal nodes Resp: Bilateral mild inspiratory wheeze, no respiratory distress Cardio: Regular rate and rhythm GI: No hepatosplenomegaly, left lower quadrant colostomy with Brown stool, no mass Vascular: No leg edema Neuro: Mild to moderate decrease in vibratory sense at the fingertips    Portacath/PICC-without erythema  Lab Results:  Lab Results  Component Value Date   WBC 4.6 09/14/2011   HGB 13.3 09/14/2011   HCT 40.0 09/14/2011   MCV 83.0 09/14/2011   PLT 159 09/14/2011   ANC 2.5    Medications: I have reviewed the patient's current medications.  Assessment/Plan: 1. 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 Status post cycle #1 adjuvant FOLFOX chemotherapy 12/01/2010. He completed cycle 2 beginning 12/15/2010. He completed cycle 3 beginning 01/05/2011 with Neulasta support. He completed cycle 11 on 05/04/2011. He completed the 12th and final cycle on 05/18/2011.               -negative colonoscopy  09/08/2011, benign polypoid lesion in the descending colon 2. History of a bowel obstruction secondary to the primary colon mass. 3. History of abdominal pain/constipation secondary to the descending/sigmoid colon mass. 4. History of weight loss. Improved. 5. Status post Port-A-Cath placement 11/27/2010. 6. History of neutropenia secondary to chemotherapy. Cycle 3 FOLFOX was held 12/29/2010 due to neutropenia. He received Neulasta with cycle #3 FOLFOX chemotherapy on 01/05/2011. He did not receive Neulasta following cycle 4. Cycle 5 was held for one week due to neutropenia. He received Neulasta with subsequent treatments. Neulasta was held beginning with cycle 10 since the oxaliplatin was discontinued. 7. Thrombocytopenia secondary to chemotherapy. Oxaliplatin was dose reduced beginning with cycle 7. 8. Back pain following cycle 3 FOLFOX. Likely related to Neulasta. 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 No interference with activity at present. He reports significant improvement in the neuropathy symptoms over the past few months. 10. Delayed nausea following cycle 7 of FOLFOX. Aloxi was added to the antiemetic regimen beginning with cycle 8 FOLFOX. He also began prophylactic Decadron on days 2 and 3 following chemotherapy with cycle 8. He did not have nausea following cycle 8 or cycle 9 FOLFOX   Disposition:  He remains in clinical remission from colon cancer. The neuropathy symptoms have improved. He will be scheduled for a restaging CT evaluation in early September. He will return for an office visit here after the CTs. The Port-A-Cath was flushed today. He plans to schedule the colostomy reversal procedure with Dr.  Cornett for later in September. We will followup on the CEA from today.   Thornton Papas, MD  09/14/2011  1:20 PM

## 2011-10-02 ENCOUNTER — Ambulatory Visit (INDEPENDENT_AMBULATORY_CARE_PROVIDER_SITE_OTHER): Payer: Medicare Other | Admitting: Surgery

## 2011-10-02 ENCOUNTER — Encounter (INDEPENDENT_AMBULATORY_CARE_PROVIDER_SITE_OTHER): Payer: Self-pay | Admitting: Surgery

## 2011-10-02 VITALS — BP 122/80 | HR 84 | Resp 16 | Ht 77.5 in | Wt 198.8 lb

## 2011-10-02 DIAGNOSIS — Z85038 Personal history of other malignant neoplasm of large intestine: Secondary | ICD-10-CM

## 2011-10-02 NOTE — Patient Instructions (Signed)
Colostomy Reversal A colostomy reversal is surgery that reverses a colostomy procedure. The large intestine is disconnected from the opening in the abdomen (stoma). It is then reconnected to the large intestine inside the body. A stoma and pouch are no longer needed. Bowel movements can resume through the rectum.  LET YOUR CAREGIVER KNOW ABOUT:  Allergies to food or medicine.   Medicines taken, including vitamins, health supplements, herbs, eyedrops, over-the-counter medicines, and creams.   Use of steroids (by mouth or creams).   Previous problems with anesthetics or numbing medicines.   History of bleeding problems or blood clots.   Previous surgery.   Other health problems, including diabetes and kidney problems.   Possibility of pregnancy, if this applies.  RISKS AND COMPLICATIONS General surgical complications may include:  Reaction to anesthesia.   Damage to surrounding nerves, tissues, or structures.   Blood clot.   Bleeding.   Scarring.  Specific risks for colostomy reversal, while rare, may include:  Intestinal paralysis (ileus). This is a normal part of recovery. It usually goes away in 3 to 7 days. However, it can last longer in some people.   Leaking at the joined part of the intestine (anastamotic leak).   Infection of the surgical cut (incision) or the place where the stoma was located.   A collection of pus (abscess) in the abdomen or pelvis.   Intestinal blockage.   Narrowing at the joined part of the intestine (stricture).   Urinary and sexual dysfunction.  BEFORE THE PROCEDURE It is important to follow your surgeon's instructions prior to your procedure. This will help you to avoid complications. Steps before your procedure may include:  A physical exam, rectal exam, X-rays, colonoscopy, and other procedures.   Chemotherapy or radiation therapy if the stoma was created for cancer.   A review of the procedure, the anesthesia being used, and what  to expect after the procedure.  You may be asked to:  Stop taking certain medicines for several days prior to your procedure. This may include blood thinners (such as aspirin).   Take certain medicines, such as antibiotics or stool softeners.   Avoid eating and drinking after midnight the night before the procedure. This will help you to avoid complications from the anesthesia.   Quit smoking. Smoking increases the chances of a healing problem after your procedure.  PROCEDURE The length and complexity of the procedure depends on the type of colostomy you had. Reconnecting a loop colostomy is fairly simple. Reconnecting an end colostomy is more complex. You will be given medicine that makes you sleep (general anesthetic).The procedure may be done as open surgery, with a large incision. It may also be done as laparoscopic surgery, with several small incisions.  During a loop colostomy reversal, the surgeon will make a small incision around the stoma. The surgeon will stitch or staple the two ends of the intestine back together, inside the abdominal cavity. During an end colostomy reversal, the surgeon will make an incision in the abdomen or will perform the procedure laparoscopically. The surgeon will stitch or staple the intestine back together. This surgery takes longer and requires more recovery time. AFTER THE PROCEDURE  You will be given pain medicine.   Your caregivers will slowly increase your diet and movement.   After a loop colostomy reversal, you can expect to be in the hospital for about 3 to 5 days.   After an end colostomy reversal, you can expect to be in the hospital for about  5 to 10 days.   You should arrange for someone to help you with activities at home while you recover.  Document Released: 09/08/2010 Document Revised: 01/15/2011 Document Reviewed: 01/07/2011 [facility] Patient Information 2012 [place], [place].

## 2011-10-02 NOTE — Progress Notes (Signed)
Patient ID: Patrick Woodward, male   DOB: [birth year], 71 y.o.   MRN: [id number]  No chief complaint on file.   HPI Patrick Woodward is a 71 y.o. male.   HPI The patient returns to clinic in followup history sigmoid colectomy and colostomy in September 2012 for stage III colon cancer. He had a colostomy due to obstruction and has just completed chemotherapy. He is doing well. He would like to have his colostomy reversed. He has no complaints.  Past Medical History  Diagnosis Date  . Colon cancer 10/2010    Stage III-mod differentiated adeno  . Colostomy in place   . Hypercholesteremia   . Hypertension   . Neuropathy     Past Surgical History  Procedure Date  . Portacath placement 11/27/10    Dr. Red Christians chest  . Colon surgery 10/20/10    colon resection w/colostomy/Hartmann pouch procedure    Family History  Problem Relation Age of Onset  . Cancer Mother     colon  . Cancer Father     prostate    Social History History  Substance Use Topics  . Smoking status: Former Games developer  . Smokeless tobacco: Never Used  . Alcohol Use: No    Allergies  Allergen Reactions  . Procardia (Nifedipine)     irratic heart rate     Current Outpatient Prescriptions  Medication Sig Dispense Refill  . aspirin 325 MG EC tablet Take 325 mg by mouth daily.      Marland Kitchen lidocaine-prilocaine (EMLA) cream Apply 1 application topically as needed.        . metoprolol (TOPROL-XL) 50 MG 24 hr tablet Take 50 mg by mouth daily.       . Multiple Vitamin (MULTIVITAMIN PO) Take by mouth daily.        Marland Kitchen NEXIUM 40 MG capsule Take 40 mg by mouth daily.       . ondansetron (ZOFRAN) 8 MG tablet Take 1 tablet (8 mg total) by mouth every 12 (twelve) hours as needed for nausea.  20 tablet  2  . prochlorperazine (COMPAZINE) 10 MG tablet Take 10 mg by mouth every 6 (six) hours as needed.        . vitamin E (VITAMIN E) 400 UNIT capsule Take 400 Units by mouth daily.        Marland Kitchen VYTORIN 10-20 MG per tablet Take 1  tablet by mouth at bedtime.         Review of Systems Review of Systems  Constitutional: Negative for fever, chills and unexpected weight change.  HENT: Negative for hearing loss, congestion, sore throat, trouble swallowing and voice change.   Eyes: Negative for visual disturbance.  Respiratory: Negative for cough and wheezing.   Cardiovascular: Negative for chest pain, palpitations and leg swelling.  Gastrointestinal: Negative for nausea, vomiting, abdominal pain, diarrhea, constipation, blood in stool, abdominal distention, anal bleeding and rectal pain.  Genitourinary: Negative for hematuria and difficulty urinating.  Musculoskeletal: Negative for arthralgias.  Skin: Negative for rash and wound.  Neurological: Negative for seizures, syncope, weakness and headaches.  Hematological: Negative for adenopathy. Does not bruise/bleed easily.  Psychiatric/Behavioral: Negative for confusion.    Blood pressure 122/80, pulse 84, resp. rate 16, height 6' 5.5" (1.969 m), weight 198 lb 12.8 oz (90.175 kg).  Physical Exam Physical Exam  Constitutional: He is oriented to person, place, and time. He appears well-developed and well-nourished.  HENT:  Head: Normocephalic and atraumatic.  Eyes: EOM are normal. Pupils are equal, round,  and reactive to light.  Neck: Normal range of motion. Neck supple.  Cardiovascular: Normal rate and regular rhythm.   Pulmonary/Chest: Effort normal and breath sounds normal.  Abdominal: Soft. He exhibits no distension. There is no tenderness.    Musculoskeletal: Normal range of motion.  Neurological: He is alert and oriented to person, place, and time.  Skin: Skin is warm and dry.  Psychiatric: He has a normal mood and affect. His behavior is normal. Judgment and thought content normal.      DG Colon W/Cm - Wo/W Kub   Status: Final result       PACS Images     Show images for DG Colon W/Cm - Wo/W Kub      Study Result     *RADIOLOGY REPORT*     Clinical Data: Preop colostomy closure.   BE WITH CONTRAST - WITHOUT AND WITH KUB   Technique: Initial single column enema was performed through the rectum.  Following this, single column enema was performed through the ostomy.   Comparison:  CT 10/15/2010.  Barium enema 10/17/2010.   Findings: The patient experienced severe pain during insertion of the rectal tip and while filling of the rectosigmoid colon.  I am uncertain if the entire distal pouch was filled, but this portion of the study had to be terminated due to severe pain.  There is a small amount of residual stool within the rectosigmoid colon.  No other abnormality.   Filling of the remainder of the colon through the ostomy demonstrates a moderate amount of retained stool throughout the colon.  There are scattered diverticula within the transverse colon and descending colon.  No fixed stricture.   IMPRESSION: Scattered transverse and descending colonic diverticula.  No fixed stricture.  Visualized rectosigmoid colon grossly unremarkable.   Original Report Authenticated By: Cyndie Chime, M.D.       External Result Report     External Result Report       Imaging     Imaging Information       Signed by       Signed Date/Time   Phone Pager    Cyndie Chime 08/28/2011 11:02 AM EDT [phone]      Exam Information       Status Exam Begun   Exam Ended      Final [99] 08/28/2011  9:09 AM EDT 08/28/2011 10:44 AM EDT          Original Order       Ordered On Ordered By      Caleen Essex Aug 21, 2011 12:29 PM Mare Loan                DG Colon W/Cm - Wo/W Kub (Order [id number])  Imaging  : [id number]   Authorizing: Clovis Pu. Amori Colomb, MD  Department: [facility]   Date: 08/28/2011  Released By: Mare Loan        Order Information       Order Date/Time Release Date/Time Start Date/Time End Date/Time    08/28/2011 10:44 AM 08/28/2011  8:16 AM 08/28/2011 None               Order Details       Frequency Duration Priority Order Class    As needed 1  occurrence Routine Ancillary Performed              Imaging CC Recipients       Associated Diagnoses     History of  colon cancer                Collection Information       Resulting Agency    [facility]              Reviewed by List     Dortha Schwalbe   on Mon Sep 28, 2011  4:18 PM        Order Provider Info         Office phone Pager/beeper E-mail    Ordering User Mare Loan [phone] -- [email or web address]    Authorizing Provider Clovis Pu. Ruta Capece, MD [phone] -- --    Billing Provider Cyndie Chime, MD [phone] --      Order-Level Documents:     There are no order-level documents.      Reprint Requisition     DG Colon W/Cm - Wo/W Kub ([id number]) on 08/28/11      Original Order       Ordered On Ordered By      Caleen Essex Aug 21, 2011 12:29 PM Mare Loan             Assessment    Sigmoid colectomy and colostomy 10/2010 due to large bowel obstruction for stage 3 colon cancer.  Doing well.    Plan    Colostomy closure. Risk include bleeding, infection, anastomotic breakdown, poor wound healing, abscess, organ injury, injury to ureter, injury to bladder, small bowel injury, the need for further surgery, wound infection, hernia formation, cardiovascular events, death, blood clot formation, and pulmonary issues. He agrees to proceed.       Dalaney Needle A. 10/02/2011, 12:07 PM

## 2011-10-16 ENCOUNTER — Ambulatory Visit (HOSPITAL_COMMUNITY)
Admission: RE | Admit: 2011-10-16 | Discharge: 2011-10-16 | Disposition: A | Discharge status: Home / Self Care | Payer: Medicare Other | Source: Ambulatory Visit | Attending: Oncology | Admitting: Oncology

## 2011-10-16 DIAGNOSIS — Z933 Colostomy status: Secondary | ICD-10-CM | POA: Insufficient documentation

## 2011-10-16 DIAGNOSIS — K439 Ventral hernia without obstruction or gangrene: Secondary | ICD-10-CM | POA: Insufficient documentation

## 2011-10-16 DIAGNOSIS — K7689 Other specified diseases of liver: Secondary | ICD-10-CM | POA: Insufficient documentation

## 2011-10-16 DIAGNOSIS — C189 Malignant neoplasm of colon, unspecified: Secondary | ICD-10-CM | POA: Insufficient documentation

## 2011-10-16 MED ORDER — IOHEXOL 300 MG/ML  SOLN
100.0000 mL | Freq: Once | INTRAMUSCULAR | Status: AC | PRN
  Administered 2011-10-16: 100 mL via INTRAVENOUS

## 2011-10-20 ENCOUNTER — Telehealth: Payer: Self-pay | Admitting: Oncology

## 2011-10-20 ENCOUNTER — Ambulatory Visit (HOSPITAL_BASED_OUTPATIENT_CLINIC_OR_DEPARTMENT_OTHER): Payer: Medicare Other | Admitting: Nurse Practitioner

## 2011-10-20 ENCOUNTER — Other Ambulatory Visit: Payer: Medicare Other | Admitting: Lab

## 2011-10-20 VITALS — BP 133/74 | HR 88 | Temp 98.3°F | Resp 18 | Ht 77.5 in | Wt 200.0 lb

## 2011-10-20 DIAGNOSIS — C186 Malignant neoplasm of descending colon: Secondary | ICD-10-CM

## 2011-10-20 DIAGNOSIS — D6959 Other secondary thrombocytopenia: Secondary | ICD-10-CM

## 2011-10-20 DIAGNOSIS — C189 Malignant neoplasm of colon, unspecified: Secondary | ICD-10-CM

## 2011-10-20 DIAGNOSIS — G622 Polyneuropathy due to other toxic agents: Secondary | ICD-10-CM

## 2011-10-20 DIAGNOSIS — C187 Malignant neoplasm of sigmoid colon: Secondary | ICD-10-CM

## 2011-10-20 NOTE — Telephone Encounter (Signed)
Gave pt appt for Surgeon and MD visit in November and December 2013 MD visit only

## 2011-10-20 NOTE — Progress Notes (Signed)
OFFICE PROGRESS NOTE  Interval history:  Patrick Woodward returns as scheduled. Restaging CT evaluation 10/16/2011 showed no evidence of metastatic disease. He feels well. He has a good appetite and energy level. The neuropathy symptoms continue to improve. He has mild numbness in the fingertips and toes. The numbness does not interfere with activity. No nausea or vomiting. No abdominal pain. Colostomy is functioning normally.   Objective: Blood pressure 133/74, pulse 88, temperature 98.3 F (36.8 C), temperature source Oral, resp. rate 18, height 6' 5.5" (1.969 m), weight 200 lb (90.719 kg).  Oropharynx is without thrush or ulceration. No palpable cervical, supraclavicular or axillary lymph nodes. Lungs are clear. Regular cardiac rhythm. Port-A-Cath site is without erythema. Abdomen is soft and nontender. No hepatomegaly. Left lower quadrant colostomy. Extremities are without edema. Mild to moderate decrease in vibratory sense at the fingertips.  Lab Results: Lab Results  Component Value Date   WBC 4.6 09/14/2011   HGB 13.3 09/14/2011   HCT 40.0 09/14/2011   MCV 83.0 09/14/2011   PLT 159 09/14/2011    Chemistry:    Chemistry      Component Value Date/Time   NA 141 09/14/2011 1244   K 4.2 09/14/2011 1244   CL 107 09/14/2011 1244   CO2 26 09/14/2011 1244   BUN 15 09/14/2011 1244   CREATININE 1.37* 09/14/2011 1244      Component Value Date/Time   CALCIUM 9.1 09/14/2011 1244   ALKPHOS 60 09/14/2011 1244   AST 27 09/14/2011 1244   ALT 29 09/14/2011 1244   BILITOT 0.5 09/14/2011 1244       Studies/Results: Ct Chest W Contrast  10/16/2011  *RADIOLOGY REPORT*  Clinical Data:  Restaging colon cancer.  CT CHEST, ABDOMEN AND PELVIS WITH CONTRAST  Technique:  Multidetector CT imaging of the chest, abdomen and pelvis was performed following the standard protocol during bolus administration of intravenous contrast.  Contrast: OMNIPAQUE IOHEXOL 300 MG/ML  SOLN  Comparison:  CT scan 10/15/2010.  CT CHEST  Findings:   Examination of the chest wall demonstrates a left-sided Port-A-Cath in good position.  No complicating features.  There are small scattered axillary and supraclavicular lymph nodes but no mass or adenopathy.  The thyroid gland appears normal.  The bony thorax is intact.  Stable degenerative changes involving the thoracic spine.  No destructive bone lesions or spinal canal compromise.  The heart is normal in size.  No pericardial effusion.  Small scattered mediastinal and hilar lymph nodes but no adenopathy.  The aorta is normal in caliber.  No dissection.  The esophagus is unremarkable.  Examination of the lung parenchyma demonstrates no worrisome pulmonary nodules or acute pulmonary findings.  The tracheobronchial tree is unremarkable.  IMPRESSION: No CT findings for metastatic disease involving the chest.  CT ABDOMEN AND PELVIS  Findings:  Stable low attenuation liver lesions consistent with hepatic cysts.  No worrisome hepatic lesions or intrahepatic biliary dilatation.  The gallbladder is normal.  No common bile duct dilatation.  The pancreas is normal.  The spleen is normal in size.  No focal lesions.  The adrenal glands and kidneys are unremarkable and stable.  The stomach, duodenum, small bowel and colon are unremarkable. There is a left lower quadrant colostomy.  There is a Hartmann's pouch and the rectum and sigmoid colon containing a remote dens barium.  No mesenteric or retroperitoneal mass or adenopathy.  The aorta is normal in caliber.  The major branch vessels are normal. There is a small parastomal  hernia containing small bowel loops.  The bladder, prostate gland and seminal vesicles are unremarkable. No pelvic mass, adenopathy or free pelvic fluid collections.  A few small stable scattered lymph nodes are noted.  Small inguinal lymph nodes are noted.  The bony structures are unremarkable.  IMPRESSION:  1.  Stable hepatic cysts.  No worrisome hepatic lesions. 2.  No abdominal or pelvic  lymphadenopathy. 3.  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 4.  Hartmann's pouch containing dense contrast material.   Original Report Authenticated By: P. Loralie Champagne, M.D.    Ct Abdomen Pelvis W Contrast  10/16/2011  *RADIOLOGY REPORT*  Clinical Data:  Restaging colon cancer.  CT CHEST, ABDOMEN AND PELVIS WITH CONTRAST  Technique:  Multidetector CT imaging of the chest, abdomen and pelvis was performed following the standard protocol during bolus administration of intravenous contrast.  Contrast: OMNIPAQUE IOHEXOL 300 MG/ML  SOLN  Comparison:  CT scan 10/15/2010.  CT CHEST  Findings:  Examination of the chest wall demonstrates a left-sided Port-A-Cath in good position.  No complicating features.  There are small scattered axillary and supraclavicular lymph nodes but no mass or adenopathy.  The thyroid gland appears normal.  The bony thorax is intact.  Stable degenerative changes involving the thoracic spine.  No destructive bone lesions or spinal canal compromise.  The heart is normal in size.  No pericardial effusion.  Small scattered mediastinal and hilar lymph nodes but no adenopathy.  The aorta is normal in caliber.  No dissection.  The esophagus is unremarkable.  Examination of the lung parenchyma demonstrates no worrisome pulmonary nodules or acute pulmonary findings.  The tracheobronchial tree is unremarkable.  IMPRESSION: No CT findings for metastatic disease involving the chest.  CT ABDOMEN AND PELVIS  Findings:  Stable low attenuation liver lesions consistent with hepatic cysts.  No worrisome hepatic lesions or intrahepatic biliary dilatation.  The gallbladder is normal.  No common bile duct dilatation.  The pancreas is normal.  The spleen is normal in size.  No focal lesions.  The adrenal glands and kidneys are unremarkable and stable.  The stomach, duodenum, small bowel and colon are unremarkable. There is a left lower quadrant colostomy.  There is  a Hartmann's pouch and the rectum and sigmoid colon containing a remote dens barium.  No mesenteric or retroperitoneal mass or adenopathy.  The aorta is normal in caliber.  The major branch vessels are normal. There is a small parastomal hernia containing small bowel loops.  The bladder, prostate gland and seminal vesicles are unremarkable. No pelvic mass, adenopathy or free pelvic fluid collections.  A few small stable scattered lymph nodes are noted.  Small inguinal lymph nodes are noted.  The bony structures are unremarkable.  IMPRESSION:  1.  Stable hepatic cysts.  No worrisome hepatic lesions. 2.  No abdominal or pelvic lymphadenopathy. 3.  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 4.  Hartmann's pouch containing dense contrast material.   Original Report Authenticated By: P. Loralie Champagne, M.D.     Medications: I have reviewed the patient's current medications.  Assessment/Plan:  1. 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 Status post cycle #1 adjuvant FOLFOX chemotherapy 12/01/2010. He completed cycle 2 beginning 12/15/2010. He completed cycle 3 beginning 01/05/2011 with Neulasta support. He completed cycle 11 on 05/04/2011. He completed the 12th and final cycle on  05/18/2011. Negative colonoscopy 09/08/2011, benign polypoid lesion in the descending colon. CEA 1.3 on 09/14/2011. Restaging CT scans 10/16/2011 with no evidence of metastatic disease. 2. History of a bowel obstruction secondary to the primary colon mass. 3. History of abdominal pain/constipation secondary to the descending/sigmoid colon mass. 4. History of weight loss. Improved. 5. Status post Port-A-Cath placement 11/27/2010. 6. History of neutropenia secondary to chemotherapy. Cycle 3 FOLFOX was held 12/29/2010 due to neutropenia. He received Neulasta with cycle #3  FOLFOX chemotherapy on 01/05/2011. He did not receive Neulasta following cycle 4. Cycle 5 was held for one week due to neutropenia. He received Neulasta with subsequent treatments. Neulasta was held beginning with cycle 10 since the oxaliplatin was discontinued. 7. Thrombocytopenia secondary to chemotherapy. Oxaliplatin was dose reduced beginning with cycle 7. 8. Back pain following cycle 3 FOLFOX. Likely related to Neulasta. 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 No interference with activity at present. Neuropathy symptoms continue to improve. 10. Delayed nausea following cycle 7 of FOLFOX. Aloxi was added to the antiemetic regimen beginning with cycle 8 FOLFOX. He also began prophylactic Decadron on days 2 and 3 following chemotherapy with cycle 8. He did not have nausea following cycle 8 or cycle 9 FOLFOX  Disposition-Mr. Gronau appears stable. He remains in remission from the colon cancer. He is scheduled to undergo reversal of the colostomy in October. We will request Port-A-Cath removal at that time as well. He will return for a followup visit with Dr. Truett Perna in 3 months. He will contact the office in the interim with any problems.  Plan reviewed with Dr. Truett Perna.  Patrick Woodward ANP/GNP-BC

## 2011-10-26 ENCOUNTER — Encounter (INDEPENDENT_AMBULATORY_CARE_PROVIDER_SITE_OTHER): Payer: Self-pay

## 2011-11-17 ENCOUNTER — Encounter (HOSPITAL_COMMUNITY): Payer: Self-pay | Admitting: Pharmacy Technician

## 2011-11-30 ENCOUNTER — Other Ambulatory Visit (INDEPENDENT_AMBULATORY_CARE_PROVIDER_SITE_OTHER): Payer: Self-pay | Admitting: Surgery

## 2011-11-30 ENCOUNTER — Encounter (HOSPITAL_COMMUNITY)
Admission: RE | Admit: 2011-11-30 | Discharge: 2011-11-30 | Disposition: A | Discharge status: Home / Self Care | Payer: Medicare Other | Source: Ambulatory Visit | Attending: Surgery | Admitting: Surgery

## 2011-11-30 ENCOUNTER — Encounter (HOSPITAL_COMMUNITY): Payer: Self-pay

## 2011-11-30 HISTORY — DX: Gastro-esophageal reflux disease without esophagitis: K21.9

## 2011-11-30 LAB — CBC WITH DIFFERENTIAL/PLATELET
Basophils Absolute: 0 10*3/uL (ref 0.0–0.1)
Basophils Relative: 0 % (ref 0–1)
HCT: 42 % (ref 39.0–52.0)
Lymphocytes Relative: 30 % (ref 12–46)
Monocytes Absolute: 0.6 10*3/uL (ref 0.1–1.0)
Neutro Abs: 3.5 10*3/uL (ref 1.7–7.7)
Neutrophils Relative %: 58 % (ref 43–77)
Platelets: 150 10*3/uL (ref 150–400)
RDW: 16.1 % — ABNORMAL HIGH (ref 11.5–15.5)
WBC: 6 10*3/uL (ref 4.0–10.5)

## 2011-11-30 LAB — COMPREHENSIVE METABOLIC PANEL
ALT: 21 U/L (ref 0–53)
AST: 28 U/L (ref 0–37)
Albumin: 3.9 g/dL (ref 3.5–5.2)
CO2: 27 mEq/L (ref 19–32)
Chloride: 102 mEq/L (ref 96–112)
GFR calc non Af Amer: 50 mL/min — ABNORMAL LOW (ref >90)
Potassium: 4.4 mEq/L (ref 3.5–5.1)
Sodium: 138 mEq/L (ref 135–145)
Total Bilirubin: 0.4 mg/dL (ref 0.3–1.2)

## 2011-11-30 LAB — SURGICAL PCR SCREEN
MRSA, PCR: NEGATIVE
Staphylococcus aureus: POSITIVE — AB

## 2011-11-30 NOTE — Pre-Procedure Instructions (Signed)
20 Patrick Woodward  11/30/2011   Your procedure is scheduled on:  Wednesday December 02, 2011  Report to [facility] at 6:30 AM.  Call this number if you have problems the morning of surgery: [phone]   Remember:   Do not eat food or drink :After Midnight.      Take these medicines the morning of surgery with A SIP OF WATER: metoprolol, nexium, DISCONTINUE ASPIRIN, COUMADIN, PLAVIX, EFFIENT, AND HERBAL MEDICATIONS   Do not wear jewelry, make-up or nail polish.  Do not wear lotions, powders, or perfumes.   Do not shave 48 hours prior to surgery. Men may shave face and neck.  Do not bring valuables to the hospital.  Contacts, dentures or bridgework may not be worn into surgery.  Leave suitcase in the car. After surgery it may be brought to your room.  For patients admitted to the hospital, checkout time is 11:00 AM the day of discharge.   Patients discharged the day of surgery will not be allowed to drive home.  Name and phone number of your driver: FAMILY  / FRIEND  Special Instructions: Shower using CHG 2 nights before surgery and the night before surgery.  If you shower the day of surgery use CHG.  Use special wash - you have one bottle of CHG for all showers.  You should use approximately 1/3 of the bottle for each shower.   Please read over the following fact sheets that you were given: Pain Booklet, Coughing and Deep Breathing, MRSA Information and Surgical Site Infection Prevention

## 2011-11-30 NOTE — Progress Notes (Signed)
Patient denied having a stress test, cardiac cath, or sleep study. Dr. Thornton Papas is oncologist. PCP is Dr. Andi Devon at [facility]. Dr. Bosie Clos on [address] handles patient's colostomy.

## 2011-12-01 MED ORDER — CEFAZOLIN SODIUM-DEXTROSE 2-3 GM-% IV SOLR
2.0000 g | INTRAVENOUS | Status: DC
  Filled 2011-12-01: qty 50

## 2011-12-01 MED ORDER — CHLORHEXIDINE GLUCONATE 4 % EX LIQD: 1.0000 "application " | Freq: Once | CUTANEOUS | Status: DC

## 2011-12-01 NOTE — H&P (Signed)
Patrick Woodward   MRN: [id number]   Description: 71 year old male  Provider: Dortha Schwalbe., MD  Department: [facility]        Diagnoses     History of colon cancer   - Primary    V10.05         Vitals - Last Recorded       BP Pulse Resp Ht Wt BMI    122/80 84 16 6' 5.5" (1.969 m) 198 lb 12.8 oz (90.175 kg) 23.27 kg/m2       Progress Notes   Patient ID: Patrick Woodward, male   DOB: 1940/10/25, 71 y.o.   MRN: [id number]   No chief complaint on file.   HPI Patrick Woodward is a 71 y.o. male.   HPI The patient returns to clinic in followup history sigmoid colectomy and colostomy in September 2012 for stage III colon cancer. He had a colostomy due to obstruction and has just completed chemotherapy. He is doing well. He would like to have his colostomy reversed. He has no complaints.    Past Medical History   Diagnosis  Date   .  Colon cancer  10/2010       Stage III-mod differentiated adeno   .  Colostomy in place     .  Hypercholesteremia     .  Hypertension     .  Neuropathy         Past Surgical History   Procedure  Date   .  Portacath placement  11/27/10       Dr. Red Christians chest   .  Colon surgery  10/20/10       colon resection w/colostomy/Hartmann pouch procedure       Family History   Problem  Relation  Age of Onset   .  Cancer  Mother         colon   .  Cancer  Father         prostate      Social History History   Substance Use Topics   .  Smoking status:  Former Games developer   .  Smokeless tobacco:  Never Used   .  Alcohol Use:  No       Allergies   Allergen  Reactions   .  Procardia (Nifedipine)         irratic heart rate        Current Outpatient Prescriptions   Medication  Sig  Dispense  Refill   .  aspirin 325 MG EC tablet  Take 325 mg by mouth daily.         Marland Kitchen  lidocaine-prilocaine (EMLA) cream  Apply 1 application topically as needed.           .  metoprolol (TOPROL-XL) 50 MG 24 hr tablet  Take 50 mg by mouth  daily.          .  Multiple Vitamin (MULTIVITAMIN PO)  Take by mouth daily.           Marland Kitchen  NEXIUM 40 MG capsule  Take 40 mg by mouth daily.          .  ondansetron (ZOFRAN) 8 MG tablet  Take 1 tablet (8 mg total) by mouth every 12 (twelve) hours as needed for nausea.   20 tablet   2   .  prochlorperazine (COMPAZINE) 10 MG tablet  Take 10 mg by mouth every 6 (six) hours as needed.           Marland Kitchen  vitamin E (VITAMIN E) 400 UNIT capsule  Take 400 Units by mouth daily.           Marland Kitchen  VYTORIN 10-20 MG per tablet  Take 1 tablet by mouth at bedtime.             Review of Systems Review of Systems  Constitutional: Negative for fever, chills and unexpected weight change.  HENT: Negative for hearing loss, congestion, sore throat, trouble swallowing and voice change.   Eyes: Negative for visual disturbance.  Respiratory: Negative for cough and wheezing.   Cardiovascular: Negative for chest pain, palpitations and leg swelling.  Gastrointestinal: Negative for nausea, vomiting, abdominal pain, diarrhea, constipation, blood in stool, abdominal distention, anal bleeding and rectal pain.  Genitourinary: Negative for hematuria and difficulty urinating.  Musculoskeletal: Negative for arthralgias.  Skin: Negative for rash and wound.  Neurological: Negative for seizures, syncope, weakness and headaches.  Hematological: Negative for adenopathy. Does not bruise/bleed easily.  Psychiatric/Behavioral: Negative for confusion.      Blood pressure 122/80, pulse 84, resp. rate 16, height 6' 5.5" (1.969 m), weight 198 lb 12.8 oz (90.175 kg).   Physical Exam Physical Exam  Constitutional: He is oriented to person, place, and time. He appears well-developed and well-nourished.  HENT:   Head: Normocephalic and atraumatic.  Eyes: EOM are normal. Pupils are equal, round, and reactive to light.  Neck: Normal range of motion. Neck supple.  Cardiovascular: Normal rate and regular rhythm.   Pulmonary/Chest: Effort normal  and breath sounds normal.  Abdominal: Soft. He exhibits no distension. There is no tenderness.    Musculoskeletal: Normal range of motion.  Neurological: He is alert and oriented to person, place, and time.  Skin: Skin is warm and dry.  Psychiatric: He has a normal mood and affect. His behavior is normal. Judgment and thought content normal.          DG Colon W/Cm - Wo/W Kub    Status: Final result          PACS Images      Show images for DG Colon W/Cm - Wo/W Kub        Study Result      *RADIOLOGY REPORT*    Clinical Data: Preop colostomy closure.    BE WITH CONTRAST - WITHOUT AND WITH KUB    Technique: Initial single column enema was performed through the rectum.  Following this, single column enema was performed through the ostomy.    Comparison:  CT 10/15/2010.  Barium enema 10/17/2010.    Findings: The patient experienced severe pain during insertion of the rectal tip and while filling of the rectosigmoid colon.  I am uncertain if the entire distal pouch was filled, but this portion of the study had to be terminated due to severe pain.  There is a small amount of residual stool within the rectosigmoid colon.  No other abnormality.    Filling of the remainder of the colon through the ostomy demonstrates a moderate amount of retained stool throughout the colon.  There are scattered diverticula within the transverse colon and descending colon.  No fixed stricture.    IMPRESSION: Scattered transverse and descending colonic diverticula.  No fixed stricture.  Visualized rectosigmoid colon grossly unremarkable.    Original Report Authenticated By: Cyndie Chime, M.D.            Assessment Sigmoid colectomy and colostomy 10/2010 due to large bowel obstruction for stage 3 colon cancer.  Doing well.   Plan  Colostomy closure. Risk include bleeding, infection, anastomotic breakdown, poor wound healing, abscess, organ injury, injury to ureter, injury to  bladder, small bowel injury, the need for further surgery, wound infection, hernia formation, cardiovascular events, death, blood clot formation, and pulmonary issues. He agrees to proceed.       Berit Raczkowski A. 12/01/2011

## 2011-12-02 ENCOUNTER — Encounter (HOSPITAL_COMMUNITY): Payer: Self-pay | Admitting: General Practice

## 2011-12-02 ENCOUNTER — Encounter (HOSPITAL_COMMUNITY): Payer: Self-pay | Admitting: Certified Registered"

## 2011-12-02 ENCOUNTER — Ambulatory Visit (HOSPITAL_COMMUNITY): Payer: Medicare Other | Admitting: Certified Registered"

## 2011-12-02 ENCOUNTER — Encounter (HOSPITAL_COMMUNITY)
Admission: RE | Disposition: A | Discharge status: Home / Self Care | Payer: Self-pay | Source: Ambulatory Visit | Attending: Surgery

## 2011-12-02 ENCOUNTER — Encounter (HOSPITAL_COMMUNITY): Payer: Self-pay | Admitting: *Deleted

## 2011-12-02 ENCOUNTER — Inpatient Hospital Stay (HOSPITAL_COMMUNITY)
Admission: RE | Admit: 2011-12-02 | Discharge: 2011-12-06 | DRG: 345 | Disposition: A | Discharge status: Home / Self Care | Payer: Medicare Other | Source: Ambulatory Visit | Attending: Surgery | Admitting: Surgery

## 2011-12-02 DIAGNOSIS — I10 Essential (primary) hypertension: Secondary | ICD-10-CM | POA: Diagnosis present

## 2011-12-02 DIAGNOSIS — Z433 Encounter for attention to colostomy: Secondary | ICD-10-CM

## 2011-12-02 DIAGNOSIS — K219 Gastro-esophageal reflux disease without esophagitis: Secondary | ICD-10-CM | POA: Diagnosis present

## 2011-12-02 DIAGNOSIS — Z452 Encounter for adjustment and management of vascular access device: Secondary | ICD-10-CM

## 2011-12-02 DIAGNOSIS — C189 Malignant neoplasm of colon, unspecified: Secondary | ICD-10-CM | POA: Diagnosis present

## 2011-12-02 DIAGNOSIS — Z01812 Encounter for preprocedural laboratory examination: Secondary | ICD-10-CM

## 2011-12-02 HISTORY — PX: PORT-A-CATH REMOVAL: SHX5289

## 2011-12-02 HISTORY — PX: COLOSTOMY CLOSURE: SHX1381

## 2011-12-02 LAB — CBC
HCT: 39 % (ref 39.0–52.0)
Hemoglobin: 12.9 g/dL — ABNORMAL LOW (ref 13.0–17.0)
MCH: 27.4 pg (ref 26.0–34.0)
MCHC: 33.1 g/dL (ref 30.0–36.0)
RDW: 16.1 % — ABNORMAL HIGH (ref 11.5–15.5)

## 2011-12-02 LAB — CREATININE, SERUM: GFR calc non Af Amer: 57 mL/min — ABNORMAL LOW (ref >90)

## 2011-12-02 SURGERY — COLOSTOMY CLOSURE
Anesthesia: General | Site: Chest | Wound class: Clean Contaminated

## 2011-12-02 MED ORDER — BUPIVACAINE-EPINEPHRINE PF 0.25-1:200000 % IJ SOLN
INTRAMUSCULAR | Status: AC
  Filled 2011-12-02: qty 30

## 2011-12-02 MED ORDER — ENOXAPARIN SODIUM 40 MG/0.4ML ~~LOC~~ SOLN
40.0000 mg | SUBCUTANEOUS | Status: DC
  Administered 2011-12-03 – 2011-12-06 (×4): 40 mg via SUBCUTANEOUS
  Filled 2011-12-02 (×5): qty 0.4

## 2011-12-02 MED ORDER — KETOROLAC TROMETHAMINE 30 MG/ML IJ SOLN
INTRAMUSCULAR | Status: AC
  Administered 2011-12-02: 15 mg
  Filled 2011-12-02: qty 1

## 2011-12-02 MED ORDER — MIDAZOLAM HCL 5 MG/5ML IJ SOLN
INTRAMUSCULAR | Status: DC | PRN
  Administered 2011-12-02: 2 mg via INTRAVENOUS

## 2011-12-02 MED ORDER — HYDROMORPHONE 0.3 MG/ML IV SOLN
INTRAVENOUS | Status: AC
  Filled 2011-12-02: qty 25

## 2011-12-02 MED ORDER — ONDANSETRON HCL 4 MG/2ML IJ SOLN: 4.0000 mg | Freq: Four times a day (QID) | INTRAMUSCULAR | Status: DC | PRN

## 2011-12-02 MED ORDER — ROCURONIUM BROMIDE 100 MG/10ML IV SOLN
INTRAVENOUS | Status: DC | PRN
  Administered 2011-12-02: 50 mg via INTRAVENOUS

## 2011-12-02 MED ORDER — PROPOFOL 10 MG/ML IV BOLUS
INTRAVENOUS | Status: DC | PRN
  Administered 2011-12-02: 130 mg via INTRAVENOUS

## 2011-12-02 MED ORDER — SODIUM CHLORIDE 0.9 % IJ SOLN: 9.0000 mL | INTRAMUSCULAR | Status: DC | PRN

## 2011-12-02 MED ORDER — PNEUMOCOCCAL VAC POLYVALENT 25 MCG/0.5ML IJ INJ
0.5000 mL | INJECTION | INTRAMUSCULAR | Status: AC
  Administered 2011-12-03: 0.5 mL via INTRAMUSCULAR
  Filled 2011-12-02: qty 0.5

## 2011-12-02 MED ORDER — ONDANSETRON HCL 4 MG PO TABS: 4.0000 mg | ORAL_TABLET | Freq: Four times a day (QID) | ORAL | Status: DC | PRN

## 2011-12-02 MED ORDER — 0.9 % SODIUM CHLORIDE (POUR BTL) OPTIME
TOPICAL | Status: DC | PRN
  Administered 2011-12-02 (×3): 1000 mL

## 2011-12-02 MED ORDER — FENTANYL CITRATE 0.05 MG/ML IJ SOLN
INTRAMUSCULAR | Status: DC | PRN
  Administered 2011-12-02 (×2): 50 ug via INTRAVENOUS
  Administered 2011-12-02: 100 ug via INTRAVENOUS
  Administered 2011-12-02: 50 ug via INTRAVENOUS
  Administered 2011-12-02: 100 ug via INTRAVENOUS

## 2011-12-02 MED ORDER — BUPIVACAINE-EPINEPHRINE 0.25% -1:200000 IJ SOLN
INTRAMUSCULAR | Status: DC | PRN
  Administered 2011-12-02: 30 mL

## 2011-12-02 MED ORDER — SODIUM CHLORIDE 0.9 % IV SOLN
1.0000 g | INTRAVENOUS | Status: DC
  Administered 2011-12-02: 1 g via INTRAVENOUS
  Filled 2011-12-02: qty 1

## 2011-12-02 MED ORDER — HYDROMORPHONE HCL PF 1 MG/ML IJ SOLN
0.2500 mg | INTRAMUSCULAR | Status: DC | PRN
  Administered 2011-12-02 (×2): 0.5 mg via INTRAVENOUS

## 2011-12-02 MED ORDER — BIOTENE DRY MOUTH MT LIQD
15.0000 mL | Freq: Two times a day (BID) | OROMUCOSAL | Status: DC
  Administered 2011-12-03 – 2011-12-04 (×4): 15 mL via OROMUCOSAL

## 2011-12-02 MED ORDER — DIPHENHYDRAMINE HCL 50 MG/ML IJ SOLN: 12.5000 mg | Freq: Four times a day (QID) | INTRAMUSCULAR | Status: DC | PRN

## 2011-12-02 MED ORDER — KCL IN DEXTROSE-NACL 20-5-0.45 MEQ/L-%-% IV SOLN
INTRAVENOUS | Status: AC
  Filled 2011-12-02: qty 1000

## 2011-12-02 MED ORDER — EZETIMIBE-SIMVASTATIN 10-20 MG PO TABS
1.0000 | ORAL_TABLET | Freq: Every day | ORAL | Status: DC
  Administered 2011-12-02 – 2011-12-05 (×4): 1 via ORAL
  Filled 2011-12-02 (×7): qty 1

## 2011-12-02 MED ORDER — ONDANSETRON HCL 4 MG/2ML IJ SOLN
INTRAMUSCULAR | Status: DC | PRN
  Administered 2011-12-02: 4 mg via INTRAVENOUS

## 2011-12-02 MED ORDER — CHLORHEXIDINE GLUCONATE 0.12 % MT SOLN
15.0000 mL | Freq: Two times a day (BID) | OROMUCOSAL | Status: DC
  Administered 2011-12-02 – 2011-12-04 (×5): 15 mL via OROMUCOSAL
  Filled 2011-12-02 (×3): qty 15

## 2011-12-02 MED ORDER — MUPIROCIN 2 % EX OINT
TOPICAL_OINTMENT | CUTANEOUS | Status: AC
  Filled 2011-12-02: qty 22

## 2011-12-02 MED ORDER — NALOXONE HCL 0.4 MG/ML IJ SOLN: 0.4000 mg | INTRAMUSCULAR | Status: DC | PRN

## 2011-12-02 MED ORDER — KCL IN DEXTROSE-NACL 20-5-0.45 MEQ/L-%-% IV SOLN
INTRAVENOUS | Status: DC
  Administered 2011-12-02 – 2011-12-04 (×5): via INTRAVENOUS
  Administered 2011-12-04: 100 mL via INTRAVENOUS
  Administered 2011-12-05: 21:00:00 via INTRAVENOUS
  Filled 2011-12-02 (×9): qty 1000

## 2011-12-02 MED ORDER — NEOSTIGMINE METHYLSULFATE 1 MG/ML IJ SOLN
INTRAMUSCULAR | Status: DC | PRN
  Administered 2011-12-02: 4 mg via INTRAVENOUS

## 2011-12-02 MED ORDER — ACETAMINOPHEN 10 MG/ML IV SOLN
INTRAVENOUS | Status: AC
  Administered 2011-12-02: 1000 mg via INTRAVENOUS
  Filled 2011-12-02: qty 100

## 2011-12-02 MED ORDER — HYDROMORPHONE HCL PF 1 MG/ML IJ SOLN
INTRAMUSCULAR | Status: AC
  Administered 2011-12-02: 0.5 mg via INTRAVENOUS
  Filled 2011-12-02: qty 1

## 2011-12-02 MED ORDER — LACTATED RINGERS IV SOLN
INTRAVENOUS | Status: DC | PRN
  Administered 2011-12-02 (×2): via INTRAVENOUS

## 2011-12-02 MED ORDER — ONDANSETRON HCL 4 MG/2ML IJ SOLN: 4.0000 mg | Freq: Once | INTRAMUSCULAR | Status: DC | PRN

## 2011-12-02 MED ORDER — ACETAMINOPHEN 10 MG/ML IV SOLN
1000.0000 mg | Freq: Once | INTRAVENOUS | Status: AC | PRN
  Administered 2011-12-02: 1000 mg via INTRAVENOUS

## 2011-12-02 MED ORDER — KETOROLAC TROMETHAMINE 15 MG/ML IJ SOLN: 15.0000 mg | Freq: Four times a day (QID) | INTRAMUSCULAR | Status: DC | PRN

## 2011-12-02 MED ORDER — LIDOCAINE HCL (CARDIAC) 20 MG/ML IV SOLN
INTRAVENOUS | Status: DC | PRN
  Administered 2011-12-02: 40 mg via INTRAVENOUS

## 2011-12-02 MED ORDER — ALVIMOPAN 12 MG PO CAPS
12.0000 mg | ORAL_CAPSULE | Freq: Once | ORAL | Status: DC
  Filled 2011-12-02: qty 1

## 2011-12-02 MED ORDER — HYDROMORPHONE 0.3 MG/ML IV SOLN
INTRAVENOUS | Status: DC
  Administered 2011-12-02: 12:00:00 via INTRAVENOUS
  Administered 2011-12-02: 3.2 mg via INTRAVENOUS
  Administered 2011-12-03: 3 mg via INTRAVENOUS
  Administered 2011-12-03: 1.69 mg via INTRAVENOUS
  Administered 2011-12-03: 22:00:00 via INTRAVENOUS
  Administered 2011-12-03: 6.7 mg via INTRAVENOUS
  Administered 2011-12-03: 1.8 mg via INTRAVENOUS
  Administered 2011-12-03: 25 mL via INTRAVENOUS
  Administered 2011-12-03: 2.7 mg via INTRAVENOUS
  Administered 2011-12-04: 1.3 mg via INTRAVENOUS
  Administered 2011-12-04: 3.9 mg via INTRAVENOUS
  Administered 2011-12-04: 0.6 mg via INTRAVENOUS
  Administered 2011-12-04: 14:00:00 via INTRAVENOUS
  Administered 2011-12-04: 1.8 mg via INTRAVENOUS
  Administered 2011-12-04: 0.9 mg via INTRAVENOUS
  Administered 2011-12-05: 0.3 mg via INTRAVENOUS
  Filled 2011-12-02 (×4): qty 25

## 2011-12-02 MED ORDER — GLYCOPYRROLATE 0.2 MG/ML IJ SOLN
INTRAMUSCULAR | Status: DC | PRN
  Administered 2011-12-02: .7 mg via INTRAVENOUS

## 2011-12-02 MED ORDER — METOPROLOL SUCCINATE ER 50 MG PO TB24
50.0000 mg | ORAL_TABLET | Freq: Every day | ORAL | Status: DC
  Administered 2011-12-03 – 2011-12-06 (×4): 50 mg via ORAL
  Filled 2011-12-02 (×4): qty 1

## 2011-12-02 MED ORDER — CHLORHEXIDINE GLUCONATE CLOTH 2 % EX PADS
6.0000 | MEDICATED_PAD | Freq: Every day | CUTANEOUS | Status: AC
  Administered 2011-12-02 – 2011-12-06 (×5): 6 via TOPICAL

## 2011-12-02 MED ORDER — MUPIROCIN 2 % EX OINT
1.0000 "application " | TOPICAL_OINTMENT | Freq: Two times a day (BID) | CUTANEOUS | Status: DC
  Administered 2011-12-02 – 2011-12-06 (×8): 1 via NASAL
  Filled 2011-12-02: qty 22

## 2011-12-02 MED ORDER — DIPHENHYDRAMINE HCL 12.5 MG/5ML PO ELIX
12.5000 mg | ORAL_SOLUTION | Freq: Four times a day (QID) | ORAL | Status: DC | PRN
  Filled 2011-12-02: qty 5

## 2011-12-02 SURGICAL SUPPLY — 66 items
BLADE SURG 15 STRL LF DISP TIS (BLADE) ×2 IMPLANT
BLADE SURG 15 STRL SS (BLADE) ×1
BLADE SURG ROTATE 9660 (MISCELLANEOUS) IMPLANT
CANISTER SUCTION 2500CC (MISCELLANEOUS) ×3 IMPLANT
CHLORAPREP W/TINT 10.5 ML (MISCELLANEOUS) ×3 IMPLANT
CLOTH BEACON ORANGE TIMEOUT ST (SAFETY) ×3 IMPLANT
COVER SURGICAL LIGHT HANDLE (MISCELLANEOUS) ×3 IMPLANT
DECANTER SPIKE VIAL GLASS SM (MISCELLANEOUS) ×3 IMPLANT
DERMABOND ADHESIVE PROPEN (GAUZE/BANDAGES/DRESSINGS) ×1
DERMABOND ADVANCED (GAUZE/BANDAGES/DRESSINGS)
DERMABOND ADVANCED .7 DNX12 (GAUZE/BANDAGES/DRESSINGS) IMPLANT
DERMABOND ADVANCED .7 DNX6 (GAUZE/BANDAGES/DRESSINGS) ×2 IMPLANT
DRAIN CHANNEL 19F RND (DRAIN) ×3 IMPLANT
DRAPE LAPAROSCOPIC ABDOMINAL (DRAPES) ×3 IMPLANT
DRAPE PED LAPAROTOMY (DRAPES) ×3 IMPLANT
DRAPE PROXIMA HALF (DRAPES) ×3 IMPLANT
DRAPE UTILITY 15X26 W/TAPE STR (DRAPE) ×6 IMPLANT
DRAPE WARM FLUID 44X44 (DRAPE) ×3 IMPLANT
ELECT BLADE 6.5 EXT (BLADE) IMPLANT
ELECT CAUTERY BLADE 6.4 (BLADE) ×3 IMPLANT
ELECT REM PT RETURN 9FT ADLT (ELECTROSURGICAL) ×3
ELECTRODE REM PT RTRN 9FT ADLT (ELECTROSURGICAL) ×2 IMPLANT
EVACUATOR SILICONE 100CC (DRAIN) ×3 IMPLANT
GAUZE SPONGE 4X4 16PLY XRAY LF (GAUZE/BANDAGES/DRESSINGS) IMPLANT
GLOVE BIO SURGEON STRL SZ8 (GLOVE) ×15 IMPLANT
GLOVE BIOGEL PI IND STRL 8 (GLOVE) ×8 IMPLANT
GLOVE BIOGEL PI INDICATOR 8 (GLOVE) ×4
GLOVE SURG SS PI 8.0 STRL IVOR (GLOVE) ×3 IMPLANT
GOWN PREVENTION PLUS XLARGE (GOWN DISPOSABLE) ×6 IMPLANT
GOWN STRL NON-REIN LRG LVL3 (GOWN DISPOSABLE) ×12 IMPLANT
KIT BASIN OR (CUSTOM PROCEDURE TRAY) ×3 IMPLANT
KIT ROOM TURNOVER OR (KITS) ×3 IMPLANT
LEGGING LITHOTOMY PAIR STRL (DRAPES) ×3 IMPLANT
NEEDLE HYPO 25GX1X1/2 BEV (NEEDLE) ×3 IMPLANT
NS IRRIG 1000ML POUR BTL (IV SOLUTION) ×6 IMPLANT
PACK GENERAL/GYN (CUSTOM PROCEDURE TRAY) ×3 IMPLANT
PACK SURGICAL SETUP 50X90 (CUSTOM PROCEDURE TRAY) ×3 IMPLANT
PAD ARMBOARD 7.5X6 YLW CONV (MISCELLANEOUS) ×6 IMPLANT
PENCIL BUTTON HOLSTER BLD 10FT (ELECTRODE) ×3 IMPLANT
SPECIMEN JAR LARGE (MISCELLANEOUS) ×3 IMPLANT
SPONGE GAUZE 4X4 12PLY (GAUZE/BANDAGES/DRESSINGS) ×3 IMPLANT
SPONGE LAP 18X18 X RAY DECT (DISPOSABLE) ×3 IMPLANT
STAPLER CIRC CVD 29MM 37CM (STAPLE) ×3 IMPLANT
STAPLER VISISTAT 35W (STAPLE) ×3 IMPLANT
SUCTION POOLE TIP (SUCTIONS) ×3 IMPLANT
SUT ETHILON 2 0 FS 18 (SUTURE) ×12 IMPLANT
SUT MNCRL AB 4-0 PS2 18 (SUTURE) ×3 IMPLANT
SUT PDS AB 1 CT  36 (SUTURE) ×4
SUT PDS AB 1 CT 36 (SUTURE) ×8 IMPLANT
SUT PDS AB 1 TP1 96 (SUTURE) ×9 IMPLANT
SUT PROLENE 2 0 KS (SUTURE) ×3 IMPLANT
SUT SILK 3 0 TIES 10X30 (SUTURE) ×3 IMPLANT
SUT VIC AB 2-0 SH 18 (SUTURE) ×3 IMPLANT
SUT VIC AB 3-0 SH 18 (SUTURE) ×9 IMPLANT
SUT VIC AB 3-0 SH 27 (SUTURE) ×1
SUT VIC AB 3-0 SH 27X BRD (SUTURE) ×2 IMPLANT
SUT VICRYL AB 2 0 TIES (SUTURE) ×3 IMPLANT
SUT VICRYL AB 3 0 TIES (SUTURE) ×3 IMPLANT
SYR CONTROL 10ML LL (SYRINGE) ×3 IMPLANT
TAPE CLOTH SURG 4X10 WHT LF (GAUZE/BANDAGES/DRESSINGS) ×3 IMPLANT
TAPE CLOTH SURG 6X10 WHT LF (GAUZE/BANDAGES/DRESSINGS) ×3 IMPLANT
TOWEL OR 17X24 6PK STRL BLUE (TOWEL DISPOSABLE) ×3 IMPLANT
TOWEL OR 17X26 10 PK STRL BLUE (TOWEL DISPOSABLE) ×3 IMPLANT
TRAY FOLEY CATH 14FRSI W/METER (CATHETERS) ×3 IMPLANT
WATER STERILE IRR 1000ML POUR (IV SOLUTION) IMPLANT
YANKAUER SUCT BULB TIP NO VENT (SUCTIONS) ×3 IMPLANT

## 2011-12-02 NOTE — Anesthesia Procedure Notes (Signed)
Procedure Name: Intubation Date/Time: 12/02/2011 8:47 AM Performed by: Rossie Muskrat L Pre-anesthesia Checklist: Patient identified, Patient being monitored, Timeout performed, Emergency Drugs available and Suction available Patient Re-evaluated:Patient Re-evaluated prior to inductionOxygen Delivery Method: Circle system utilized Preoxygenation: Pre-oxygenation with 100% oxygen Intubation Type: IV induction Ventilation: Mask ventilation without difficulty Laryngoscope Size: Miller and 2 Grade View: Grade I Tube type: Oral Tube size: 7.5 mm Number of attempts: 1 Airway Equipment and Method: Stylet Placement Confirmation: breath sounds checked- equal and bilateral,  positive ETCO2 and ETT inserted through vocal cords under direct vision Secured at: 21 cm Tube secured with: Tape Dental Injury: Teeth and Oropharynx as per pre-operative assessment

## 2011-12-02 NOTE — Interval H&P Note (Signed)
History and Physical Interval Note:  12/02/2011 7:40 AM  Patrick Woodward  has presented today for surgery, with the diagnosis of History colon cancer  The various methods of treatment have been discussed with the patient and family. After consideration of risks, benefits and other options for treatment, the patient has consented to  Procedure(s) (LRB) with comments: COLOSTOMY CLOSURE (N/A) REMOVAL PORT-A-CATH (N/A) as a surgical intervention .  The patient's history has been reviewed, patient examined, no change in status, stable for surgery.  I have reviewed the patient's chart and labs.  Questions were answered to the patient's satisfaction.     Randy Whitener A.

## 2011-12-02 NOTE — Transfer of Care (Signed)
Immediate Anesthesia Transfer of Care Note  Patient: Patrick Woodward  Procedure(s) Performed: Procedure(s) (LRB) with comments: COLOSTOMY CLOSURE (N/A) REMOVAL PORT-A-CATH (Left)  Patient Location: PACU  Anesthesia Type: General  Level of Consciousness: awake, alert  and oriented  Airway & Oxygen Therapy: Patient Spontanous Breathing and Patient connected to nasal cannula oxygen  Post-op Assessment: Report given to PACU RN, Post -op Vital signs reviewed and stable and Patient moving all extremities  Post vital signs: Reviewed and stable  Complications: No apparent anesthesia complications

## 2011-12-02 NOTE — Op Note (Signed)
Preoperative diagnosis: History of colon cancer with and colostomy  Postoperative diagnosis: Same  Procedure: Closure of colostomy and removal Port-A-Cath and rigid sigmoidoscopy  Surgeon: Harriette Bouillon M.D.  Assistant: Loretha Brasil M.D.  Anesthesia: Gen. Endotracheal anesthesia  EBL: 50 cc  Specimen: Colostomy and anastomotic donuts  Drain: 19 French round to pelvis  IV fluids: 3000 cc crystalloid  Indications for procedure: The patient presents for closure of his colostomy. He underwent emergent sigmoid colectomy for obstructing colon cancer of the sigmoid colon one year ago. He has a Port-A-Cath in place as well and once this removed. Risks, benefits and alternative therapies discussed. Risk of bleeding, infection, anastomotic leak, sepsis, colostomy, bladder injury, injury to ureter, DVT, wound infection, hernia, death, stroke, myocardial infarction, and the need for other procedures discussed. Risk of for removal include bleeding, infection, fragmentation catheter, air embolus, and the need for further procedures. Patient agreed to proceed.  Description of procedure: The patient was met in the holding area and questions were answered. He is taken back to the operating room and placed supine on the operating room table. After induction of general endotracheal esthesia, left upper chest was prepped and draped in a sterile fashion. Timeout was done he received appropriate preoperative antibiotics. Incision was made over the Port-A-Cath dissection was carried down and the hub was identified. The port and catheter were removed. The catheter was removed in its entirety. Incision closed with 3-0 Vicryl. Dermabond applied.  The colostomy was closed with pursestring suture of 2-0 Vicryl. Foley catheter placed under sterile conditions. Patient placed in lithotomy. Abdomen and pelvis prepped and draped in sterile fashion. Midline incision was used to be old scar and dissection was carried into  the abdominal cavity. Small bowel adhesions were noted to the anterior abdominal wall and these are taken down with sharp dissection. The colostomy was taken down with cautery and brought back into the abdominal cavity with minimal spillage. Rectal stump was identified and distal sigmoid colon was identified scar to the left lateral pelvic wall and this was mobilized with sharp dissection without injury. There is ample length of the proximal: To reach the distal colon. We elected use an EEA stapler for anastomosis. The proximal colon was prepared by placing a pursestring suture of 2-0 Prolene. The into the colon was excised distal to the suture. The EEA sizers were used and 29 mm was chosen. The anvil was placed and sutured tied down to secure it. I went below and passed the sizers transanally for dilation. Stapler was passed transanally until he reached the end of the stump. There is some mild serosal tear on anterior surface of stump. This was 2 cm in length. We deployed the spike through the serosal tear. We then closed the stapling device without twisting the colon. It was fired. The serosal tissue was oversewn with 3-0 Vicryl. The anastomosis was tested using a rigid sigmoidoscopy with insufflation of air without evidence of leakage from the anastomosis with saline in the pelvis and adequate outpatient of the colon proximal to the anastomosis. There is no evidence of bleeding and the rigid sigmoidoscope was removed. Irrigation was used and suctioned out of the pelvis. NG tube was positioned in the stomach. Colostomy site closed in 2 layers with #1 PDS. Drain placed and pelvis through right lower quadrant stab incision and secured with 2-0 nylon. Fascia closed with double-stranded #1 PDS. Skin closed staples. 2 interrupted 2-0 nylon sutures were used to approximate the skin at the colostomy site and  this was packed open with saline soaked Kerlix. All final counts sponge, needles and instruments are found to be  correct. Patient awoke, extubated and taken to recovery in satisfactory condition.

## 2011-12-02 NOTE — Anesthesia Preprocedure Evaluation (Addendum)
Anesthesia Evaluation  Patient identified by MRN, date of birth, ID band Patient awake    Reviewed: Allergy & Precautions, H&P , NPO status , Patient's Chart, lab work & pertinent test results, reviewed documented beta blocker date and time   Airway Mallampati: II      Dental  (+) Edentulous Upper, Partial Lower and Dental Advidsory Given   Pulmonary  breath sounds clear to auscultation        Cardiovascular hypertension, Pt. on medications and Pt. on home beta blockers Rhythm:Regular Rate:Normal     Neuro/Psych    GI/Hepatic GERD-  Medicated and Controlled,  Endo/Other    Renal/GU      Musculoskeletal   Abdominal   Peds  Hematology   Anesthesia Other Findings   Reproductive/Obstetrics                         Anesthesia Physical Anesthesia Plan  ASA: III  Anesthesia Plan: General   Post-op Pain Management:    Induction: Intravenous  Airway Management Planned: Oral ETT  Additional Equipment:   Intra-op Plan:   Post-operative Plan: Extubation in OR  Informed Consent: I have reviewed the patients History and Physical, chart, labs and discussed the procedure including the risks, benefits and alternatives for the proposed anesthesia with the patient or authorized representative who has indicated his/her understanding and acceptance.   Dental advisory given  Plan Discussed with: CRNA and Surgeon  Anesthesia Plan Comments: (H/O Colon Ca S/P chemotherapy  Htn Renal insuff Cr. 1.41 GERD  Plan GA with oral ETT)       Anesthesia Quick Evaluation

## 2011-12-02 NOTE — Preoperative (Signed)
Beta Blockers   Reason not to administer Beta Blockers:B blocker taken 12/02/11 @ 0500

## 2011-12-03 ENCOUNTER — Encounter (HOSPITAL_COMMUNITY): Payer: Self-pay | Admitting: Surgery

## 2011-12-03 LAB — BASIC METABOLIC PANEL
CO2: 26 mEq/L (ref 19–32)
Glucose, Bld: 128 mg/dL — ABNORMAL HIGH (ref 70–99)
Potassium: 4.3 mEq/L (ref 3.5–5.1)
Sodium: 139 mEq/L (ref 135–145)

## 2011-12-03 LAB — CBC
Hemoglobin: 12.7 g/dL — ABNORMAL LOW (ref 13.0–17.0)
MCH: 27.6 pg (ref 26.0–34.0)
MCV: 83.7 fL (ref 78.0–100.0)
RBC: 4.6 MIL/uL (ref 4.22–5.81)

## 2011-12-03 MED ORDER — ACETAMINOPHEN 10 MG/ML IV SOLN
1000.0000 mg | Freq: Once | INTRAVENOUS | Status: AC | PRN
  Filled 2011-12-03: qty 100

## 2011-12-03 NOTE — Progress Notes (Signed)
DC'd foley catheter at this time per order.

## 2011-12-03 NOTE — Progress Notes (Signed)
Patient accidentally pulled NGT out.Will notify MD. me

## 2011-12-03 NOTE — Care Management Note (Signed)
  Page 2 of 2   12/04/2011     11:35:15 AM   CARE MANAGEMENT NOTE 12/04/2011  Patient:  Patrick Woodward, Patrick Woodward   Account Number:  [id number]  Date Initiated:  12/03/2011  Documentation initiated by:  Ronny Flurry  Subjective/Objective Assessment:   COLOSTOMY CLOSURE (N/A)  REMOVAL PORT-A-CATH     Action/Plan:   Anticipated DC Date:  12/05/2011   Anticipated DC Plan:  HOME/SELF CARE         Choice offered to / List presented to:             Status of service:   Medicare Important Message given?   (If response is "NO", the following Medicare IM given date fields will be blank) Date Medicare IM given:   Date Additional Medicare IM given:    Discharge Disposition:    Per UR Regulation:  Reviewed for med. necessity/level of care/duration of stay  If discussed at Long Length of Stay Meetings, dates discussed:    Comments:   12-04-11 Medication assistance  Met with patient and his wife .  Patient does have prescription coverage , however, co pays are " too high" .  Vytorin , spoke with rep on phone , patient can apply for assistance even with prescription coverage, he will receive a denial letter and another form at first . He will have to complete second form and send into [employer].   [place] and me  Nexium and Toptol XL  Spoke with [place] and [place] rep and patient is also eligible to apply for assistance    Gave patient and his wife forms to complete. MD prescripting these medications must also sign forms . Patient needs to mail completed forms and prescriptions to [facility] and me and [employer] with proof of income .  Explained above to patient and his wife , both voiced understanding .  Patient stated he had been through progress before for Vytorin ( 2012 ) and did receive assistance .   Ronny Flurry RN BSN [phone]

## 2011-12-03 NOTE — Progress Notes (Signed)
Dr.Wyatt aware of NGT being out.Order received okay to leave NGT out. me

## 2011-12-03 NOTE — Progress Notes (Signed)
1 Day Post-Op  Subjective: INCISIONAL PAIN  Objective: Vital signs in last 24 hours: Temp:  [97.6 F (36.4 C)-98.6 F (37 C)] 98.6 F (37 C) (10/24 0624) Pulse Rate:  [67-90] 71  (10/24 0624) Resp:  [8-88] 18  (10/24 0624) BP: (109-165)/(55-95) 109/57 mmHg (10/24 0624) SpO2:  [94 %-100 %] 97 % (10/24 0624) Weight:  [200 lb 12.8 oz (91.082 kg)] 200 lb 12.8 oz (91.082 kg) (10/24 0211) Last BM Date: 12/01/11  Intake/Output from previous day: 10/23 0701 - 10/24 0700 In: 3020 [I.V.:3020] Out: 1040 [Urine:750; Drains:90; Blood:200] Intake/Output this shift: Total I/O In: 1120 [I.V.:1120] Out: 450 [Urine:450]  Incision/Wound:DRESSINGS WITH SEROUS DRAINAGE SOFT ABDOMEN QUIET  jp SEROUS TO BLOODY 90 CC  Lab Results:   [facility] 12/03/11 0613 12/02/11 1450  WBC 12.5* 12.4*  HGB 12.7* 12.9*  HCT 38.5* 39.0  PLT 137* 138*   BMET  Basename 12/02/11 1450 11/30/11 1023  NA -- 138  K -- 4.4  CL -- 102  CO2 -- 27  GLUCOSE -- 88  BUN -- 12  CREATININE 1.25 1.41*  CALCIUM -- 9.3   PT/INR No results found for this basename: LABPROT:2,INR:2 in the last 72 hours ABG No results found for this basename: PHART:2,PCO2:2,PO2:2,HCO3:2 in the last 72 hours  Studies/Results: No results found.  Anti-infectives: Anti-infectives     Start     Dose/Rate Route Frequency Ordered Stop   12/02/11 0900   ertapenem (INVANZ) 1 g in sodium chloride 0.9 % 50 mL IVPB  Status:  Discontinued        1 g 100 mL/hr over 30 Minutes Intravenous Every 24 hours 12/02/11 0808 12/02/11 1445   12/02/11 0600   ceFAZolin (ANCEF) IVPB 2 g/50 mL premix  Status:  Discontinued        2 g 100 mL/hr over 30 Minutes Intravenous On call to O.R. 12/01/11 1447 12/02/11 0808          Assessment/Plan: s/p Procedure(s) (LRB) with comments: COLOSTOMY CLOSURE (N/A) REMOVAL PORT-A-CATH (Left) OOB D/C NGT D/C FOLEY ICE CHIPS WOUND CARE ADD IV TYLENOL FOR 24 HOURS FOR BETTER PAIN CONTROL  LOS: 1 day     Patrick Woodward A. 12/03/2011

## 2011-12-04 NOTE — Progress Notes (Signed)
Patient had small bowel movement this evening and while wiping himself  noticed small amount blood on tissue.Dr. Lindie Spruce notified no new orders received. me

## 2011-12-04 NOTE — Anesthesia Postprocedure Evaluation (Signed)
  Anesthesia Post-op Note  Patient: Patrick Woodward  Procedure(s) Performed: Procedure(s) (LRB) with comments: COLOSTOMY CLOSURE (N/A) REMOVAL PORT-A-CATH (Left)  Patient Location: PACU  Anesthesia Type: General  Level of Consciousness: awake, alert  and oriented  Airway and Oxygen Therapy: Patient Spontanous Breathing and Patient connected to nasal cannula oxygen  Post-op Pain: mild  Post-op Assessment: Post-op Vital signs reviewed and Patient's Cardiovascular Status Stable  Post-op Vital Signs: stable  Complications: No apparent anesthesia complications

## 2011-12-04 NOTE — Progress Notes (Signed)
2 Days Post-Op  Subjective: Passing gas 1 BM recorded  Objective: Vital signs in last 24 hours: Temp:  [98.2 F (36.8 C)-99.3 F (37.4 C)] 98.2 F (36.8 C) (10/25 0526) Pulse Rate:  [70-104] 75  (10/25 0526) Resp:  [10-19] 11  (10/25 0809) BP: (112-155)/(65-82) 140/67 mmHg (10/25 0526) SpO2:  [95 %-100 %] 100 % (10/25 0809) Last BM Date: 12/01/11  Intake/Output from previous day: 10/24 0701 - 10/25 0700 In: 2196.7 [P.O.:180; I.V.:2016.7] Out: 346 [Urine:225; Drains:120; Stool:1] Intake/Output this shift:    Incision/Wound:dressings clean non distended abdomen  Lab Results:   [facility] 12/03/11 0613 12/02/11 1450  WBC 12.5* 12.4*  HGB 12.7* 12.9*  HCT 38.5* 39.0  PLT 137* 138*   BMET  Basename 12/03/11 0613 12/02/11 1450  NA 139 --  K 4.3 --  CL 103 --  CO2 26 --  GLUCOSE 128* --  BUN 13 --  CREATININE 1.21 1.25  CALCIUM 8.4 --   PT/INR No results found for this basename: LABPROT:2,INR:2 in the last 72 hours ABG No results found for this basename: PHART:2,PCO2:2,PO2:2,HCO3:2 in the last 72 hours  Studies/Results: No results found.  Anti-infectives: Anti-infectives     Start     Dose/Rate Route Frequency Ordered Stop   12/02/11 0900   ertapenem (INVANZ) 1 g in sodium chloride 0.9 % 50 mL IVPB  Status:  Discontinued        1 g 100 mL/hr over 30 Minutes Intravenous Every 24 hours 12/02/11 0808 12/02/11 1445   12/02/11 0600   ceFAZolin (ANCEF) IVPB 2 g/50 mL premix  Status:  Discontinued        2 g 100 mL/hr over 30 Minutes Intravenous On call to O.R. 12/01/11 1447 12/02/11 0808          Assessment/Plan: s/p Procedure(s) (LRB) with comments: COLOSTOMY CLOSURE (N/A) REMOVAL PORT-A-CATH (Left) Advance diet Decrease IVF Keep PCA 1 more day.  LOS: 2 days    Patrick Woodward A. 12/04/2011

## 2011-12-05 LAB — COMPREHENSIVE METABOLIC PANEL
Albumin: 2.9 g/dL — ABNORMAL LOW (ref 3.5–5.2)
BUN: 10 mg/dL (ref 6–23)
Chloride: 106 mEq/L (ref 96–112)
Creatinine, Ser: 1.21 mg/dL (ref 0.50–1.35)
GFR calc Af Amer: 69 mL/min — ABNORMAL LOW (ref >90)
Glucose, Bld: 96 mg/dL (ref 70–99)
Total Bilirubin: 0.9 mg/dL (ref 0.3–1.2)

## 2011-12-05 LAB — CBC
HCT: 35.4 % — ABNORMAL LOW (ref 39.0–52.0)
Hemoglobin: 11.4 g/dL — ABNORMAL LOW (ref 13.0–17.0)
MCHC: 32.2 g/dL (ref 30.0–36.0)
MCV: 84.5 fL (ref 78.0–100.0)
RDW: 16.4 % — ABNORMAL HIGH (ref 11.5–15.5)
WBC: 6.2 10*3/uL (ref 4.0–10.5)

## 2011-12-05 MED ORDER — OXYCODONE-ACETAMINOPHEN 5-325 MG PO TABS
2.0000 | ORAL_TABLET | ORAL | Status: DC | PRN
  Administered 2011-12-05 – 2011-12-06 (×3): 2 via ORAL
  Filled 2011-12-05 (×3): qty 2

## 2011-12-05 MED ORDER — HYDROMORPHONE HCL PF 1 MG/ML IJ SOLN: 1.0000 mg | INTRAMUSCULAR | Status: DC | PRN

## 2011-12-05 NOTE — Progress Notes (Signed)
3 Days Post-Op  Subjective: Feels ok.  Having flatus and BM  Objective: Vital signs in last 24 hours: Temp:  [97.3 F (36.3 C)-98.9 F (37.2 C)] 97.3 F (36.3 C) (10/26 0543) Pulse Rate:  [63-72] 72  (10/26 0543) Resp:  [8-20] 20  (10/26 0543) BP: (114-129)/(61-84) 114/65 mmHg (10/26 0543) SpO2:  [96 %-100 %] 100 % (10/26 0543) Last BM Date: 12/03/11  Intake/Output from previous day: 10/25 0701 - 10/26 0700 In: 2039.2 [P.O.:840; I.V.:1199.2] Out: 31 [Drains:31] Intake/Output this shift:    Incision/Wound:CLEAN AND INTACT.  COLOSTOMY SITE OPEN SOFT NON DISTENDED  Lab Results:   Basename 12/05/11 0630 12/03/11 0613  WBC 6.2 12.5*  HGB 11.4* 12.7*  HCT 35.4* 38.5*  PLT 133* 137*   BMET  Basename 12/03/11 0613 12/02/11 1450  NA 139 --  K 4.3 --  CL 103 --  CO2 26 --  GLUCOSE 128* --  BUN 13 --  CREATININE 1.21 1.25  CALCIUM 8.4 --   PT/INR No results found for this basename: LABPROT:2,INR:2 in the last 72 hours ABG No results found for this basename: PHART:2,PCO2:2,PO2:2,HCO3:2 in the last 72 hours  Studies/Results: No results found.  Anti-infectives: Anti-infectives     Start     Dose/Rate Route Frequency Ordered Stop   12/02/11 0900   ertapenem (INVANZ) 1 g in sodium chloride 0.9 % 50 mL IVPB  Status:  Discontinued        1 g 100 mL/hr over 30 Minutes Intravenous Every 24 hours 12/02/11 0808 12/02/11 1445   12/02/11 0600   ceFAZolin (ANCEF) IVPB 2 g/50 mL premix  Status:  Discontinued        2 g 100 mL/hr over 30 Minutes Intravenous On call to O.R. 12/01/11 1447 12/02/11 0808          Assessment/Plan: s/p Procedure(s) (LRB) with comments: COLOSTOMY CLOSURE (N/A) REMOVAL PORT-A-CATH (Left) Advance diet  LOS: 3 days    Patrick Bayer A. 12/05/2011

## 2011-12-06 MED ORDER — POLYETHYLENE GLYCOL 3350 17 G PO PACK
17.0000 g | PACK | Freq: Every day | ORAL | Status: DC
  Administered 2011-12-06: 17 g via ORAL
  Filled 2011-12-06 (×2): qty 1

## 2011-12-06 MED ORDER — OXYCODONE-ACETAMINOPHEN 5-325 MG PO TABS: 2.0000 | ORAL_TABLET | ORAL | Status: DC | PRN

## 2011-12-06 MED ORDER — OXYCODONE-ACETAMINOPHEN 5-325 MG PO TABS: 1.0000 | ORAL_TABLET | ORAL | Status: DC | PRN

## 2011-12-06 MED ORDER — POLYETHYLENE GLYCOL 3350 17 G PO PACK: 17.0000 g | PACK | Freq: Every day | ORAL | Status: DC

## 2011-12-06 NOTE — Progress Notes (Signed)
   CARE MANAGEMENT NOTE 12/06/2011  Patient:  LETROY, VAZGUEZ   Account Number:  [id number]  Date Initiated:  12/03/2011  Documentation initiated by:  Ronny Flurry  Subjective/Objective Assessment:   COLOSTOMY CLOSURE (N/A)  REMOVAL PORT-A-CATH     Action/Plan:   Anticipated DC Date:  12/05/2011   Anticipated DC Plan:  HOME/SELF CARE      DC Planning Services  CM consult      [facility] Choice  HOME HEALTH   Choice offered to / List presented to:  C-1 Patient        HH arranged  HH-1 RN      [facility] agency  [facility]   Status of service:  Completed, signed off Medicare Important Message given?   (If response is "NO", the following Medicare IM given date fields will be blank) Date Medicare IM given:   Date Additional Medicare IM given:    Discharge Disposition:  HOME W HOME HEALTH SERVICES  Per UR Regulation:  Reviewed for med. necessity/level of care/duration of stay  If discussed at Long Length of Stay Meetings, dates discussed:    Comments:  12/06/2011 1000 NCM spoke to pt and he had [facility] in the past. Requested their services again for [facility].  Made Unit RN aware to add [facility] contact info to pt's d/c instructions. Pt states no DME needed at home. NCM faxed to [facility] and spoke t to rep. They will do soc on 10/28 for dressing changes. Unit RN did his dressing today. Isidoro Donning RN CCM Case Mgmt phone [phone]  12-04-11 Medication assistance  Met with patient and his wife .  Patient does have prescription coverage , however, co pays are " too high" .  Vytorin , spoke with rep on phone , patient can apply for assistance even with prescription coverage, he will receive a denial letter and another form at first . He will have to complete second form and send into [employer].   [place] [employer] me  Nexium and Toptol XL  Spoke with [place] [employer] [place] rep and patient is also eligible to apply for assistance    Gave patient and his wife forms to complete. MD prescripting these  medications must also sign forms . Patient needs to mail completed forms and prescriptions to [facility] and me and [employer] with proof of income .  Explained above to patient and his wife , both voiced understanding .  Patient stated he had been through progress before for Vytorin ( 2012 ) and did receive assistance .   Ronny Flurry RN BSN [phone]

## 2011-12-06 NOTE — Discharge Summary (Signed)
Physician Discharge Summary  Patient ID: DEMPSY Woodward MRN: [id number] DOB/AGE: 09-05-1940 71 y.o.  Admit date: 12/02/2011 Discharge date: 12/06/2011  Admission Diagnoses: colon cancer history and colostomy  Discharge Diagnoses: same Active Problems:  * No active hospital problems. *   Patient Active Problem List  Diagnosis  . Colon cancer    Discharged Condition: good  Hospital Course: Unremarkable.  Bowel function returned on day 2.  Tolerating diet .  Good pain control.  Wounds clean and JP clear.  Consults: None  Significant Diagnostic Studies: labs:  CBC    Component Value Date/Time   WBC 6.2 12/05/2011 0630   WBC 4.6 09/14/2011 0951   RBC 4.19* 12/05/2011 0630   RBC 4.82 09/14/2011 0951   HGB 11.4* 12/05/2011 0630   HGB 13.3 09/14/2011 0951   HCT 35.4* 12/05/2011 0630   HCT 40.0 09/14/2011 0951   PLT 133* 12/05/2011 0630   PLT 159 09/14/2011 0951   MCV 84.5 12/05/2011 0630   MCV 83.0 09/14/2011 0951   MCH 27.2 12/05/2011 0630   MCH 27.6 09/14/2011 0951   MCHC 32.2 12/05/2011 0630   MCHC 33.3 09/14/2011 0951   RDW 16.4* 12/05/2011 0630   RDW 14.9* 09/14/2011 0951   LYMPHSABS 1.8 11/30/2011 1023   LYMPHSABS 1.4 09/14/2011 0951   MONOABS 0.6 11/30/2011 1023   MONOABS 0.6 09/14/2011 0951   EOSABS 0.1 11/30/2011 1023   EOSABS 0.1 09/14/2011 0951   BASOSABS 0.0 11/30/2011 1023   BASOSABS 0.0 09/14/2011 0951     Treatments: surgery: colostomy closure port removal  Discharge Exam: Blood pressure 120/65, pulse 61, temperature 98.5 F (36.9 C), temperature source Oral, resp. rate 18, height 6\' 5"  (1.956 m), weight 200 lb 12.8 oz (91.082 kg), SpO2 95.00%. Lungs clear CV  RRR Abdomen wounds clean.  Disposition: 01-Home or Self Care  Discharge Orders    Future Appointments: Provider: Department: Dept Phone: Center:   12/11/2011 10:30 AM Patrick Fus A. Shaquavia Whisonant, MD [facility] [phone] None   01/19/2012 8:30 AM Patrick Artist, MD [facility] [phone] None     Future Orders Please Complete By Expires   Increase activity slowly      Discharge instructions      Comments:   Soft diet.  Take miralax daily       Medication List     As of 12/06/2011  7:46 AM    TAKE these medications         aspirin 325 MG EC tablet   Take 325 mg by mouth daily.      lidocaine-prilocaine cream   Commonly known as: EMLA   Apply 1 application topically as needed.      metoprolol succinate 50 MG 24 hr tablet   Commonly known as: TOPROL-XL   Take 50 mg by mouth daily.      MULTIVITAMIN PO   Take 1 tablet by mouth daily.      NEXIUM 40 MG capsule   Generic drug: esomeprazole   Take 40 mg by mouth daily.      oxyCODONE-acetaminophen 5-325 MG per tablet   Commonly known as: PERCOCET/ROXICET   Take 2 tablets by mouth every 4 (four) hours as needed.      polyethylene glycol packet   Commonly known as: MIRALAX / GLYCOLAX   Take 17 g by mouth daily.      vitamin E 400 UNIT capsule   Generic drug: vitamin E   Take 400 Units by mouth daily.  VYTORIN 10-20 MG per tablet   Generic drug: ezetimibe-simvastatin   Take 1 tablet by mouth at bedtime.         Signed: Kaceton Woodward A. 12/06/2011, 7:46 AM

## 2011-12-06 NOTE — Progress Notes (Signed)
DC HOME WITH WIFE, VERBALLY UNDERSTOOD DC INSTRUCTIONS, NO QUESTIONS ASKED 

## 2011-12-06 NOTE — Progress Notes (Signed)
4 Days Post-Op  Subjective: Doing ok.  Bowels moving.  Tolerating diet.  Objective: Vital signs in last 24 hours: Temp:  [98.5 F (36.9 C)-98.9 F (37.2 C)] 98.5 F (36.9 C) (10/27 0548) Pulse Rate:  [61-87] 61  (10/27 0548) Resp:  [18] 18  (10/27 0548) BP: (107-120)/(65-70) 120/65 mmHg (10/27 0548) SpO2:  [95 %-99 %] 95 % (10/27 0548) Last BM Date: 12/03/11  Intake/Output from previous day: 10/26 0701 - 10/27 0700 In: 1265 [P.O.:840; I.V.:425] Out: 25 [Drains:25] Intake/Output this shift:    Resp: clear to auscultation bilaterally Cardio: regular rate and rhythm, S1, S2 normal, no murmur, click, rub or gallop Incision/Wound:clean intact.  JP serous  Lab Results:   Basename 12/05/11 0630  WBC 6.2  HGB 11.4*  HCT 35.4*  PLT 133*   BMET  Basename 12/05/11 0630  NA 139  K 3.9  CL 106  CO2 27  GLUCOSE 96  BUN 10  CREATININE 1.21  CALCIUM 8.4   PT/INR No results found for this basename: LABPROT:2,INR:2 in the last 72 hours ABG No results found for this basename: PHART:2,PCO2:2,PO2:2,HCO3:2 in the last 72 hours  Studies/Results: No results found.  Anti-infectives: Anti-infectives     Start     Dose/Rate Route Frequency Ordered Stop   12/02/11 0900   ertapenem (INVANZ) 1 g in sodium chloride 0.9 % 50 mL IVPB  Status:  Discontinued        1 g 100 mL/hr over 30 Minutes Intravenous Every 24 hours 12/02/11 0808 12/02/11 1445   12/02/11 0600   ceFAZolin (ANCEF) IVPB 2 g/50 mL premix  Status:  Discontinued        2 g 100 mL/hr over 30 Minutes Intravenous On call to O.R. 12/01/11 1447 12/02/11 0808          Assessment/Plan: s/p Procedure(s) (LRB) with comments: COLOSTOMY CLOSURE (N/A) REMOVAL PORT-A-CATH (Left) Discharge  LOS: 4 days    Patrick Woodward A. 12/06/2011

## 2011-12-11 ENCOUNTER — Encounter (INDEPENDENT_AMBULATORY_CARE_PROVIDER_SITE_OTHER): Payer: Self-pay | Admitting: Surgery

## 2011-12-11 ENCOUNTER — Ambulatory Visit (INDEPENDENT_AMBULATORY_CARE_PROVIDER_SITE_OTHER): Payer: Medicare Other | Admitting: Surgery

## 2011-12-11 VITALS — BP 120/80 | HR 80 | Temp 97.7°F | Resp 16 | Ht 77.5 in | Wt 191.0 lb

## 2011-12-11 DIAGNOSIS — Z9889 Other specified postprocedural states: Secondary | ICD-10-CM

## 2011-12-11 NOTE — Patient Instructions (Signed)
Return 1 month.  Continue present care

## 2011-12-11 NOTE — Progress Notes (Signed)
Patient returns 10 days after closure of colostomy. He is doing well. I took his drain out today. His bowels are moving. Tolerating diet.  Exam: Midline incision him in well and staples removed and Steri-Strips. Old ostomy site sutures removed wound clean. Abdomen nondistended. JP drainage serous and removed.  Impression: Status post colostomy closure  Plan: Doing well return to clinic one month

## 2012-01-12 ENCOUNTER — Encounter (INDEPENDENT_AMBULATORY_CARE_PROVIDER_SITE_OTHER): Payer: Self-pay | Admitting: Surgery

## 2012-01-12 ENCOUNTER — Ambulatory Visit (INDEPENDENT_AMBULATORY_CARE_PROVIDER_SITE_OTHER): Payer: Medicare Other | Admitting: Surgery

## 2012-01-12 VITALS — BP 116/78 | HR 72 | Temp 98.5°F | Resp 14 | Ht 77.5 in | Wt 199.0 lb

## 2012-01-12 DIAGNOSIS — Z9889 Other specified postprocedural states: Secondary | ICD-10-CM

## 2012-01-12 NOTE — Progress Notes (Signed)
Patient returns after colostomy closure. He is now 5 weeks out. He is doing well.  Exam: Midline incision healed. Colostomy wound healed. There is some redundant skin in between the incision and the colostomy site but no evidence of hernia.  Impression: History of obstructing colon cancer status post colectomy and colostomy with recent reversal all colostomy doing well  Plan: Return in 6 months. Resume full activity as tolerated.

## 2012-01-12 NOTE — Patient Instructions (Signed)
Return 6 months

## 2012-01-19 ENCOUNTER — Telehealth: Payer: Self-pay | Admitting: Oncology

## 2012-01-19 ENCOUNTER — Ambulatory Visit (HOSPITAL_BASED_OUTPATIENT_CLINIC_OR_DEPARTMENT_OTHER): Payer: Medicare Other | Admitting: Oncology

## 2012-01-19 ENCOUNTER — Other Ambulatory Visit: Payer: Medicare Other | Admitting: Lab

## 2012-01-19 VITALS — BP 122/77 | HR 91 | Temp 97.6°F | Resp 18 | Ht 77.5 in | Wt 196.6 lb

## 2012-01-19 DIAGNOSIS — C189 Malignant neoplasm of colon, unspecified: Secondary | ICD-10-CM

## 2012-01-19 DIAGNOSIS — G589 Mononeuropathy, unspecified: Secondary | ICD-10-CM

## 2012-01-19 DIAGNOSIS — C187 Malignant neoplasm of sigmoid colon: Secondary | ICD-10-CM

## 2012-01-19 DIAGNOSIS — D702 Other drug-induced agranulocytosis: Secondary | ICD-10-CM

## 2012-01-19 DIAGNOSIS — C186 Malignant neoplasm of descending colon: Secondary | ICD-10-CM

## 2012-01-19 NOTE — Progress Notes (Signed)
   [facility]    OFFICE PROGRESS NOTE   INTERVAL HISTORY:   He returns as scheduled. He underwent colostomy reversal 12/02/2011. He reports good bowel function. He has occasional episodes of nausea. No emesis. The numbness at the fingers and feet has improved.  Objective:  Vital signs in last 24 hours:  Blood pressure 122/77, pulse 91, temperature 97.6 F (36.4 C), temperature source Oral, resp. rate 18, height 6' 5.5" (1.969 m), weight 196 lb 9.6 oz (89.177 kg).    HEENT: Neck without mass Lymphatics: No cervical, supraclavicular, axillary, or inguinal nodes Resp: Lungs clear bilaterally Cardio: Regular rate and rhythm GI: No hepatomegaly, nontender, no mass Vascular: No leg edema    Medications: I have reviewed the patient's current medications.  Assessment/Plan: 1. 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 Status post cycle #1 adjuvant FOLFOX chemotherapy 12/01/2010. He completed cycle 2 beginning 12/15/2010. He completed cycle 3 beginning 01/05/2011 with Neulasta support. He completed cycle 11 on 05/04/2011. He completed the 12th and final cycle on 05/18/2011. Negative colonoscopy 09/08/2011, benign polypoid lesion in the descending colon. CEA 1.3 on 09/14/2011. Restaging CT scans 10/16/2011 with no evidence of metastatic disease. 2. History of a bowel obstruction secondary to the primary colon mass. 3. History of abdominal pain/constipation secondary to the descending/sigmoid colon mass. 4. History of weight loss. Improved. 5. Status post Port-A-Cath placement 11/27/2010. Removed 12/02/2011. 6. History of neutropenia secondary to chemotherapy. Cycle 3 FOLFOX was held 12/29/2010 due to neutropenia. He received Neulasta with cycle #3 FOLFOX chemotherapy on 01/05/2011. He did not receive Neulasta following cycle 4. Cycle 5 was held for  one week due to neutropenia. He received Neulasta with subsequent treatments. Neulasta was held beginning with cycle 10 since the oxaliplatin was discontinued. 7. Thrombocytopenia secondary to chemotherapy. Oxaliplatin was dose reduced beginning with cycle 7. 8. Back pain following cycle 3 FOLFOX. Likely related to Neulasta. 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 Improved.  10. Delayed nausea following cycle 7 of FOLFOX. Aloxi was added to the antiemetic regimen beginning with cycle 8 FOLFOX. He also began prophylactic Decadron on days 2 and 3 following chemotherapy with cycle 8. He did not have nausea following cycle 8 or cycle 9 FOLFOX   Disposition:  Patrick Woodward remains in clinical remission from colon cancer. The intermittent mild nausea may be related to the recent surgery. He will contact us or Dr. Luisa Hart if the nausea becomes more consistent. He will return for a CEA in 2 months. He is scheduled for an office visit in 4 months.   Patrick Papas, MD  01/19/2012  12:00 PM

## 2012-01-19 NOTE — Telephone Encounter (Signed)
Gave pt appt for March 2014 lab and ML °

## 2012-02-01 ENCOUNTER — Emergency Department (HOSPITAL_COMMUNITY): Payer: No Typology Code available for payment source

## 2012-02-01 ENCOUNTER — Emergency Department (HOSPITAL_COMMUNITY)
Admission: EM | Admit: 2012-02-01 | Discharge: 2012-02-01 | Disposition: A | Discharge status: Home / Self Care | Payer: No Typology Code available for payment source | Attending: Emergency Medicine | Admitting: Emergency Medicine

## 2012-02-01 DIAGNOSIS — Y92410 Unspecified street and highway as the place of occurrence of the external cause: Secondary | ICD-10-CM | POA: Insufficient documentation

## 2012-02-01 DIAGNOSIS — S39012A Strain of muscle, fascia and tendon of lower back, initial encounter: Secondary | ICD-10-CM

## 2012-02-01 DIAGNOSIS — S161XXA Strain of muscle, fascia and tendon at neck level, initial encounter: Secondary | ICD-10-CM

## 2012-02-01 DIAGNOSIS — E7800 Pure hypercholesterolemia, unspecified: Secondary | ICD-10-CM | POA: Insufficient documentation

## 2012-02-01 DIAGNOSIS — S139XXA Sprain of joints and ligaments of unspecified parts of neck, initial encounter: Secondary | ICD-10-CM | POA: Insufficient documentation

## 2012-02-01 DIAGNOSIS — Z79899 Other long term (current) drug therapy: Secondary | ICD-10-CM | POA: Insufficient documentation

## 2012-02-01 DIAGNOSIS — Z7982 Long term (current) use of aspirin: Secondary | ICD-10-CM | POA: Insufficient documentation

## 2012-02-01 DIAGNOSIS — S335XXA Sprain of ligaments of lumbar spine, initial encounter: Secondary | ICD-10-CM | POA: Insufficient documentation

## 2012-02-01 DIAGNOSIS — Z85038 Personal history of other malignant neoplasm of large intestine: Secondary | ICD-10-CM | POA: Insufficient documentation

## 2012-02-01 DIAGNOSIS — K219 Gastro-esophageal reflux disease without esophagitis: Secondary | ICD-10-CM | POA: Insufficient documentation

## 2012-02-01 DIAGNOSIS — Z8669 Personal history of other diseases of the nervous system and sense organs: Secondary | ICD-10-CM | POA: Insufficient documentation

## 2012-02-01 DIAGNOSIS — I10 Essential (primary) hypertension: Secondary | ICD-10-CM | POA: Insufficient documentation

## 2012-02-01 DIAGNOSIS — Z87891 Personal history of nicotine dependence: Secondary | ICD-10-CM | POA: Insufficient documentation

## 2012-02-01 DIAGNOSIS — Y9389 Activity, other specified: Secondary | ICD-10-CM | POA: Insufficient documentation

## 2012-02-01 MED ORDER — OXYCODONE-ACETAMINOPHEN 5-325 MG PO TABS
2.0000 | ORAL_TABLET | Freq: Once | ORAL | Status: AC
  Administered 2012-02-01: 2 via ORAL
  Filled 2012-02-01: qty 2

## 2012-02-01 MED ORDER — OXYCODONE-ACETAMINOPHEN 5-325 MG PO TABS: 1.0000 | ORAL_TABLET | Freq: Four times a day (QID) | ORAL | Status: DC | PRN

## 2012-02-01 NOTE — ED Provider Notes (Signed)
Medical screening examination/treatment/procedure(s) were performed by non-physician practitioner and as supervising physician I was immediately available for consultation/collaboration.  Ethelda Chick, MD 02/01/12 [phone]
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
## 2012-02-01 NOTE — ED Notes (Signed)
Pt returned from xray
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
## 2012-02-12 ENCOUNTER — Encounter (INDEPENDENT_AMBULATORY_CARE_PROVIDER_SITE_OTHER): Payer: Self-pay

## 2012-02-12 ENCOUNTER — Telehealth (INDEPENDENT_AMBULATORY_CARE_PROVIDER_SITE_OTHER): Payer: Self-pay

## 2012-02-12 NOTE — Telephone Encounter (Signed)
Message copied by Brennan Bailey on Fri Feb 12, 2012 10:09 AM ------      Message from: Marchia Bond      Created: Thu Feb 11, 2012  9:19 AM      Regarding: needs work note      Contact: [phone]       Dr Luisa Hart pt needs a work note saying he can lift 40 lbs over his head and carry 70 lbs. Call pt when ready so he can come pick it up. Call back at [phone] or cell [phone]

## 2012-02-12 NOTE — Telephone Encounter (Signed)
LMOM that note is at front desk check in for him to pick up.

## 2012-03-22 ENCOUNTER — Ambulatory Visit: Payer: Medicare Other | Admitting: Nurse Practitioner

## 2012-03-22 ENCOUNTER — Other Ambulatory Visit: Payer: Medicare Other | Admitting: Lab

## 2012-04-19 ENCOUNTER — Telehealth: Payer: Self-pay | Admitting: Oncology

## 2012-04-19 ENCOUNTER — Other Ambulatory Visit: Payer: Medicare Other

## 2012-04-19 ENCOUNTER — Ambulatory Visit: Payer: Medicare Other | Admitting: Nurse Practitioner

## 2012-04-19 NOTE — Telephone Encounter (Signed)
Pt called and r/s appt for March 17th lab and ML

## 2012-04-25 ENCOUNTER — Telehealth: Payer: Self-pay | Admitting: Oncology

## 2012-04-25 ENCOUNTER — Ambulatory Visit (HOSPITAL_BASED_OUTPATIENT_CLINIC_OR_DEPARTMENT_OTHER): Payer: Medicare Other | Admitting: Nurse Practitioner

## 2012-04-25 ENCOUNTER — Other Ambulatory Visit (HOSPITAL_BASED_OUTPATIENT_CLINIC_OR_DEPARTMENT_OTHER): Payer: Medicare Other

## 2012-04-25 VITALS — BP 127/71 | HR 86 | Temp 97.6°F | Resp 20 | Ht 77.5 in | Wt 199.4 lb

## 2012-04-25 DIAGNOSIS — C187 Malignant neoplasm of sigmoid colon: Secondary | ICD-10-CM

## 2012-04-25 DIAGNOSIS — C186 Malignant neoplasm of descending colon: Secondary | ICD-10-CM

## 2012-04-25 DIAGNOSIS — D6959 Other secondary thrombocytopenia: Secondary | ICD-10-CM

## 2012-04-25 DIAGNOSIS — C189 Malignant neoplasm of colon, unspecified: Secondary | ICD-10-CM

## 2012-04-25 NOTE — Telephone Encounter (Signed)
Gave pt appt for lab and MD , gave pt oral contrast for September 2014

## 2012-04-25 NOTE — Progress Notes (Signed)
OFFICE PROGRESS NOTE  Interval history:  Patrick Woodward returns as scheduled. He feels well. He denies abdominal pain. Bowels overall moving regularly. He notes constipation when on a long distance bus trip. No hematochezia or melena. He has a good appetite. No nausea or vomiting. The numbness in the fingertips and toes is better.   Objective: Blood pressure 127/71, pulse 86, temperature 97.6 F (36.4 C), temperature source Oral, resp. rate 20, height 6' 5.5" (1.969 m), weight 199 lb 6.4 oz (90.447 kg).  Oropharynx is without thrush or ulceration. No palpable cervical, supraclavicular, axillary or inguinal lymph nodes. Lungs are clear. Regular cardiac rhythm. Abdomen is soft and nontender. No organomegaly. No mass. Extremities are without edema.  Lab Results: Lab Results  Component Value Date   WBC 6.2 12/05/2011   HGB 11.4* 12/05/2011   HCT 35.4* 12/05/2011   MCV 84.5 12/05/2011   PLT 133* 12/05/2011    Chemistry:    Chemistry      Component Value Date/Time   NA 139 12/05/2011 0630   K 3.9 12/05/2011 0630   CL 106 12/05/2011 0630   CO2 27 12/05/2011 0630   BUN 10 12/05/2011 0630   CREATININE 1.21 12/05/2011 0630      Component Value Date/Time   CALCIUM 8.4 12/05/2011 0630   ALKPHOS 55 12/05/2011 0630   AST 28 12/05/2011 0630   ALT 16 12/05/2011 0630   BILITOT 0.9 12/05/2011 0630       Studies/Results: No results found.  Medications: I have reviewed the patient's current medications.  Assessment/Plan:  1. 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 Status post cycle #1 adjuvant FOLFOX chemotherapy 12/01/2010. He completed cycle 2 beginning 12/15/2010. He completed cycle 3 beginning 01/05/2011 with Neulasta support. He completed cycle 11 on 05/04/2011. He completed the 12th and final cycle on 05/18/2011. Negative colonoscopy 09/08/2011, benign  polypoid lesion in the descending colon. CEA 1.3 on 09/14/2011. Restaging CT scans 10/16/2011 with no evidence of metastatic disease. 2. History of a bowel obstruction secondary to the primary colon mass. 3. History of abdominal pain/constipation secondary to the descending/sigmoid colon mass. 4. History of weight loss. Improved. 5. Status post Port-A-Cath placement 11/27/2010. Removed 12/02/2011. 6. History of neutropenia secondary to chemotherapy. Cycle 3 FOLFOX was held 12/29/2010 due to neutropenia. He received Neulasta with cycle #3 FOLFOX chemotherapy on 01/05/2011. He did not receive Neulasta following cycle 4. Cycle 5 was held for one week due to neutropenia. He received Neulasta with subsequent treatments. Neulasta was held beginning with cycle 10 since the oxaliplatin was discontinued. 7. Thrombocytopenia secondary to chemotherapy. Oxaliplatin was dose reduced beginning with cycle 7. 8. Back pain following cycle 3 FOLFOX. Likely related to Neulasta. 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 Improved.  10. Delayed nausea following cycle 7 of FOLFOX. Aloxi was added to the antiemetic regimen beginning with cycle 8 FOLFOX. He also began prophylactic Decadron on days 2 and 3 following chemotherapy with cycle 8. He did not have nausea following cycle 8 or cycle 9 FOLFOX 11. Colostomy reversal 12/02/2011.  Disposition-Patrick Woodward appears well. He remains in clinical remission from colon cancer. We will followup on the CEA from today. He will return for a followup visit in 6 months with labs and CT scans approximately 1 week prior to the visit. He will contact the office in the interim with any problems.  Plan reviewed with  Dr. Truett Perna.  Lonna Cobb ANP/GNP-BC

## 2012-05-03 ENCOUNTER — Ambulatory Visit: Payer: Medicare Other | Admitting: Nurse Practitioner

## 2012-07-18 ENCOUNTER — Encounter (INDEPENDENT_AMBULATORY_CARE_PROVIDER_SITE_OTHER): Payer: Self-pay | Admitting: Surgery

## 2012-07-18 ENCOUNTER — Ambulatory Visit (INDEPENDENT_AMBULATORY_CARE_PROVIDER_SITE_OTHER): Payer: Medicare Other | Admitting: Surgery

## 2012-07-18 VITALS — BP 116/70 | HR 66 | Temp 98.4°F | Resp 16 | Ht 77.5 in | Wt 208.0 lb

## 2012-07-18 DIAGNOSIS — Z85038 Personal history of other malignant neoplasm of large intestine: Secondary | ICD-10-CM

## 2012-07-18 NOTE — Patient Instructions (Signed)
Return 1 year. 

## 2012-07-18 NOTE — Progress Notes (Signed)
Patient ID: Patrick Woodward, male   DOB: [birth year], 72 y.o.   MRN: [id number]  Chief Complaint  Patient presents with  . Follow-up    59mo f/u s/p colostomy takedown    HPI Patrick Woodward is a 73 y.o. male.   HPI The patient returns to clinic in followup history sigmoid colectomy and colostomy in September 2012 for stage III colon cancer. He had a colostomy due to obstruction and has just completed chemotherapy. He is doing well.Colstomy closed  11/2011 . He has no complaints.  Past Medical History  Diagnosis Date  . Colon cancer 10/2010    Stage III-mod differentiated adeno  . Hypercholesteremia   . Hypertension   . GERD (gastroesophageal reflux disease)   . 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 Portacath placement  11/27/10    Dr. Red Christians chest  . Colonoscopy    . Colostomy closure  12/02/2011  . Port-a-cath removal  12/02/2011  . Colon surgery  10/20/10    colon resection w/colostomy/Hartmann pouch procedure  . Colostomy closure  12/02/2011    Procedure: COLOSTOMY CLOSURE;  Surgeon: Clovis Pu. Tamika Shropshire, MD;  Location: [facility] OR;  Service: General;  Laterality: N/A;  . Port-a-cath removal  12/02/2011    Procedure: REMOVAL PORT-A-CATH;  Surgeon: Clovis Pu. Destin Kittler, MD;  Location: [facility] OR;  Service: General;  Laterality: Left;    Family History  Problem Relation Age of Onset  . Cancer Mother     colon  . Cancer Father     prostate    Social History History  Substance Use Topics  . Smoking status: Former Smoker -- 1.00 packs/day for 20 years    Types: Cigarettes, Pipe, Cigars  . Smokeless tobacco: Never Used     Comment: 12/02/2011 "stopped smoking ~ 30 yr ago"  . Alcohol Use: No    Allergies  Allergen Reactions  . Procardia (Nifedipine) Other (See Comments)    irratic heart rate     Current Outpatient Prescriptions  Medication Sig Dispense Refill  . aspirin 325 MG EC tablet Take  325 mg by mouth daily.      Marland Kitchen atorvastatin (LIPITOR) 40 MG tablet Take 40 mg by mouth daily.      . metoprolol (TOPROL-XL) 50 MG 24 hr tablet Take 50 mg by mouth daily.       . Multiple Vitamin (MULTIVITAMIN PO) Take 1 tablet by mouth daily.       . vitamin E (VITAMIN E) 400 UNIT capsule Take 400 Units by mouth daily.        Marland Kitchen VYTORIN 10-20 MG per tablet Take 1 tablet by mouth at bedtime.        No current facility-administered medications for this visit.    Review of Systems Review of Systems  Constitutional: Negative for fever, chills and unexpected weight change.  HENT: Negative for hearing loss, congestion, sore throat, trouble swallowing and voice change.   Eyes: Negative for visual disturbance.  Respiratory: Negative for cough and wheezing.   Cardiovascular: Negative for chest pain, palpitations and leg swelling.  Gastrointestinal: Negative for nausea, vomiting, abdominal pain, diarrhea, constipation, blood in stool, abdominal distention, anal bleeding and rectal pain.  Genitourinary: Negative for hematuria and difficulty urinating.  Musculoskeletal: Negative for arthralgias.  Skin: Negative for rash and wound.  Neurological: Negative for seizures, syncope, weakness and headaches.  Hematological: Negative for adenopathy. Does not bruise/bleed easily.  Psychiatric/Behavioral: Negative for confusion.    Blood pressure 116/70, pulse 66, temperature 98.4 F (36.9 C), temperature source Temporal, resp. rate 16, height 6' 5.5" (1.969 m), weight 208 lb (94.348 kg), SpO2 98.00%.  Physical Exam Physical Exam  Constitutional: He is oriented to person, place, and time. He appears well-developed and well-nourished.  HENT:  Head: Normocephalic and atraumatic.  Eyes: EOM are normal. Pupils are equal, round, and reactive to light.  Neck: Normal range of motion. Neck supple.  Cardiovascular: Normal rate and regular rhythm.   Pulmonary/Chest: Effort normal and breath sounds normal.   Abdominal: Soft. He exhibits no distension. There is no tenderness.   Redundant skin between incision and colostomy scar. No hernia but thin Musculoskeletal: Normal range of motion.  Neurological: He is alert and oriented to person, place, and time.  Skin: Skin is warm and dry.  Psychiatric: He has a normal mood and affect. His behavior is normal. Judgment and thought content normal.      Assessment    Sigmoid colectomy and colostomy 10/2010 due to large bowel obstruction for stage 3 colon cancer.  Doing well.    Plan    Doing well post colostomy closure RTC 1 year.       Valisha Heslin A. 07/18/2012, 1:49 PM

## 2012-10-26 ENCOUNTER — Other Ambulatory Visit: Payer: Self-pay | Admitting: Nurse Practitioner

## 2012-10-26 ENCOUNTER — Other Ambulatory Visit (HOSPITAL_BASED_OUTPATIENT_CLINIC_OR_DEPARTMENT_OTHER): Payer: Medicare Other

## 2012-10-26 ENCOUNTER — Ambulatory Visit (HOSPITAL_COMMUNITY)
Admission: RE | Admit: 2012-10-26 | Discharge: 2012-10-26 | Disposition: A | Discharge status: Home / Self Care | Payer: Medicare Other | Source: Ambulatory Visit | Attending: Nurse Practitioner | Admitting: Nurse Practitioner

## 2012-10-26 ENCOUNTER — Encounter (HOSPITAL_COMMUNITY): Payer: Self-pay

## 2012-10-26 DIAGNOSIS — C189 Malignant neoplasm of colon, unspecified: Secondary | ICD-10-CM

## 2012-10-26 DIAGNOSIS — I7090 Unspecified atherosclerosis: Secondary | ICD-10-CM | POA: Insufficient documentation

## 2012-10-26 DIAGNOSIS — K7689 Other specified diseases of liver: Secondary | ICD-10-CM | POA: Insufficient documentation

## 2012-10-26 LAB — CBC WITH DIFFERENTIAL/PLATELET
Basophils Absolute: 0.1 10*3/uL (ref 0.0–0.1)
EOS%: 2.1 % (ref 0.0–7.0)
HGB: 15 g/dL (ref 13.0–17.1)
LYMPH%: 27.1 % (ref 14.0–49.0)
MCH: 28.2 pg (ref 27.2–33.4)
MCV: 84.3 fL (ref 79.3–98.0)
MONO%: 13.2 % (ref 0.0–14.0)
NEUT%: 56.6 % (ref 39.0–75.0)
Platelets: 158 10*3/uL (ref 140–400)
RDW: 16.4 % — ABNORMAL HIGH (ref 11.0–14.6)

## 2012-10-26 LAB — COMPREHENSIVE METABOLIC PANEL (CC13)
Alkaline Phosphatase: 75 U/L (ref 40–150)
BUN: 12.3 mg/dL (ref 7.0–26.0)
CO2: 29 mEq/L (ref 22–29)
Creatinine: 1.5 mg/dL — ABNORMAL HIGH (ref 0.7–1.3)
Glucose: 99 mg/dl (ref 70–140)
Sodium: 142 mEq/L (ref 136–145)
Total Bilirubin: 0.63 mg/dL (ref 0.20–1.20)

## 2012-10-26 MED ORDER — IOHEXOL 300 MG/ML  SOLN
100.0000 mL | Freq: Once | INTRAMUSCULAR | Status: AC | PRN
  Administered 2012-10-26: 100 mL via INTRAVENOUS

## 2012-10-28 ENCOUNTER — Ambulatory Visit (HOSPITAL_BASED_OUTPATIENT_CLINIC_OR_DEPARTMENT_OTHER): Payer: Medicare Other | Admitting: Lab

## 2012-10-28 ENCOUNTER — Telehealth: Payer: Self-pay | Admitting: Oncology

## 2012-10-28 ENCOUNTER — Ambulatory Visit (HOSPITAL_BASED_OUTPATIENT_CLINIC_OR_DEPARTMENT_OTHER): Payer: Medicare Other | Admitting: Oncology

## 2012-10-28 VITALS — BP 136/76 | HR 81 | Temp 98.5°F | Resp 20 | Ht 77.5 in | Wt 207.2 lb

## 2012-10-28 DIAGNOSIS — C189 Malignant neoplasm of colon, unspecified: Secondary | ICD-10-CM

## 2012-10-28 LAB — BASIC METABOLIC PANEL (CC13)
BUN: 10.7 mg/dL (ref 7.0–26.0)
CO2: 27 mEq/L (ref 22–29)
Chloride: 106 mEq/L (ref 98–109)
Glucose: 94 mg/dl (ref 70–140)
Potassium: 4.3 mEq/L (ref 3.5–5.1)
Sodium: 140 mEq/L (ref 136–145)

## 2012-10-28 NOTE — Telephone Encounter (Signed)
gv pt appt schedule for March 2015.  °

## 2012-10-28 NOTE — Progress Notes (Signed)
   [facility]    OFFICE PROGRESS NOTE   INTERVAL HISTORY:    He returns as scheduled. He feels well. Good appetite and energy level. No difficulty with bowel function. He continues to have numbness in the fingers and feet. This has improved and does not interfere with activity. He has some pain at the soles.  Objective:  Vital signs in last 24 hours:  Blood pressure 136/76, pulse 81, temperature 98.5 F (36.9 C), temperature source Oral, resp. rate 20, height 6' 5.5" (1.969 m), weight 207 lb 3.2 oz (93.985 kg).    HEENT: Neck without mass Lymphatics: No cervical, supraclavicular, axillary, or inguinal nodes Resp: Lungs clear bilaterally Cardio: Regular rate and rhythm GI: No hepatosplenomegaly, nontender, no mass Vascular: No leg edema   Lab Results:  Lab Results  Component Value Date   WBC 5.2 10/26/2012   HGB 15.0 10/26/2012   HCT 44.8 10/26/2012   MCV 84.3 10/26/2012   PLT 158 10/26/2012   ANC 2.9  BUN 12.3, creatinine 1.5 CEA 2.1  X-rays: Restaging CTs of the chest, abdomen, and pelvis on 10/26/2012-no evidence of metastatic disease in the chest, interval reversal of descending colostomy, no evidence of metastatic disease  Medications: I have reviewed the patient's current medications.  Assessment/Plan: 1. 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 Status post cycle #1 adjuvant FOLFOX chemotherapy 12/01/2010. He completed cycle 2 beginning 12/15/2010. He completed cycle 3 beginning 01/05/2011 with Neulasta support. He completed cycle 11 on 05/04/2011. He completed the 12th and final cycle on 05/18/2011. Negative colonoscopy 09/08/2011, benign polypoid lesion in the descending colon.  Restaging CT scans 10/26/2012 with no evidence of metastatic disease. 2. History of a bowel obstruction secondary to the primary colon  mass. 3. History of abdominal pain/constipation secondary to the descending/sigmoid colon mass. 4. History of weight loss. Improved. 5. Status post Port-A-Cath placement 11/27/2010. Removed 12/02/2011. 6. History of neutropenia secondary to chemotherapy. Cycle 3 FOLFOX was held 12/29/2010 due to neutropenia. He received Neulasta with cycle #3 FOLFOX chemotherapy on 01/05/2011. He did not receive Neulasta following cycle 4. Cycle 5 was held for one week due to neutropenia. He received Neulasta with subsequent treatments. Neulasta was held beginning with cycle 10 since the oxaliplatin was discontinued. 7. Thrombocytopenia secondary to chemotherapy. Oxaliplatin was dose reduced beginning with cycle 7. 8. Back pain following cycle 3 FOLFOX. Likely related to Neulasta. 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 Improved.  10. Delayed nausea following cycle 7 of FOLFOX. Aloxi was added to the antiemetic regimen beginning with cycle 8 FOLFOX. He also began prophylactic Decadron on days 2 and 3 following chemotherapy with cycle 8. He did not have nausea following cycle 8 or cycle 9 FOLFOX 11. Colostomy reversal 12/02/2011.       12. Mild elevation of the creatinine 10/26/2012-this will be repeated today   Disposition:  Mr. Salinger is in clinical remission from colon cancer. He will return for an office visit and CEA in 6 months. The neuropathy symptoms have improved. We will repeat a chemistry panel today to followup on the elevated creatinine 10/26/2012.   Thornton Papas, MD  10/28/2012  9:59 AM

## 2012-11-02 ENCOUNTER — Telehealth: Payer: Self-pay | Admitting: *Deleted

## 2012-11-02 NOTE — Telephone Encounter (Signed)
Message copied by Caleb Popp on Wed Nov 02, 2012  9:10 AM ------      Message from: Ladene Artist      Created: Fri Oct 28, 2012  5:49 PM       Please call patient, creatinine is stable, copy to Dr. Renae Gloss      bmet next visit ------

## 2012-11-02 NOTE — Telephone Encounter (Signed)
Called pt with creatinine results, he voiced understanding. Copy forwarded to Dr. Renae Gloss.

## 2013-04-04 ENCOUNTER — Telehealth: Payer: Self-pay | Admitting: *Deleted

## 2013-04-04 ENCOUNTER — Encounter: Payer: Self-pay | Admitting: *Deleted

## 2013-04-04 NOTE — Telephone Encounter (Signed)
Needs letter sent to employer : [employer] to att: Elissa Hefty. Fax [phone]. Has new job and needs MD to say his cancer is in remission.

## 2013-04-04 NOTE — Telephone Encounter (Signed)
Faxed letter after MD signature as requested. Mailed copy to his home.

## 2013-04-06 ENCOUNTER — Telehealth: Payer: Self-pay | Admitting: *Deleted

## 2013-04-06 NOTE — Telephone Encounter (Signed)
Call from pt requesting letter to be faxed to a different #. [phone]. Same done.

## 2013-04-27 ENCOUNTER — Ambulatory Visit (HOSPITAL_BASED_OUTPATIENT_CLINIC_OR_DEPARTMENT_OTHER): Payer: Medicare Other | Admitting: Nurse Practitioner

## 2013-04-27 ENCOUNTER — Telehealth: Payer: Self-pay | Admitting: Oncology

## 2013-04-27 ENCOUNTER — Other Ambulatory Visit (HOSPITAL_BASED_OUTPATIENT_CLINIC_OR_DEPARTMENT_OTHER): Payer: Medicare Other

## 2013-04-27 VITALS — BP 123/79 | HR 89 | Temp 98.5°F | Resp 20 | Ht 77.5 in | Wt 209.4 lb

## 2013-04-27 DIAGNOSIS — C189 Malignant neoplasm of colon, unspecified: Secondary | ICD-10-CM

## 2013-04-27 DIAGNOSIS — C186 Malignant neoplasm of descending colon: Secondary | ICD-10-CM

## 2013-04-27 NOTE — Telephone Encounter (Signed)
Gave pt appt for lab and MD for September 2015 , gave pt oral contrast for CT

## 2013-04-27 NOTE — Progress Notes (Signed)
OFFICE PROGRESS NOTE  Interval history:  Patrick Woodward returns as scheduled. He feels well. He reports a good appetite and energy level. Bowels overall moving regularly. No bloody or black stools. He has occasional abdominal pain which he relates to his diet. No shortness of breath or cough. He has persistent slight numbness in the hands and feet. Overall this does not interfere with activity.   Objective: Filed Vitals:   04/27/13 1046  BP: 123/79  Pulse: 89  Temp: 98.5 F (36.9 C)  Resp: 20   Oropharynx is without thrush or ulceration. No palpable cervical, supraclavicular, axillary or inguinal lymph nodes. Lungs are clear. No wheezes or rales. Regular cardiac rhythm. Abdomen soft and nontender. No mass. No organomegaly. No leg edema. Palms are dry.   Lab Results: Lab Results  Component Value Date   WBC 5.2 10/26/2012   HGB 15.0 10/26/2012   HCT 44.8 10/26/2012   MCV 84.3 10/26/2012   PLT 158 10/26/2012   NEUTROABS 2.9 10/26/2012    Chemistry:    Chemistry      Component Value Date/Time   NA 140 10/28/2012 1021   NA 139 12/05/2011 0630   K 4.3 10/28/2012 1021   K 3.9 12/05/2011 0630   CL 106 12/05/2011 0630   CO2 27 10/28/2012 1021   CO2 27 12/05/2011 0630   BUN 10.7 10/28/2012 1021   BUN 10 12/05/2011 0630   CREATININE 1.5* 10/28/2012 1021   CREATININE 1.21 12/05/2011 0630      Component Value Date/Time   CALCIUM 9.2 10/28/2012 1021   CALCIUM 8.4 12/05/2011 0630   ALKPHOS 75 10/26/2012 0856   ALKPHOS 55 12/05/2011 0630   AST 26 10/26/2012 0856   AST 28 12/05/2011 0630   ALT 25 10/26/2012 0856   ALT 16 12/05/2011 0630   BILITOT 0.63 10/26/2012 0856   BILITOT 0.9 12/05/2011 0630       Studies/Results: No results found.  Medications: I have reviewed the patient's current medications.  Assessment/Plan: 1. 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 Status post cycle #1 adjuvant FOLFOX chemotherapy 12/01/2010. He completed cycle 2 beginning 12/15/2010. He completed cycle 3 beginning 01/05/2011 with Neulasta support. He completed cycle 11 on 05/04/2011. He completed the 12th and final cycle on 05/18/2011. Negative colonoscopy 09/08/2011, benign polypoid lesion in the descending colon. Restaging CT scans 10/26/2012 with no evidence of metastatic disease. 2. History of a bowel obstruction secondary to the primary colon mass. 3. History of abdominal pain/constipation secondary to the descending/sigmoid colon mass. 4. History of weight loss. Improved. 5. Status post Port-A-Cath placement 11/27/2010. Removed 12/02/2011. 6. History of neutropenia secondary to chemotherapy. Cycle 3 FOLFOX was held 12/29/2010 due to neutropenia. He received Neulasta with cycle #3 FOLFOX chemotherapy on 01/05/2011. He did not receive Neulasta following cycle 4. Cycle 5 was held for one week due to neutropenia. He received Neulasta with subsequent treatments. Neulasta was held beginning with cycle 10 since the oxaliplatin was discontinued. 7. Thrombocytopenia secondary to chemotherapy. Oxaliplatin was dose reduced beginning with cycle 7. 8. Back pain following cycle 3 FOLFOX. Likely related to Neulasta. 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 Improved.  10. Delayed nausea following cycle 7 of FOLFOX. Aloxi was added to the antiemetic regimen beginning with cycle 8 FOLFOX. He also began prophylactic Decadron on days 2 and 3 following chemotherapy with cycle 8. He did not  have nausea following cycle 8 or cycle 9 FOLFOX 11. Colostomy reversal 12/02/2011. 12. Mild elevation of the creatinine 10/26/2012. Repeat value on 10/28/2012 stable at 1.5.   Dispositon-he appears stable. He remains in clinical remission from colon cancer. We will followup on the CEA from today.  We referred him for restaging CT scans in 6  months. We will have him come for labs at least 2 days prior to the scan to make sure the creatinine is okay prior to proceeding with contrast.  He will return for a followup visit several days after the CT scan to review the results. He will contact the office in the interim with any problems.   Plan reviewed with Dr. Benay Spice.   Ned Card ANP/GNP-BC

## 2013-04-28 LAB — CEA: CEA: 1.8 ng/mL (ref 0.0–5.0)

## 2013-05-01 ENCOUNTER — Telehealth: Payer: Self-pay | Admitting: *Deleted

## 2013-05-01 NOTE — Telephone Encounter (Signed)
Message copied by Norma Fredrickson on Mon May 01, 2013  9:19 AM ------      Message from: Ladell Pier      Created: Fri Apr 28, 2013  5:39 PM       Please call patient, cea is normal ------

## 2013-05-01 NOTE — Telephone Encounter (Signed)
Called and informed patient of normal cea.  Per Dr. Sherrill.  Patient verbalized understanding.  

## 2013-07-26 IMAGING — CT CT ABD-PELV W/ CM
2 of 5 series · 17 of 46 positions shown, 19 images · IV contrast (APPLIED)
Comparison: None.

CLINICAL DATA: Nausea, vomiting.  Abdominal pain.  Evaluate for
abdominal mass.

CT ABDOMEN AND PELVIS WITH CONTRAST
TECHNIQUE: Multidetector CT imaging of the abdomen and pelvis was
performed following the standard protocol during bolus
administration of intravenous contrast.
Contrast: 100mL OMNIPAQUE IOHEXOL 300 MG/ML IV SOLN

[Series 2: rtn a/p with · axial · 0.85mm/px · z∈[-514,-44]mm · 14 of 106 slices shown, 16 images]
[im 6/106  soft-tissue]
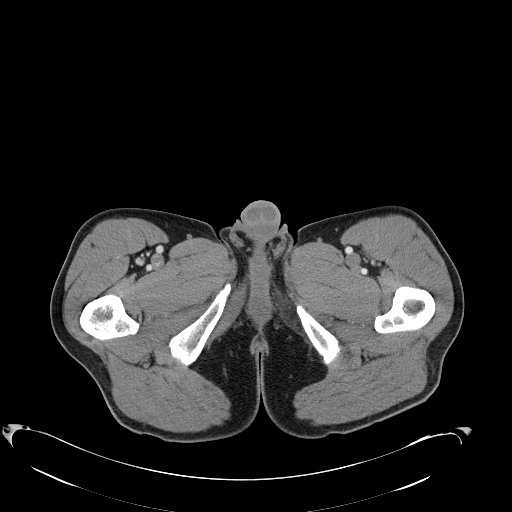
[im 6/106  bone]
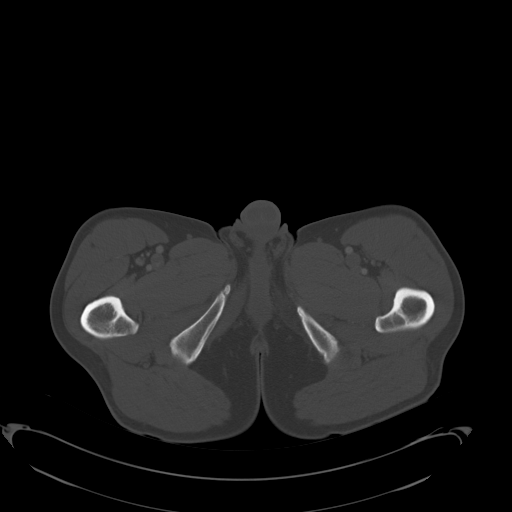
[im 16/106  soft-tissue]
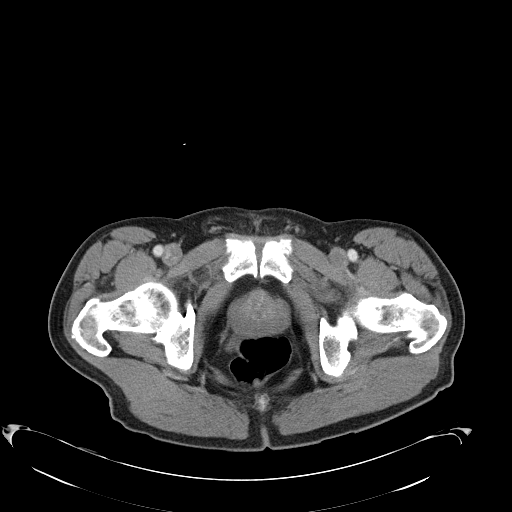
[im 22/106  soft-tissue]
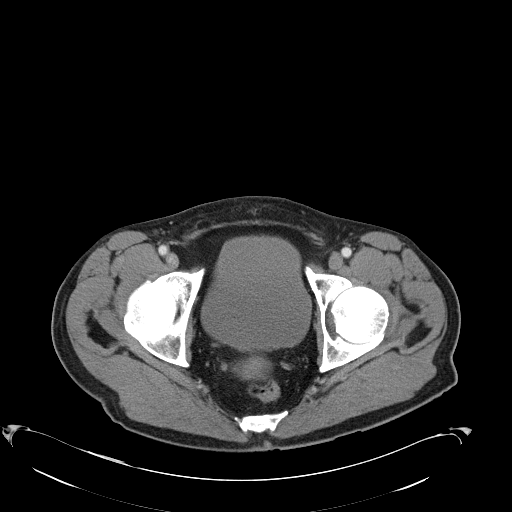
[im 27/106  soft-tissue]
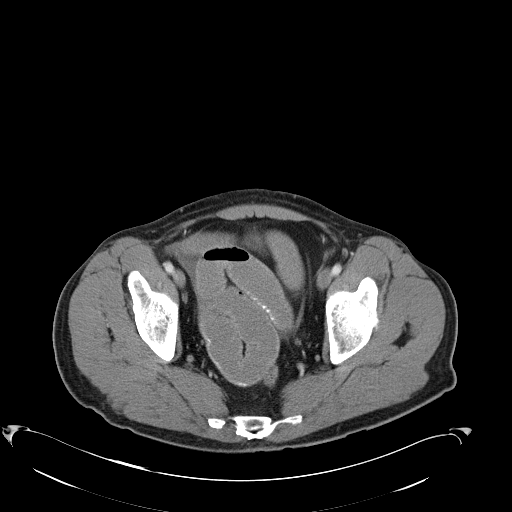
[im 37/106  soft-tissue]
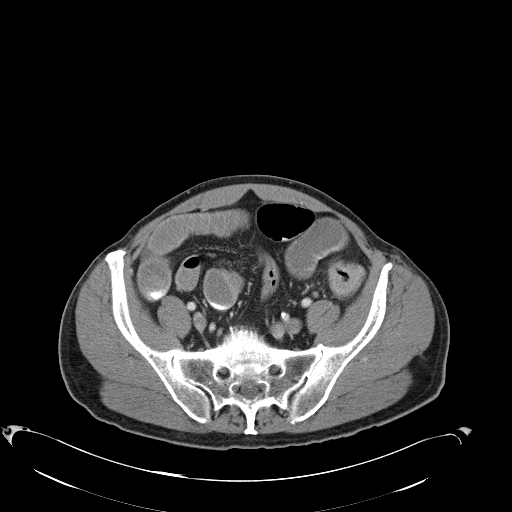
[im 43/106  soft-tissue]
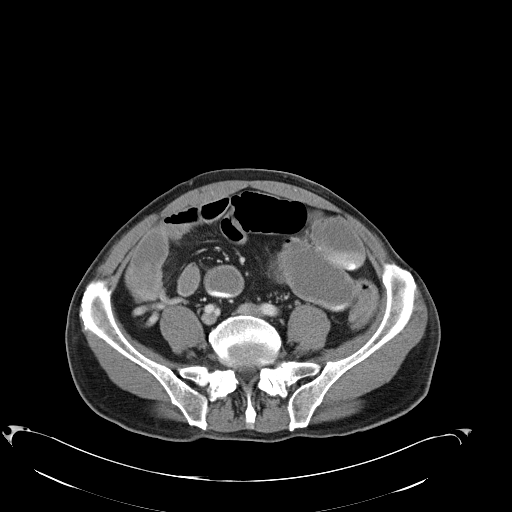
[im 48/106  soft-tissue]
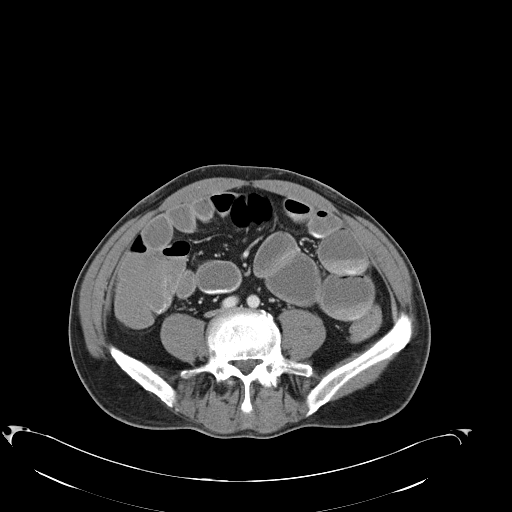
[im 58/106  soft-tissue]
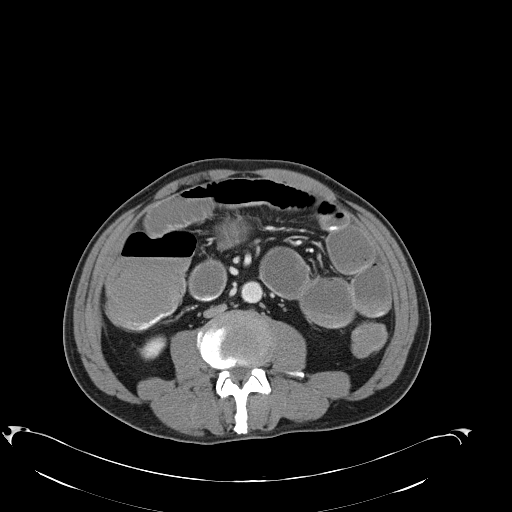
[im 64/106  soft-tissue]
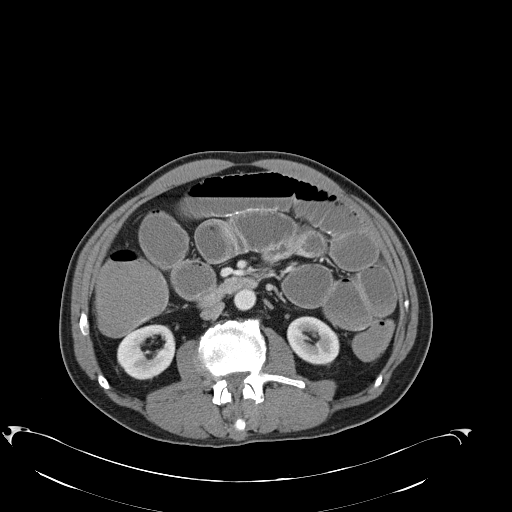
[im 64/106  bone]
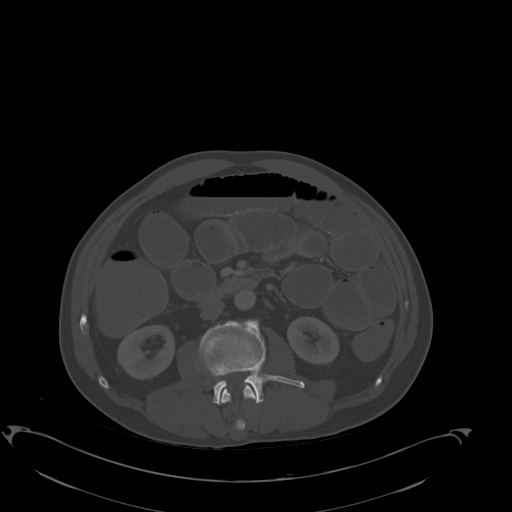
[im 69/106  soft-tissue]
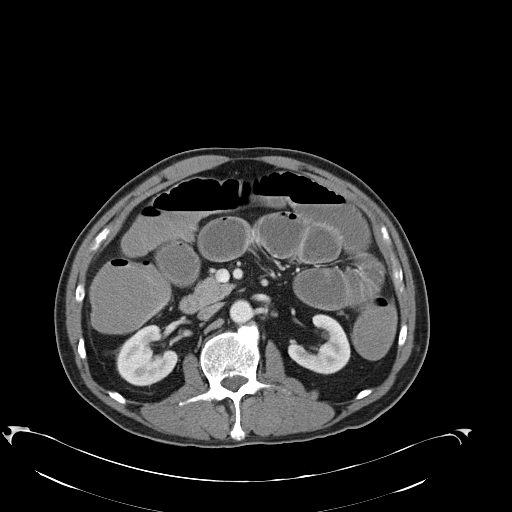
[im 79/106  soft-tissue]
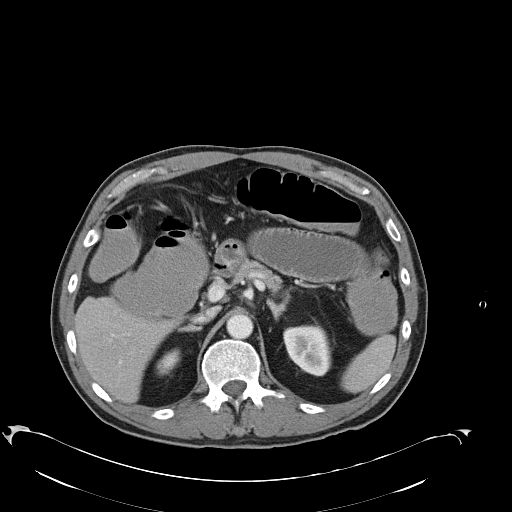
[im 85/106  soft-tissue]
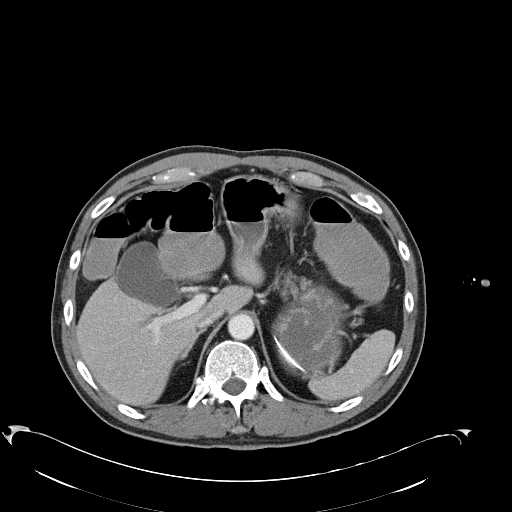
[im 90/106  soft-tissue]
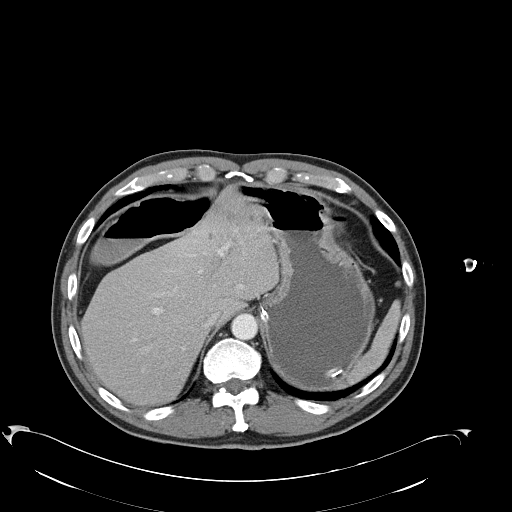
[im 100/106  soft-tissue]
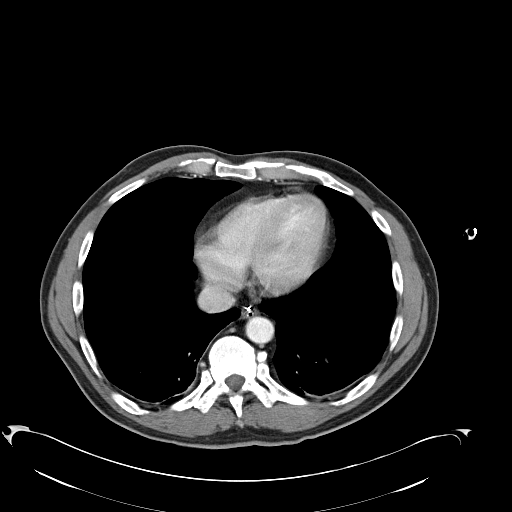

[Series 602: cor · coronal · 1.03mm/px · 3 of 83 slices shown]
[im 28/83  soft-tissue]
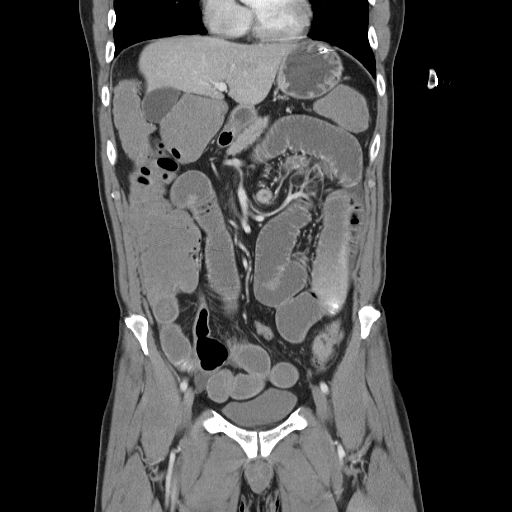
[im 37/83  soft-tissue]
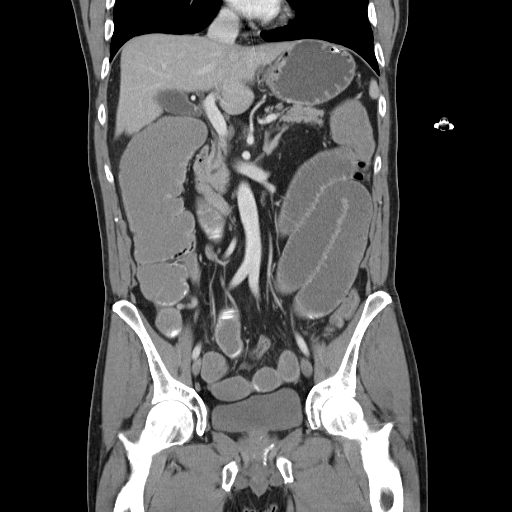
[im 46/83  soft-tissue]
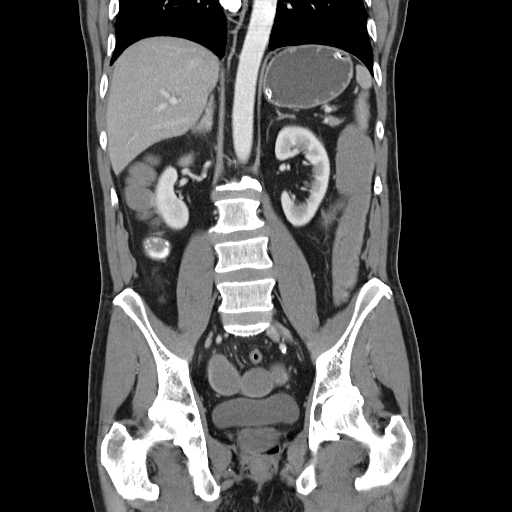

[17 of 46 positions shown; findings below may reference images not displayed]

FINDINGS: There is minimal right lower lobe atelectasis.
Nasogastric tube is in place to the stomach.   There is diffuse
distension of the entire small bowel.  Loops are fluid filled and
are dilated up to 4.1 cm.  No evidence for small bowel mass or
discrete point of obstruction.  There is dilatation of the large
bowel to the level of the splenic flexure.  Beyond this level,
there is smooth tapering to a normal caliber large bowel loops in
the region of the sigmoid colon.  No discrete point of transition
is identified.  No evidence for free intraperitoneal air or free
pelvic fluid.  No abscess.

No focal abnormality identified within the spleen or pancreas.
Small hepatic cysts are present.  The kidneys are unremarkable in
appearance.  The gallbladder is present.  The appendix has a normal
appearance.  Calcifications within the prostate gland. No
retroperitoneal or mesenteric adenopathy.

There is lumbar spondylosis.
IMPRESSION: 1.  Diffuse dilatation of large and small bowel with smooth
tapering of the large bowel from the level of the splenic flexure
to the sigmoid colon.  Findings are most consistent with a
significant ileus.  No evidence for mass or cyst.
2.  No evidence for acute appendicitis.
3.  Small hepatic cysts.

## 2013-07-27 IMAGING — CR DG ABD PORTABLE 1V
1 series · 1 of 1 positions shown · non-contrast
Comparison: CT scan 10/15/2010.

CLINICAL DATA: Abdominal pain.

ABDOMEN - 1 VIEW

[view not recorded]
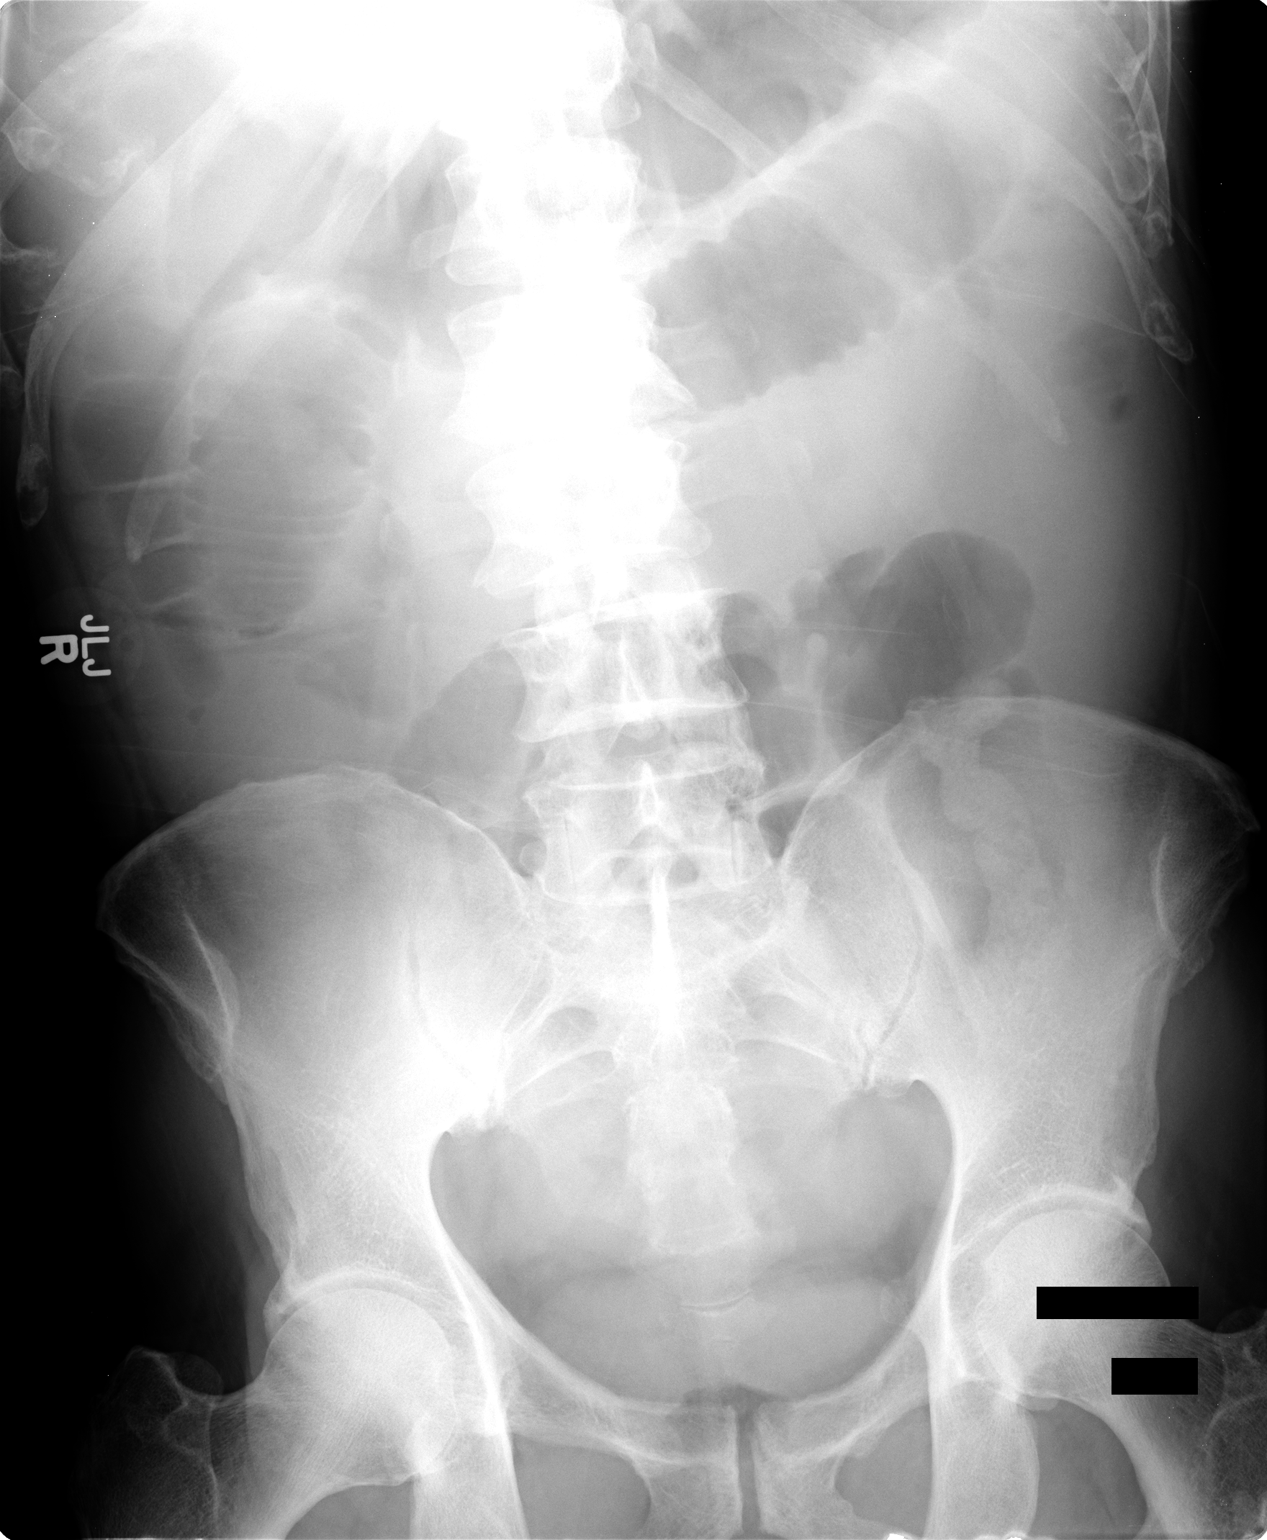

[1 of 1 positions shown; findings below may reference images not displayed]

FINDINGS: Persistent dilated small bowel and colon suggesting a
persistent diffuse ileus.  No free air.  The bony structures are
unremarkable.
IMPRESSION: Persistent air distended small bowel and colon suggesting diffuse
ileus.

## 2013-07-28 IMAGING — CR DG BE W/ CM - WO/W KUB
1 series · 1 of 1 positions shown · IV contrast (agent unspecified)
Comparison: CT dated 10/15/2010

CLINICAL DATA: Evaluate distal bowel obstruction

SINGLE CONTRAST BARIUM ENEMA
Fluoroscopy Time: 1.6 minutes
Contrast: 450 ml of omni 300

[view not recorded]
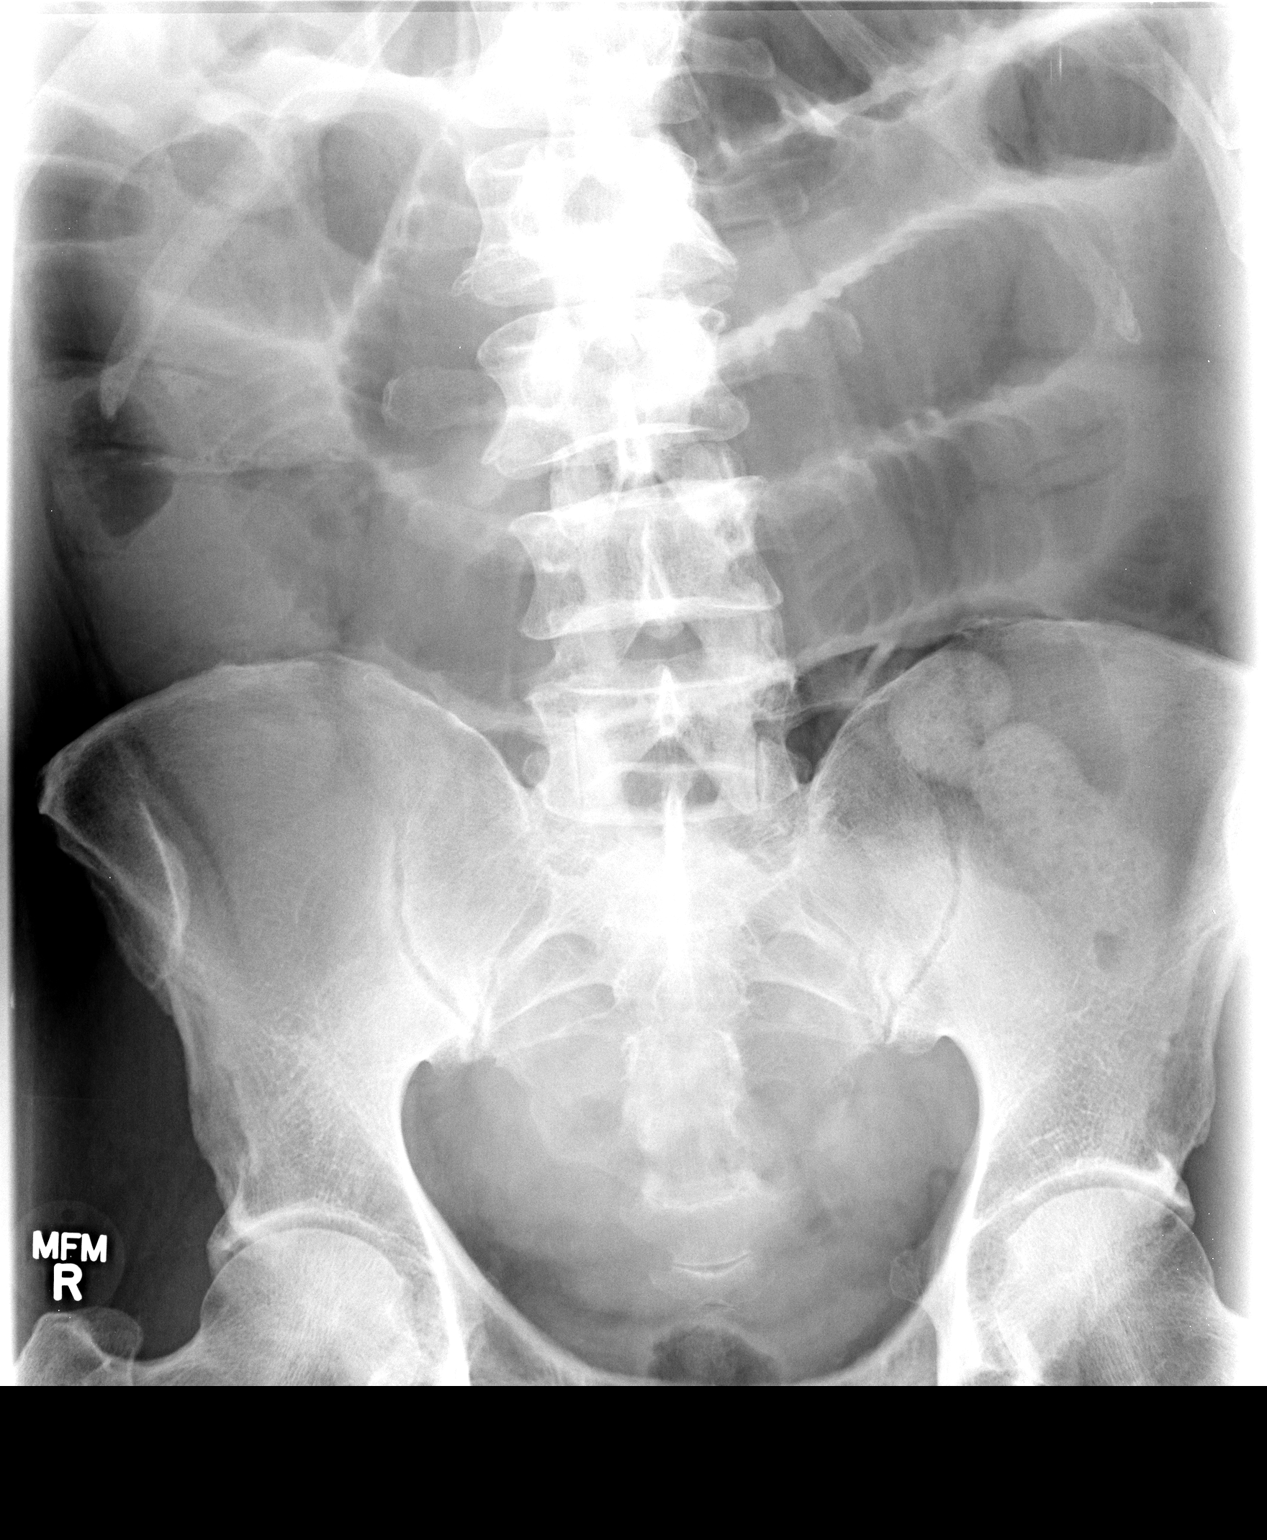

[1 of 1 positions shown; findings below may reference images not displayed]

FINDINGS: Scout radiograph of the abdomen shows marked small bowel
distention and distention of the proximal colon.  Digital rectal
exam was performed and no mass was palpated.  The enema tip was
then placed into the rectum and the retaining balloon insufflated.
Water soluble contrast material was then allowed to flow in a
retrograde fashion opacifying the sigmoid colon and rectum.  At the
junction between the sigmoid colon and the distal descending colon
there is a focal area of marked luminal narrowing with "overhanging
edges" compatible with circumferential colonic lesion. A small
amount of contrast passed into the descending colon.
IMPRESSION: 1.  Apple core lesion is identified at the junction between the
descending colon and sigmoid colon.  Finding is concerning for
malignant stricture secondary to colonic neoplasm.

## 2013-08-25 ENCOUNTER — Other Ambulatory Visit: Payer: Self-pay | Admitting: Gastroenterology

## 2013-08-28 ENCOUNTER — Encounter (HOSPITAL_COMMUNITY): Payer: Self-pay | Admitting: *Deleted

## 2013-08-28 ENCOUNTER — Encounter (HOSPITAL_COMMUNITY)
Admission: RE | Disposition: A | Discharge status: Home / Self Care | Payer: Self-pay | Source: Ambulatory Visit | Attending: Gastroenterology

## 2013-08-28 ENCOUNTER — Ambulatory Visit (HOSPITAL_COMMUNITY)
Admission: RE | Admit: 2013-08-28 | Discharge: 2013-08-28 | Disposition: A | Discharge status: Home / Self Care | Payer: Medicare Other | Source: Ambulatory Visit | Attending: Gastroenterology | Admitting: Gastroenterology

## 2013-08-28 DIAGNOSIS — Z860100 Personal history of colon polyps, unspecified: Secondary | ICD-10-CM | POA: Insufficient documentation

## 2013-08-28 DIAGNOSIS — K648 Other hemorrhoids: Secondary | ICD-10-CM | POA: Insufficient documentation

## 2013-08-28 DIAGNOSIS — Z85038 Personal history of other malignant neoplasm of large intestine: Secondary | ICD-10-CM | POA: Diagnosis not present

## 2013-08-28 DIAGNOSIS — D126 Benign neoplasm of colon, unspecified: Secondary | ICD-10-CM | POA: Diagnosis not present

## 2013-08-28 DIAGNOSIS — Z1211 Encounter for screening for malignant neoplasm of colon: Secondary | ICD-10-CM | POA: Insufficient documentation

## 2013-08-28 HISTORY — PX: COLONOSCOPY: SHX5424

## 2013-08-28 HISTORY — PX: HOT HEMOSTASIS: SHX5433

## 2013-08-28 SURGERY — COLONOSCOPY
Anesthesia: Moderate Sedation

## 2013-08-28 MED ORDER — MIDAZOLAM HCL 10 MG/2ML IJ SOLN
INTRAMUSCULAR | Status: AC
  Filled 2013-08-28: qty 4

## 2013-08-28 MED ORDER — DIPHENHYDRAMINE HCL 50 MG/ML IJ SOLN
INTRAMUSCULAR | Status: AC
  Filled 2013-08-28: qty 1

## 2013-08-28 MED ORDER — SODIUM CHLORIDE 0.9 % IV SOLN
INTRAVENOUS | Status: DC
  Administered 2013-08-28: 500 mL via INTRAVENOUS

## 2013-08-28 MED ORDER — FENTANYL CITRATE 0.05 MG/ML IJ SOLN
INTRAMUSCULAR | Status: DC | PRN
  Administered 2013-08-28 (×4): 25 ug via INTRAVENOUS

## 2013-08-28 MED ORDER — MIDAZOLAM HCL 5 MG/5ML IJ SOLN
INTRAMUSCULAR | Status: DC | PRN
  Administered 2013-08-28 (×4): 2 mg via INTRAVENOUS

## 2013-08-28 MED ORDER — FENTANYL CITRATE 0.05 MG/ML IJ SOLN
INTRAMUSCULAR | Status: AC
  Filled 2013-08-28: qty 4

## 2013-08-28 NOTE — Addendum Note (Signed)
Addended by: Wilford Corner on: 08/28/2013 06:42 AM   Modules accepted: Orders

## 2013-08-28 NOTE — Discharge Instructions (Signed)
No aspirin products for 2 weeks. Will cal when path results complete.

## 2013-08-28 NOTE — Op Note (Signed)
[place], [address]   COLONOSCOPY PROCEDURE REPORT  PATIENT: Patrick Woodward, Patrick Woodward  MR#: [id number] BIRTHDATE: [birth year] , 72  yrs. old GENDER: Male ENDOSCOPIST: Wilford Corner, MD REFERRED BY: PROCEDURE DATE:  08/28/2013 PROCEDURE:   Colonoscopy with snare polypectomy ASA CLASS:   Class II INDICATIONS:history of adenomatous colon polyps; history of colon cancer MEDICATIONS: Fentanyl 100 mcg IV and Versed 8 mg IV  DESCRIPTION OF PROCEDURE:   After the risks benefits and alternatives of the procedure were thoroughly explained, informed consent was obtained.  The Pentax Ped Colon [id number]  endoscope was introduced through the anus and advanced to the cecum, which was identified by both the appendix and ileocecal valve , limited by No adverse events experienced.   The quality of the prep was good. . The instrument was then slowly withdrawn as the colon was fully examined.     FINDINGS:  Rectal exam unremarkable.  Pediatric colonoscope inserted into the colon and advanced to the cecum, where the appendiceal orifice and ileocecal valve were identified. During insertion the previously tattooed area was noted and a 2 cm multi-lobulated polyp was noted.   The terminal ileum was intubated and was normal in appearance. On careful withdrawal of the colonoscope a 4 mm sessile polyp was seen in the ascending colon and removed with snare cautery but no polyp tissue could be retrieved.    The polyp at the tattooed area in the descending colon was again seen on withdrawal and removed ina piecemeal fashion with snare cautery. Argon plasma coagulation (APC) was also used to fulgurate a small part of the polyp and results were successful. A 4 mm sessile polyp was noted near the surgical anastomosis (colocolonic) at the rectosigmoid colon and was removed with snare cautery. The anastomosis was normal in appearance. A 5 mm pedunculated polyp was  removed with snare cautery in the rectum but the polyp tissue was not able to be retrieved. Three flat small (45mm-3mm) sized polyps were removed with a cold biopsy forceps.   Retroflexion revealed small internal hemorrhoids.  COMPLICATIONS: None  IMPRESSION:     Colon polyps X 7 - see above Internal hemorrhoids Normal-appearing colocolonic anastomosis  RECOMMENDATIONS: No aspirin products for 2 weeks; F/U on path    ______________________________ eSigned:  Wilford Corner, MD 08/28/2013 10:04 AM   CC:Kim Karlton Lemon, MD Kavin Leech, MD  PATIENT NAME:  Patrick Woodward, Patrick Woodward MR#: [id number]

## 2013-08-28 NOTE — H&P (Signed)
  Date of Initial H&P: 08/21/13  History reviewed, patient examined, no change in status, stable for surgery.

## 2013-08-28 NOTE — Interval H&P Note (Signed)
History and Physical Interval Note:  08/28/2013 7:51 AM  Patrick Woodward  has presented today for surgery, with the diagnosis of bleeding  The various methods of treatment have been discussed with the patient and family. After consideration of risks, benefits and other options for treatment, the patient has consented to  Procedure(s): COLONOSCOPY (N/A) HOT HEMOSTASIS (ARGON PLASMA COAGULATION/BICAP) (N/A) as a surgical intervention .  The patient's history has been reviewed, patient examined, no change in status, stable for surgery.  I have reviewed the patient's chart and labs.  Questions were answered to the patient's satisfaction.     [place] C.

## 2013-08-29 ENCOUNTER — Encounter (HOSPITAL_COMMUNITY): Payer: Self-pay | Admitting: Gastroenterology

## 2013-10-23 ENCOUNTER — Telehealth: Payer: Self-pay | Admitting: Oncology

## 2013-10-23 NOTE — Telephone Encounter (Signed)
lmonvm for pt re new appt for 9/25. moved from 9/18. lb/ct 9/16 remain the same and appts confirmed. schedule mailed.

## 2013-10-25 ENCOUNTER — Encounter (HOSPITAL_COMMUNITY): Payer: Self-pay

## 2013-10-25 ENCOUNTER — Other Ambulatory Visit (HOSPITAL_BASED_OUTPATIENT_CLINIC_OR_DEPARTMENT_OTHER): Payer: Medicare Other

## 2013-10-25 ENCOUNTER — Ambulatory Visit (HOSPITAL_COMMUNITY)
Admission: RE | Admit: 2013-10-25 | Discharge: 2013-10-25 | Disposition: A | Discharge status: Home / Self Care | Payer: Medicare Other | Source: Ambulatory Visit | Attending: Nurse Practitioner | Admitting: Nurse Practitioner

## 2013-10-25 DIAGNOSIS — C187 Malignant neoplasm of sigmoid colon: Secondary | ICD-10-CM

## 2013-10-25 DIAGNOSIS — I708 Atherosclerosis of other arteries: Secondary | ICD-10-CM | POA: Insufficient documentation

## 2013-10-25 DIAGNOSIS — Z9221 Personal history of antineoplastic chemotherapy: Secondary | ICD-10-CM | POA: Insufficient documentation

## 2013-10-25 DIAGNOSIS — C186 Malignant neoplasm of descending colon: Secondary | ICD-10-CM

## 2013-10-25 DIAGNOSIS — C189 Malignant neoplasm of colon, unspecified: Secondary | ICD-10-CM | POA: Insufficient documentation

## 2013-10-25 DIAGNOSIS — Z9049 Acquired absence of other specified parts of digestive tract: Secondary | ICD-10-CM | POA: Insufficient documentation

## 2013-10-25 LAB — CBC WITH DIFFERENTIAL/PLATELET
BASO%: 1 % (ref 0.0–2.0)
Basophils Absolute: 0.1 10*3/uL (ref 0.0–0.1)
EOS ABS: 0.1 10*3/uL (ref 0.0–0.5)
EOS%: 2.4 % (ref 0.0–7.0)
HEMATOCRIT: 45.8 % (ref 38.4–49.9)
HGB: 14.7 g/dL (ref 13.0–17.1)
LYMPH%: 29.7 % (ref 14.0–49.0)
MCH: 27.6 pg (ref 27.2–33.4)
MCHC: 32.2 g/dL (ref 32.0–36.0)
MCV: 85.6 fL (ref 79.3–98.0)
MONO#: 0.8 10*3/uL (ref 0.1–0.9)
MONO%: 14.9 % — ABNORMAL HIGH (ref 0.0–14.0)
NEUT%: 52 % (ref 39.0–75.0)
NEUTROS ABS: 2.7 10*3/uL (ref 1.5–6.5)
PLATELETS: 146 10*3/uL (ref 140–400)
RBC: 5.35 10*6/uL (ref 4.20–5.82)
RDW: 16.3 % — ABNORMAL HIGH (ref 11.0–14.6)
WBC: 5.2 10*3/uL (ref 4.0–10.3)
lymph#: 1.6 10*3/uL (ref 0.9–3.3)

## 2013-10-25 LAB — COMPREHENSIVE METABOLIC PANEL (CC13)
ALT: 30 U/L (ref 0–55)
ANION GAP: 10 meq/L (ref 3–11)
AST: 62 U/L — ABNORMAL HIGH (ref 5–34)
Albumin: 3.8 g/dL (ref 3.5–5.0)
Alkaline Phosphatase: 70 U/L (ref 40–150)
BILIRUBIN TOTAL: 0.89 mg/dL (ref 0.20–1.20)
BUN: 11.7 mg/dL (ref 7.0–26.0)
CALCIUM: 8.9 mg/dL (ref 8.4–10.4)
CO2: 24 meq/L (ref 22–29)
Chloride: 107 mEq/L (ref 98–109)
Creatinine: 1.5 mg/dL — ABNORMAL HIGH (ref 0.7–1.3)
GLUCOSE: 94 mg/dL (ref 70–140)
Potassium: 4.2 mEq/L (ref 3.5–5.1)
Sodium: 141 mEq/L (ref 136–145)
TOTAL PROTEIN: 7.2 g/dL (ref 6.4–8.3)

## 2013-10-25 LAB — CEA: CEA: 1.4 ng/mL (ref 0.0–5.0)

## 2013-10-25 MED ORDER — IOHEXOL 300 MG/ML  SOLN
80.0000 mL | Freq: Once | INTRAMUSCULAR | Status: AC | PRN
  Administered 2013-10-25: 80 mL via INTRAVENOUS

## 2013-10-27 ENCOUNTER — Ambulatory Visit: Payer: Medicare Other | Admitting: Oncology

## 2013-10-30 ENCOUNTER — Ambulatory Visit: Payer: Medicare Other | Admitting: Oncology

## 2013-11-02 ENCOUNTER — Telehealth: Payer: Self-pay | Admitting: Oncology

## 2013-11-02 NOTE — Telephone Encounter (Deleted)
Per NP/CB scheduled pt w/CB after hours........Patrick Woodward

## 2013-11-02 NOTE — Telephone Encounter (Addendum)
Pt called to r/s apt he is out of town and will c/b to r/s [employer]

## 2013-11-03 ENCOUNTER — Ambulatory Visit: Payer: Medicare Other | Admitting: Oncology

## 2013-11-08 ENCOUNTER — Telehealth: Payer: Self-pay | Admitting: Oncology

## 2013-11-08 NOTE — Telephone Encounter (Signed)
pt call to sched appt...done....pt aware of d.t

## 2013-12-04 ENCOUNTER — Ambulatory Visit (HOSPITAL_BASED_OUTPATIENT_CLINIC_OR_DEPARTMENT_OTHER): Payer: Medicare Other | Admitting: Oncology

## 2013-12-04 ENCOUNTER — Telehealth: Payer: Self-pay | Admitting: Oncology

## 2013-12-04 VITALS — BP 120/79 | HR 71 | Temp 98.2°F | Resp 18 | Ht 77.5 in | Wt 203.9 lb

## 2013-12-04 DIAGNOSIS — C189 Malignant neoplasm of colon, unspecified: Secondary | ICD-10-CM

## 2013-12-04 DIAGNOSIS — C186 Malignant neoplasm of descending colon: Secondary | ICD-10-CM

## 2013-12-04 DIAGNOSIS — C187 Malignant neoplasm of sigmoid colon: Secondary | ICD-10-CM

## 2013-12-04 NOTE — Progress Notes (Signed)
[place] OFFICE PROGRESS NOTE   Diagnosis: Colon cancer  INTERVAL HISTORY:   He returns as scheduled. He feels well. He is working part-time. He underwent a colonoscopy 08/28/2013. A polyp was noted at a previously tattooed area. A 4 mm sessile polyp was seen in the ascending colon and was removed, but not retrieved. The polyp at the tattooed area in the descending colon was removed in a piecemeal fashion. Argon coagulation was used to fulgurate a small part of the polyp. A 4 mm polyp was removed near the surgical anastomosis at the rectosigmoid colon and was removed. A 5 mm polyp was removed in the rectum and was not retrieved. 3 flats small (2-3 mm) these polyps were removed with a cold forceps. The pathology revealed multiple fragments of a tubular adenoma at the descending colon. Negative for high-grade dysplasia or malignancy. The rectosigmoid polyps revealed benign granulation tissue. Additional polyps from the rectum were hyperplastic polyps.  He continues to have numbness in the fingers and toes. This is intermittent.  Objective:  Vital signs in last 24 hours:  Blood pressure 120/79, pulse 71, temperature 98.2 F (36.8 C), temperature source Oral, resp. rate 18, height 6' 5.5" (1.969 m), weight 203 lb 14.4 oz (92.488 kg).    HEENT: Neck without mass Lymphatics: No cervical, supraclavicular, axillary, or inguinal nodes Resp: Lungs clear bilaterally Cardio: Regular rate and rhythm GI: No hepatomegaly, nontender, no mass Vascular: No leg edema    Lab Results:  Lab Results  Component Value Date   WBC 5.2 10/25/2013   HGB 14.7 10/25/2013   HCT 45.8 10/25/2013   MCV 85.6 10/25/2013   PLT 146 10/25/2013   NEUTROABS 2.7 10/25/2013     Lab Results  Component Value Date   CEA 1.4 10/25/2013    Imaging:  CTs of the chest, abdomen, and pelvis 10/25/2013-negative for recurrent disease  Medications: I have reviewed the patient's current  medications.  Assessment/Plan: 1. 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 Status post cycle #1 adjuvant FOLFOX chemotherapy 12/01/2010. He completed cycle 2 beginning 12/15/2010. He completed cycle 3 beginning 01/05/2011 with Neulasta support. He completed cycle 11 on 05/04/2011. He completed the 12th and final cycle on 05/18/2011. Negative colonoscopy 09/08/2011, benign polypoid lesion in the descending colon. Colonoscopy 08/28/2013 with multiple benign polyps removed.  Restaging CT scans 10/26/2012 with no evidence of metastatic disease.  Restaging CT scans Jun 24 2013 with no evidence of recurrent disease 2. History of a bowel obstruction secondary to the primary colon mass. 3. History of abdominal pain/constipation secondary to the descending/sigmoid colon mass. 4. History of weight loss. Improved. 5. Status post Port-A-Cath placement 11/27/2010. Removed 12/02/2011. 6. History of neutropenia secondary to chemotherapy. Cycle 3 FOLFOX was held 12/29/2010 due to neutropenia. He received Neulasta with cycle #3 FOLFOX chemotherapy on 01/05/2011. He did not receive Neulasta following cycle 4. Cycle 5 was held for one week due to neutropenia. He received Neulasta with subsequent treatments. Neulasta was held beginning with cycle 10 since the oxaliplatin was discontinued. 7. Thrombocytopenia secondary to chemotherapy. Oxaliplatin was dose reduced beginning with cycle 7. 8. Back pain following cycle 3 FOLFOX. Likely related to Neulasta. 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 Improved.  10. Delayed nausea following cycle 7 of FOLFOX. Aloxi was added to the antiemetic regimen beginning with cycle 8 FOLFOX. He also began prophylactic Decadron  on days 2 and 3 following chemotherapy with cycle 8. He did not have nausea following  cycle 8 or cycle 9 FOLFOX 11. Colostomy reversal 12/02/2011. 12. Mild elevation of the creatinine 10/26/2012. Repeat value on 10/28/2012 stable at 1.5.  Disposition:  Ms. Kirksey remains in clinical remission from colon cancer. He will return for an office visit and CEA in 6 months. He continues colonoscopy followup with Dr. Michail Sermon.  Betsy Coder, MD  12/04/2013  8:59 AM

## 2013-12-04 NOTE — Telephone Encounter (Signed)
Gave AVS & cal for April 2016

## 2014-01-25 ENCOUNTER — Other Ambulatory Visit: Payer: Self-pay | Admitting: Nurse Practitioner

## 2014-06-05 ENCOUNTER — Other Ambulatory Visit: Payer: Medicare Other

## 2014-06-05 ENCOUNTER — Telehealth: Payer: Self-pay | Admitting: Oncology

## 2014-06-05 ENCOUNTER — Ambulatory Visit (HOSPITAL_BASED_OUTPATIENT_CLINIC_OR_DEPARTMENT_OTHER): Payer: Medicare Other | Admitting: Oncology

## 2014-06-05 VITALS — BP 131/86 | HR 64 | Temp 97.9°F | Resp 19 | Ht 77.5 in | Wt 209.7 lb

## 2014-06-05 DIAGNOSIS — C189 Malignant neoplasm of colon, unspecified: Secondary | ICD-10-CM

## 2014-06-05 DIAGNOSIS — Z85038 Personal history of other malignant neoplasm of large intestine: Secondary | ICD-10-CM

## 2014-06-05 LAB — CEA: CEA: 1.6 ng/mL (ref 0.0–5.0)

## 2014-06-05 NOTE — Telephone Encounter (Signed)
Pt confirmed labs/ov per 04/26 POF, gave pt AVS and Calendar...  KJ °

## 2014-06-05 NOTE — Progress Notes (Signed)
  [place] OFFICE PROGRESS NOTE   Diagnosis: Colon cancer  INTERVAL HISTORY:   Patrick Woodward returns as scheduled. He feels well. Good appetite. No difficulty with bowel function. He is working part-time. He continues to have numbness in the fingers. The numbness continues to improve. Generally the numbness does not interfere with activity, but he notes some difficulty separating paper. No difficulty driving.  Objective:  Vital signs in last 24 hours:  Blood pressure 131/86, pulse 64, temperature 97.9 F (36.6 C), temperature source Oral, resp. rate 19, height 6' 5.5" (1.969 m), weight 209 lb 11.2 oz (95.119 kg), SpO2 99 %.    HEENT: Neck without mass Lymphatics: No cervical, supra-clavicular, axillary, or inguinal nodes Resp: Lungs clear bilaterally Cardio: Regular rate and rhythm GI: No hepatosplenomegaly, nontender, no mass Vascular: No leg edema Neurologic: Mild loss of vibratory sense at the fingertips bilaterally   Lab Results:   Lab Results  Component Value Date   CEA 1.4 10/25/2013    Medications: I have reviewed the patient's current medications.  Assessment/Plan: 1. 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 Status post cycle #1 adjuvant FOLFOX chemotherapy 12/01/2010. He completed cycle 2 beginning 12/15/2010. He completed cycle 3 beginning 01/05/2011 with Neulasta support. He completed cycle 11 on 05/04/2011. He completed the 12th and final cycle on 05/18/2011. Negative colonoscopy 09/08/2011, benign polypoid lesion in the descending colon. Colonoscopy 08/28/2013 with multiple benign polyps removed.  Restaging CT scans 10/26/2012 with no evidence of metastatic disease.  Restaging CT scans Jun 24 2013 with no evidence of recurrent disease 2. History of a bowel obstruction secondary to the primary colon mass. 3. History  of abdominal pain/constipation secondary to the descending/sigmoid colon mass. 4. History of weight loss. Improved. 5. Status post Port-A-Cath placement 11/27/2010. Removed 12/02/2011. 6. History of neutropenia secondary to chemotherapy. Cycle 3 FOLFOX was held 12/29/2010 due to neutropenia. He received Neulasta with cycle #3 FOLFOX chemotherapy on 01/05/2011. He did not receive Neulasta following cycle 4. Cycle 5 was held for one week due to neutropenia. He received Neulasta with subsequent treatments. Neulasta was held beginning with cycle 10 since the oxaliplatin was discontinued. 7. Thrombocytopenia secondary to chemotherapy. Oxaliplatin was dose reduced beginning with cycle 7. 8. Back pain following cycle 3 FOLFOX. Likely related to Neulasta. 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 Improved.  10. Delayed nausea following cycle 7 of FOLFOX. Aloxi was added to the antiemetic regimen beginning with cycle 8 FOLFOX. He also began prophylactic Decadron on days 2 and 3 following chemotherapy with cycle 8. He did not have nausea following cycle 8 or cycle 9 FOLFOX 11. Colostomy reversal 12/02/2011. 12. Mild elevation of the creatinine 10/26/2012. Repeat value on 10/28/2012 stable at 1.5. He is followed by nephrology    Disposition:  Patrick Woodward remains in clinical remission from colon cancer. We will follow-up on the CEA from today. He will return for an office visit and CEA in 6 months. The neuropathy symptoms continue to improve.  Betsy Coder, MD  06/05/2014  9:01 AM

## 2014-06-06 ENCOUNTER — Telehealth: Payer: Self-pay | Admitting: *Deleted

## 2014-06-06 ENCOUNTER — Telehealth: Payer: Self-pay

## 2014-06-06 NOTE — Telephone Encounter (Signed)
Pt returned amy's call. CEA normal per Dr Benay Spice, keep next appt as scheduled. Pt expressed understanding

## 2014-06-06 NOTE — Telephone Encounter (Signed)
-----   Message from Ladell Pier, MD sent at 06/05/2014  8:58 PM EDT ----- Please call patient, cea is normal, f/u as scheduled

## 2014-06-06 NOTE — Telephone Encounter (Signed)
Per Dr. Benay Spice; left voice message on cell to call re: lab work results.

## 2014-07-27 ENCOUNTER — Telehealth: Payer: Self-pay | Admitting: Nurse Practitioner

## 2014-07-27 NOTE — Telephone Encounter (Signed)
Lft msg for pt confirming D/T change due to NP/LT vacation time, changed to MD and time was only 15 mins. different. Mailed out schedule... KJ

## 2014-12-04 ENCOUNTER — Telehealth: Payer: Self-pay | Admitting: Oncology

## 2014-12-04 ENCOUNTER — Ambulatory Visit (HOSPITAL_BASED_OUTPATIENT_CLINIC_OR_DEPARTMENT_OTHER): Payer: Medicare Other | Admitting: Oncology

## 2014-12-04 ENCOUNTER — Other Ambulatory Visit (HOSPITAL_BASED_OUTPATIENT_CLINIC_OR_DEPARTMENT_OTHER): Payer: Medicare Other

## 2014-12-04 VITALS — BP 129/89 | HR 74 | Temp 98.1°F | Resp 18 | Ht 77.5 in | Wt 207.8 lb

## 2014-12-04 DIAGNOSIS — C189 Malignant neoplasm of colon, unspecified: Secondary | ICD-10-CM

## 2014-12-04 DIAGNOSIS — Z85038 Personal history of other malignant neoplasm of large intestine: Secondary | ICD-10-CM

## 2014-12-04 LAB — CEA: CEA: 2.3 ng/mL (ref 0.0–5.0)

## 2014-12-04 NOTE — Telephone Encounter (Signed)
per pof to sch pt appt-gave pt copy of avs °

## 2014-12-04 NOTE — Progress Notes (Signed)
  [place] OFFICE PROGRESS NOTE   Diagnosis:  Colon cancer  INTERVAL HISTORY:   Patrick Woodward returns as scheduled. He feels well. Neuropathy symptoms in the feet have nearly resolved. He has mild intermittent numbness in the fingertips. He has a good appetite. No nausea or vomiting. No abdominal pain. Bowels moving regularly.  Objective:  Vital signs in last 24 hours:  Blood pressure 129/89, pulse 74, temperature 98.1 F (36.7 C), temperature source Oral, resp. rate 18, height 6' 5.5" (1.969 m), weight 207 lb 12.8 oz (94.257 kg), SpO2 97 %.    HEENT: No thrush or ulcers. Lymphatics: No palpable cervical, supraclavicular, axillary or inguinal lymph nodes. Resp: Lungs clear bilaterally. Cardio: Regular rate and rhythm. GI: Abdomen soft and nontender. No hepatomegaly. No mass. Vascular: No leg edema.   Lab Results:  Lab Results  Component Value Date   WBC 5.2 10/25/2013   HGB 14.7 10/25/2013   HCT 45.8 10/25/2013   MCV 85.6 10/25/2013   PLT 146 10/25/2013   NEUTROABS 2.7 10/25/2013    Imaging:  No results found.  Medications: I have reviewed the patient's current medications.  Assessment/Plan: 1. 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 Status post cycle #1 adjuvant FOLFOX chemotherapy 12/01/2010. He completed cycle 2 beginning 12/15/2010. He completed cycle 3 beginning 01/05/2011 with Neulasta support. He completed cycle 11 on 05/04/2011. He completed the 12th and final cycle on 05/18/2011.   Negative colonoscopy 09/08/2011, benign polypoid lesion in the descending colon. Colonoscopy 08/28/2013 with multiple benign polyps removed.  Restaging CT scans 10/26/2012 with no evidence of metastatic disease.  Restaging CT scans 06/24/2013 with no evidence of recurrent disease 2. History of a bowel obstruction secondary to the  primary colon mass. 3. History of abdominal pain/constipation secondary to the descending/sigmoid colon mass. 4. History of weight loss. Improved. 5. Status post Port-A-Cath placement 11/27/2010. Removed 12/02/2011. 6. History of neutropenia secondary to chemotherapy. Cycle 3 FOLFOX was held 12/29/2010 due to neutropenia. He received Neulasta with cycle #3 FOLFOX chemotherapy on 01/05/2011. He did not receive Neulasta following cycle 4. Cycle 5 was held for one week due to neutropenia. He received Neulasta with subsequent treatments. Neulasta was held beginning with cycle 10 since the oxaliplatin was discontinued. 7. Thrombocytopenia secondary to chemotherapy. Oxaliplatin was dose reduced beginning with cycle 7. 8. Back pain following cycle 3 FOLFOX. Likely related to Neulasta. 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 Improved.  10. Delayed nausea following cycle 7 of FOLFOX. Aloxi was added to the antiemetic regimen beginning with cycle 8 FOLFOX. He also began prophylactic Decadron on days 2 and 3 following chemotherapy with cycle 8. He did not have nausea following cycle 8 or cycle 9 FOLFOX 11. Colostomy reversal 12/02/2011. 12. Mild elevation of the creatinine 10/26/2012. Repeat value on 10/28/2012 stable at 1.5. He is followed by nephrology      Disposition: Patrick Woodward remains in clinical remission from colon cancer. We will follow-up on the CEA from today. He will return for a follow-up visit and CEA in 6 months. He will contact the office in the interim with any problems.  Plan reviewed with Dr. Benay Spice.    Ned Card ANP/GNP-BC   12/04/2014  9:01 AM

## 2014-12-05 ENCOUNTER — Telehealth: Payer: Self-pay | Admitting: *Deleted

## 2014-12-05 NOTE — Telephone Encounter (Signed)
-----   Message from Ladell Pier, MD sent at 12/04/2014  7:09 PM EDT ----- Please call patient, cea is normal

## 2014-12-05 NOTE — Telephone Encounter (Signed)
Per Dr. Sherrill; notified pt that cea is normal.  Pt verbalized understanding and expressed appreciation for call. 

## 2015-01-17 ENCOUNTER — Telehealth: Payer: Self-pay

## 2015-01-17 NOTE — Telephone Encounter (Signed)
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 Needs it by Jan 8th.

## 2015-01-17 NOTE — Telephone Encounter (Signed)
Needs to go via primary MD

## 2015-01-18 NOTE — Telephone Encounter (Signed)
LVM that he needs to go to PCP for medical clearance per Dr Benay Spice

## 2015-01-22 NOTE — Telephone Encounter (Signed)
Called pt to inform him letter is ready for pick up. Will be left at front desk.

## 2015-01-22 NOTE — Telephone Encounter (Signed)
Call from pt reporting he has had his regular check up with PCP and submitted that info. Since he has a cancer diagnosis on file employer requires oncology clearance as well. Reviewed with Dr. Benay Spice. Will provide letter stating cancer is in remission.

## 2015-01-24 ENCOUNTER — Telehealth: Payer: Self-pay | Admitting: *Deleted

## 2015-01-24 NOTE — Telephone Encounter (Signed)
TC from patient this am stating he would be coming in today to pick up his work related letter. Advised him it is at the front desk @ the [facility].   He voiced understanding.

## 2015-06-04 ENCOUNTER — Ambulatory Visit (HOSPITAL_BASED_OUTPATIENT_CLINIC_OR_DEPARTMENT_OTHER): Payer: Medicare Other | Admitting: Oncology

## 2015-06-04 ENCOUNTER — Telehealth: Payer: Self-pay | Admitting: Oncology

## 2015-06-04 ENCOUNTER — Other Ambulatory Visit: Payer: Medicare Other

## 2015-06-04 VITALS — BP 140/93 | HR 77 | Temp 98.0°F | Resp 18 | Ht 77.5 in | Wt 206.2 lb

## 2015-06-04 DIAGNOSIS — R351 Nocturia: Secondary | ICD-10-CM | POA: Diagnosis not present

## 2015-06-04 DIAGNOSIS — C187 Malignant neoplasm of sigmoid colon: Secondary | ICD-10-CM | POA: Diagnosis not present

## 2015-06-04 DIAGNOSIS — C189 Malignant neoplasm of colon, unspecified: Secondary | ICD-10-CM

## 2015-06-04 NOTE — Telephone Encounter (Signed)
left msg regarding urology referral.. per dr Benay Spice the pt is asked to call his insurance company to find a provider under his plan.. The pt is to advised to call back if he has questions/when he finds a provider

## 2015-06-04 NOTE — Progress Notes (Signed)
  [place] OFFICE PROGRESS NOTE   Diagnosis: Colon cancer  INTERVAL HISTORY:   Patrick Woodward returns as scheduled. He feels well. He continues to have mild numbness in the fingertips. This does not interfere with activity. He feels his prostate may be "swollen ". He has nocturia. He reports he underwent a "prostate massage "in the past.  Objective:  Vital signs in last 24 hours:  Blood pressure 140/93, pulse 77, temperature 98 F (36.7 C), temperature source Oral, resp. rate 18, height 6' 5.5" (1.969 m), weight 206 lb 3.2 oz (93.532 kg).    HEENT:  Neck without mass Lymphatics:  No cervical, supra-clavicular, axillary, or inguinal nodes Resp:  Lungs clear bilaterally Cardio:  Regular rate and rhythm GI:  No hepatomegaly, no mass Vascular:  No leg edema   Lab Results:   CEA pending   Medications: I have reviewed the patient's current medications.  Assessment/Plan: 1. 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 Status post cycle #1 adjuvant FOLFOX chemotherapy 12/01/2010. He completed cycle 2 beginning 12/15/2010. He completed cycle 3 beginning 01/05/2011 with Neulasta support. He completed cycle 11 on 05/04/2011. He completed the 12th and final cycle on 05/18/2011.   Negative colonoscopy 09/08/2011, benign polypoid lesion in the descending colon. Colonoscopy 08/28/2013 with multiple benign polyps removed.  Restaging CT scans 10/26/2012 with no evidence of metastatic disease.  Restaging CT scans 06/24/2013 with no evidence of recurrent disease 2. History of a bowel obstruction secondary to the primary colon mass. 3. History of abdominal pain/constipation secondary to the descending/sigmoid colon mass. 4. History of weight loss. Improved. 5. Status post Port-A-Cath placement 11/27/2010. Removed 12/02/2011. 6. History of neutropenia  secondary to chemotherapy. Cycle 3 FOLFOX was held 12/29/2010 due to neutropenia. He received Neulasta with cycle #3 FOLFOX chemotherapy on 01/05/2011. He did not receive Neulasta following cycle 4. Cycle 5 was held for one week due to neutropenia. He received Neulasta with subsequent treatments. Neulasta was held beginning with cycle 10 since the oxaliplatin was discontinued. 7. Thrombocytopenia secondary to chemotherapy. Oxaliplatin was dose reduced beginning with cycle 7. 8. Back pain following cycle 3 FOLFOX. Likely related to Neulasta. 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 Improved.  10. Delayed nausea following cycle 7 of FOLFOX. Aloxi was added to the antiemetic regimen beginning with cycle 8 FOLFOX. He also began prophylactic Decadron on days 2 and 3 following chemotherapy with cycle 8. He did not have nausea following cycle 8 or cycle 9 FOLFOX 11. Colostomy reversal 12/02/2011. 12. Mild elevation of the creatinine 10/26/2012. Repeat value on 10/28/2012 stable at 1.5. He is followed by nephrology   Disposition:   Mr. Crilley remains in clinical remission from colon cancer. We will follow-up on the CEA from today. He will return for an office visit and CEA in 6 months. He continues surveillance colonoscopies with Dr. Michail Sermon.  We will make a urology referral to evaluate his prostate symptoms.  Betsy Coder, MD  06/04/2015  10:27 AM

## 2015-06-05 LAB — CEA (PARALLEL TESTING): CEA: 2.2 ng/mL

## 2015-06-05 LAB — CEA: CEA1: 4.2 ng/mL (ref 0.0–4.7)

## 2015-06-07 ENCOUNTER — Telehealth: Payer: Self-pay | Admitting: *Deleted

## 2015-06-07 NOTE — Telephone Encounter (Signed)
-----   Message from Ladell Pier, MD sent at 06/06/2015  6:04 PM EDT ----- Please call patient, cea is normal

## 2015-06-07 NOTE — Telephone Encounter (Signed)
Per Dr. Benay Spice, message left on pt.'s private cell phone notifying him that cea is normal.  Pt instructed to call [facility] on Monday if he has any questions or concerns.

## 2015-11-29 ENCOUNTER — Telehealth: Payer: Self-pay | Admitting: *Deleted

## 2015-11-29 NOTE — Telephone Encounter (Signed)
Patient called and left message requesting call back for his appt.date and times.  Spoke with patient and gave him appt times for 10/24.  He stated he was on a bus trip but that he would try and get back.

## 2015-12-03 ENCOUNTER — Other Ambulatory Visit (HOSPITAL_BASED_OUTPATIENT_CLINIC_OR_DEPARTMENT_OTHER): Payer: Medicare Other

## 2015-12-03 ENCOUNTER — Ambulatory Visit (HOSPITAL_BASED_OUTPATIENT_CLINIC_OR_DEPARTMENT_OTHER): Payer: Medicare Other | Admitting: Nurse Practitioner

## 2015-12-03 VITALS — BP 143/79 | HR 66 | Temp 98.1°F | Resp 18 | Ht 77.5 in | Wt 204.8 lb

## 2015-12-03 DIAGNOSIS — Z85038 Personal history of other malignant neoplasm of large intestine: Secondary | ICD-10-CM | POA: Diagnosis not present

## 2015-12-03 DIAGNOSIS — C187 Malignant neoplasm of sigmoid colon: Secondary | ICD-10-CM

## 2015-12-03 DIAGNOSIS — C186 Malignant neoplasm of descending colon: Secondary | ICD-10-CM

## 2015-12-03 LAB — CEA (IN HOUSE-CHCC): CEA (CHCC-IN HOUSE): 3.75 ng/mL (ref 0.00–5.00)

## 2015-12-03 NOTE — Progress Notes (Signed)
  [place] OFFICE PROGRESS NOTE   Diagnosis:  Colon cancer  INTERVAL HISTORY:   Patrick Woodward returns as scheduled. He feels well. No change in bowel habits. No bloody or black stools. No abdominal pain.  Objective:  Vital signs in last 24 hours:  Blood pressure (!) 143/79, pulse 66, temperature 98.1 F (36.7 C), resp. rate 18, height 6' 5.5" (1.969 m), weight 204 lb 12.8 oz (92.9 kg), SpO2 100 %.    HEENT: No neck mass. Lymphatics: No palpable cervical, supraclavicular, axillary or inguinal lymph nodes. Resp: Lungs clear bilaterally. Cardio: Regular rate and rhythm. GI: Abdomen soft and nontender. No hepatomegaly. No mass. Vascular: No leg edema.  Lab Results:  Lab Results  Component Value Date   WBC 5.2 10/25/2013   HGB 14.7 10/25/2013   HCT 45.8 10/25/2013   MCV 85.6 10/25/2013   PLT 146 10/25/2013   NEUTROABS 2.7 10/25/2013    Imaging:  No results found.  Medications: I have reviewed the patient's current medications.  Assessment/Plan: 1. 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 Status post cycle #1 adjuvant FOLFOX chemotherapy 12/01/2010. He completed cycle 2 beginning 12/15/2010. He completed cycle 3 beginning 01/05/2011 with Neulasta support. He completed cycle 11 on 05/04/2011. He completed the 12th and final cycle on 05/18/2011.   Negative colonoscopy 09/08/2011, benign polypoid lesion in the descending colon. Colonoscopy 08/28/2013 with multiple benign polyps removed.  Restaging CT scans 10/26/2012 with no evidence of metastatic disease.  Restaging CT scans 06/24/2013 with no evidence of recurrent disease 2. History of a bowel obstruction secondary to the primary colon mass. 3. History of abdominal pain/constipation secondary to the descending/sigmoid colon mass. 4. History of weight loss.  Improved. 5. Status post Port-A-Cath placement 11/27/2010. Removed 12/02/2011. 6. History of neutropenia secondary to chemotherapy. Cycle 3 FOLFOX was held 12/29/2010 due to neutropenia. He received Neulasta with cycle #3 FOLFOX chemotherapy on 01/05/2011. He did not receive Neulasta following cycle 4. Cycle 5 was held for one week due to neutropenia. He received Neulasta with subsequent treatments. Neulasta was held beginning with cycle 10 since the oxaliplatin was discontinued. 7. Thrombocytopenia secondary to chemotherapy. Oxaliplatin was dose reduced beginning with cycle 7. 8. Back pain following cycle 3 FOLFOX. Likely related to Neulasta. 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 Improved.  10. Delayed nausea following cycle 7 of FOLFOX. Aloxi was added to the antiemetic regimen beginning with cycle 8 FOLFOX. He also began prophylactic Decadron on days 2 and 3 following chemotherapy with cycle 8. He did not have nausea following cycle 8 or cycle 9 FOLFOX 11. Colostomy reversal 12/02/2011. 12. Mild elevation of the creatinine 10/26/2012. Repeat value on 10/28/2012 stable at 1.5. He is followed by nephrology   Disposition: Patrick Woodward remains in clinical remission from colon cancer. We will follow-up on the CEA from today.  He reports he is due for a colonoscopy. I made a referral to Patrick Woodward.  He will return for a follow-up visit and CEA in 6 months. He will contact the office in the interim with any problems.    Ned Card ANP/GNP-BC   12/03/2015  8:52 AM

## 2015-12-04 LAB — CEA: CEA: 3.2 ng/mL (ref 0.0–4.7)

## 2015-12-05 ENCOUNTER — Telehealth: Payer: Self-pay

## 2015-12-05 NOTE — Telephone Encounter (Signed)
Called and left message for pt to call back regarding lab results. 

## 2015-12-05 NOTE — Telephone Encounter (Signed)
-----   Message from Lisa K Thomas, NP sent at 12/05/2015  8:52 AM EDT ----- Please let him know CEA is normal. 

## 2015-12-05 NOTE — Telephone Encounter (Signed)
Called and informed pt of normal cea results, pt verbalized understanding and denies any questions or concerns at this time.

## 2016-02-21 ENCOUNTER — Encounter: Payer: Self-pay | Admitting: *Deleted

## 2016-02-21 ENCOUNTER — Telehealth: Payer: Self-pay | Admitting: *Deleted

## 2016-02-21 NOTE — Telephone Encounter (Signed)
Message from pt requesting letter for work stating he is cancer free. Fax to [phone]. Letter faxed to number provided. Pt requested copy to be left for pick up as well. Same done.

## 2016-02-25 ENCOUNTER — Telehealth: Payer: Self-pay | Admitting: Oncology

## 2016-02-25 NOTE — Telephone Encounter (Signed)
Mailed records to [facility] risk adjustment °

## 2016-06-02 ENCOUNTER — Other Ambulatory Visit (HOSPITAL_BASED_OUTPATIENT_CLINIC_OR_DEPARTMENT_OTHER): Payer: Medicare Other

## 2016-06-02 ENCOUNTER — Ambulatory Visit (HOSPITAL_BASED_OUTPATIENT_CLINIC_OR_DEPARTMENT_OTHER): Payer: Medicare Other | Admitting: Oncology

## 2016-06-02 VITALS — BP 114/65 | HR 77 | Temp 98.4°F | Resp 18 | Ht 77.5 in | Wt 207.3 lb

## 2016-06-02 DIAGNOSIS — C186 Malignant neoplasm of descending colon: Secondary | ICD-10-CM

## 2016-06-02 DIAGNOSIS — Z85038 Personal history of other malignant neoplasm of large intestine: Secondary | ICD-10-CM

## 2016-06-02 LAB — CEA (IN HOUSE-CHCC): CEA (CHCC-IN HOUSE): 4.51 ng/mL (ref 0.00–5.00)

## 2016-06-02 NOTE — Progress Notes (Signed)
  [place] OFFICE PROGRESS NOTE   Diagnosis: Colon cancer  INTERVAL HISTORY:   Patrick Woodward returns as scheduled. He feels well. No difficulty with bowel function. Good appetite. He has persistent mild numbness in the fingers and toes. This does not interfere with activity.  Objective:  Vital signs in last 24 hours:  Blood pressure 114/65, pulse 77, temperature 98.4 F (36.9 C), temperature source Oral, resp. rate 18, height 6' 5.5" (1.969 m), weight 207 lb 4.8 oz (94 kg), SpO2 100 %.    HEENT: Neck without mass Lymphatics: Pea-sized left scalene node. No other cervical, supraclavicular, axillary, or inguinal nodes Resp: Lungs clear bilaterally Cardio: Regular rate and rhythm GI: No hepatosplenomegaly, no mass, nontender Vascular: No leg edema  Lab Results: CEA-pending Medications: I have reviewed the patient's current medications.  Assessment/Plan: 1. 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 Status post cycle #1 adjuvant FOLFOX chemotherapy 12/01/2010. He completed cycle 2 beginning 12/15/2010. He completed cycle 3 beginning 01/05/2011 with Neulasta support. He completed cycle 11 on 05/04/2011. He completed the 12th and final cycle on 05/18/2011.   Negative colonoscopy 09/08/2011, benign polypoid lesion in the descending colon. Colonoscopy 08/28/2013 with multiple benign polyps removed.  Restaging CT scans 10/26/2012 with no evidence of metastatic disease.  Restaging CT scans 06/24/2013 with no evidence of recurrent disease 2. History of a bowel obstruction secondary to the primary colon mass. 3. History of abdominal pain/constipation secondary to the descending/sigmoid colon mass. 4. History of weight loss. Improved. 5. Status post Port-A-Cath placement 11/27/2010. Removed 12/02/2011. 6. History of neutropenia secondary to  chemotherapy. Cycle 3 FOLFOX was held 12/29/2010 due to neutropenia. He received Neulasta with cycle #3 FOLFOX chemotherapy on 01/05/2011. He did not receive Neulasta following cycle 4. Cycle 5 was held for one week due to neutropenia. He received Neulasta with subsequent treatments. Neulasta was held beginning with cycle 10 since the oxaliplatin was discontinued. 7. Thrombocytopenia secondary to chemotherapy. Oxaliplatin was dose reduced beginning with cycle 7. 8. Back pain following cycle 3 FOLFOX. Likely related to Neulasta. 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 Improved.  10. Delayed nausea following cycle 7 of FOLFOX. Aloxi was added to the antiemetic regimen beginning with cycle 8 FOLFOX. He also began prophylactic Decadron on days 2 and 3 following chemotherapy with cycle 8. He did not have nausea following cycle 8 or cycle 9 FOLFOX 11. Colostomy reversal 12/02/2011. 12. Mild elevation of the creatinine 10/26/2012. Repeat value on 10/28/2012 stable at 1.5. He is followed by nephrology     Disposition:  He remains in clinical remission from colon cancer. He would like to [facility] follow[facility] at the [facility]. He will return for an office visit and CEA in one year. A tubular adenoma was removed by Dr. Michail Sermon in 2015. We will refer him to schedule a follow-up colonoscopy.  15 minutes were spent with the patient today. The majority of the time was used for counseling and coordination of care.  Betsy Coder, MD  06/02/2016  12:38 PM

## 2016-06-03 ENCOUNTER — Telehealth: Payer: Self-pay | Admitting: *Deleted

## 2016-06-03 LAB — CEA: CEA: 3.9 ng/mL (ref 0.0–4.7)

## 2016-06-03 NOTE — Telephone Encounter (Signed)
Left message with patient to call [place] back tomorrow for lab results.

## 2016-06-03 NOTE — Telephone Encounter (Signed)
-----   Message from Ladell Pier, MD sent at 06/03/2016  3:37 PM EDT ----- Please call patient, cea is normal, in high normal range, repeat 6 months

## 2016-06-05 ENCOUNTER — Telehealth: Payer: Self-pay | Admitting: *Deleted

## 2016-06-05 NOTE — Telephone Encounter (Signed)
Spoke with patient and let him know that Dr. Benay Spice wanted him to know that CEA is in normal range - but high normal.  Dr. Benay Spice will check it again in 6 months.  As of now there is no lab appt.  Spoke with Tiffany LPN and they will schedule CEA and lab appt for 6 months.  Patient knows to expect call to check lab.

## 2016-06-08 ENCOUNTER — Telehealth: Payer: Self-pay | Admitting: Oncology

## 2016-06-08 NOTE — Telephone Encounter (Signed)
Called patient to inform patient of next scheduled appointments. LVM

## 2016-07-07 ENCOUNTER — Telehealth: Payer: Self-pay | Admitting: *Deleted

## 2016-07-07 NOTE — Telephone Encounter (Signed)
Patient missed call from [facility].  This nurse called patient who reports scheduled to see  Dr. Michail Sermon Monday, July 9th, arrive at 9:15 for colonoscopy to be performed at 10:00 am.  Can Dr. Benay Spice do anything about the co-pay of $395.00?  I'm going to call my insurance."  Reports he did change insurance from [place] to [facility].   Asked patient to call if new referral needed.  This nurse experience with other [place] is [facility] is out if network with [facility].

## 2016-07-07 NOTE — Telephone Encounter (Signed)
Message left with pt to call [place] back to confirm that he has appt for colonoscopy per referral of Dr. Benay Spice.

## 2016-11-30 ENCOUNTER — Telehealth: Payer: Self-pay | Admitting: Nurse Practitioner

## 2016-11-30 NOTE — Telephone Encounter (Signed)
Patient needed to reschedule due to conflict in date

## 2016-12-02 ENCOUNTER — Other Ambulatory Visit: Payer: Medicare Other

## 2016-12-07 ENCOUNTER — Other Ambulatory Visit (HOSPITAL_BASED_OUTPATIENT_CLINIC_OR_DEPARTMENT_OTHER): Payer: Medicare Other

## 2016-12-07 DIAGNOSIS — C186 Malignant neoplasm of descending colon: Secondary | ICD-10-CM

## 2016-12-07 DIAGNOSIS — Z85038 Personal history of other malignant neoplasm of large intestine: Secondary | ICD-10-CM

## 2016-12-07 LAB — CEA (IN HOUSE-CHCC): CEA (CHCC-In House): 6.29 ng/mL — ABNORMAL HIGH (ref 0.00–5.00)

## 2016-12-11 ENCOUNTER — Telehealth: Payer: Self-pay | Admitting: *Deleted

## 2016-12-11 ENCOUNTER — Telehealth: Payer: Self-pay | Admitting: Nurse Practitioner

## 2016-12-11 ENCOUNTER — Other Ambulatory Visit: Payer: Self-pay | Admitting: Nurse Practitioner

## 2016-12-11 DIAGNOSIS — C189 Malignant neoplasm of colon, unspecified: Secondary | ICD-10-CM

## 2016-12-11 NOTE — Telephone Encounter (Signed)
Left voicemail for patient regarding appt added per 11/2 sch msg.

## 2016-12-11 NOTE — Telephone Encounter (Signed)
Telephone call to patient left message to discuss lab results below.

## 2016-12-11 NOTE — Telephone Encounter (Signed)
-----   Message from Owens Shark, NP sent at 12/11/2016 10:07 AM EDT ----- Please call him. CEA is mildly elevated. Is he smoking?  Repeat CEA in 4-6 weeks, if higher at that time will need CTs. Please confirm date of last colonoscopy.

## 2017-01-07 ENCOUNTER — Other Ambulatory Visit (HOSPITAL_BASED_OUTPATIENT_CLINIC_OR_DEPARTMENT_OTHER): Payer: Medicare Other

## 2017-01-07 DIAGNOSIS — C189 Malignant neoplasm of colon, unspecified: Secondary | ICD-10-CM

## 2017-01-07 DIAGNOSIS — Z85038 Personal history of other malignant neoplasm of large intestine: Secondary | ICD-10-CM | POA: Diagnosis not present

## 2017-01-07 LAB — CEA (IN HOUSE-CHCC): CEA (CHCC-In House): 6.96 ng/mL — ABNORMAL HIGH (ref 0.00–5.00)

## 2017-01-13 ENCOUNTER — Telehealth: Payer: Self-pay | Admitting: *Deleted

## 2017-01-13 ENCOUNTER — Telehealth: Payer: Self-pay | Admitting: Oncology

## 2017-01-13 ENCOUNTER — Telehealth: Payer: Self-pay | Admitting: Emergency Medicine

## 2017-01-13 ENCOUNTER — Other Ambulatory Visit: Payer: Self-pay | Admitting: Nurse Practitioner

## 2017-01-13 DIAGNOSIS — C189 Malignant neoplasm of colon, unspecified: Secondary | ICD-10-CM

## 2017-01-13 DIAGNOSIS — C186 Malignant neoplasm of descending colon: Secondary | ICD-10-CM

## 2017-01-13 NOTE — Telephone Encounter (Addendum)
Left VM with patient regarding this note. Staff message sent to [place] for prior auth for scan.   ----- Message from Owens Shark, NP sent at 01/13/2017 11:25 AM EST ----- Please let him know the CEA remains mildly elevated.  Dr. Benay Spice would like for him to have CT scans.  I will enter the orders.

## 2017-01-13 NOTE — Telephone Encounter (Signed)
Contacted patient regarding lab work to be done in 1 month BMET and CEA. Patient verbalized understanding.

## 2017-01-13 NOTE — Telephone Encounter (Signed)
Patient returned call. He states that he does not want to have dye again because of the damage it caused his kidneys,

## 2017-01-13 NOTE — Telephone Encounter (Signed)
Clarifying last note. Patient has been taking Nugenix.

## 2017-01-13 NOTE — Telephone Encounter (Signed)
Called patient to inform him of his appt on 01/18/2017 for labs. Patient states he does not smoke and has not smoked for 30 years. He reports that he started taking Nudenix (OTC male testosterone enhancer) for the past six months.

## 2017-01-13 NOTE — Telephone Encounter (Signed)
Left message for patient regarding appt that was added per 12/5 sch msg

## 2017-01-18 ENCOUNTER — Other Ambulatory Visit: Payer: Medicare Other

## 2017-02-19 ENCOUNTER — Other Ambulatory Visit: Payer: Medicare Other

## 2017-02-19 ENCOUNTER — Telehealth: Payer: Self-pay | Admitting: Oncology

## 2017-02-19 NOTE — Telephone Encounter (Signed)
Scheduled appt per 1/10 sch msg - left voicemail for patient regarding appts.

## 2017-02-23 ENCOUNTER — Inpatient Hospital Stay: Payer: Medicare Other | Attending: Oncology

## 2017-02-23 DIAGNOSIS — Z85038 Personal history of other malignant neoplasm of large intestine: Secondary | ICD-10-CM | POA: Insufficient documentation

## 2017-02-23 DIAGNOSIS — C186 Malignant neoplasm of descending colon: Secondary | ICD-10-CM

## 2017-02-23 LAB — BASIC METABOLIC PANEL
Anion gap: 12 — ABNORMAL HIGH (ref 3–11)
BUN: 12 mg/dL (ref 7–26)
CALCIUM: 8.9 mg/dL (ref 8.4–10.4)
CO2: 21 mmol/L — ABNORMAL LOW (ref 22–29)
CREATININE: 1.5 mg/dL — AB (ref 0.70–1.30)
Chloride: 106 mmol/L (ref 98–109)
GFR calc Af Amer: 51 mL/min — ABNORMAL LOW (ref >60)
GFR calc non Af Amer: 44 mL/min — ABNORMAL LOW (ref >60)
GLUCOSE: 128 mg/dL (ref 70–140)
Potassium: 3.9 mmol/L (ref 3.5–5.1)
Sodium: 139 mmol/L (ref 136–145)

## 2017-02-23 LAB — CEA (IN HOUSE-CHCC): CEA (CHCC-In House): 5.76 ng/mL — ABNORMAL HIGH (ref 0.00–5.00)

## 2017-02-24 ENCOUNTER — Telehealth: Payer: Self-pay | Admitting: Emergency Medicine

## 2017-02-24 NOTE — Telephone Encounter (Addendum)
Pt verbalized understanding of this note. Pt requesting medical records be sent to social security office. Informed patient he must come sign for his medical records and take them to the social security office and they can contact us for any more information.   ----- Message from Owens Shark, NP sent at 02/24/2017 10:01 AM EST ----- Please let him know the CEA is better, but remains mildly elevated.  Follow-up as scheduled.

## 2017-06-07 ENCOUNTER — Inpatient Hospital Stay: Payer: Medicare Other

## 2017-06-07 ENCOUNTER — Inpatient Hospital Stay: Payer: Medicare Other | Attending: Nurse Practitioner | Admitting: Nurse Practitioner

## 2017-06-07 ENCOUNTER — Encounter: Payer: Self-pay | Admitting: Nurse Practitioner

## 2017-06-07 ENCOUNTER — Telehealth: Payer: Self-pay

## 2017-06-07 VITALS — BP 146/72 | HR 72 | Temp 98.2°F | Resp 18 | Ht 77.5 in | Wt 214.6 lb

## 2017-06-07 DIAGNOSIS — Z85038 Personal history of other malignant neoplasm of large intestine: Secondary | ICD-10-CM | POA: Diagnosis not present

## 2017-06-07 DIAGNOSIS — C189 Malignant neoplasm of colon, unspecified: Secondary | ICD-10-CM

## 2017-06-07 LAB — CEA (IN HOUSE-CHCC): CEA (CHCC-In House): 3.52 ng/mL (ref 0.00–5.00)

## 2017-06-07 LAB — COMPREHENSIVE METABOLIC PANEL
ALT: 37 U/L (ref 0–55)
AST: 23 U/L (ref 5–34)
Albumin: 4 g/dL (ref 3.5–5.0)
Alkaline Phosphatase: 73 U/L (ref 40–150)
Anion gap: 10 (ref 3–11)
BUN: 12 mg/dL (ref 7–26)
CHLORIDE: 106 mmol/L (ref 98–109)
CO2: 24 mmol/L (ref 22–29)
CREATININE: 1.51 mg/dL — AB (ref 0.70–1.30)
Calcium: 9.1 mg/dL (ref 8.4–10.4)
GFR calc non Af Amer: 44 mL/min — ABNORMAL LOW (ref >60)
GFR, EST AFRICAN AMERICAN: 51 mL/min — AB (ref >60)
Glucose, Bld: 110 mg/dL (ref 70–140)
Potassium: 4.1 mmol/L (ref 3.5–5.1)
SODIUM: 140 mmol/L (ref 136–145)
Total Bilirubin: 0.7 mg/dL (ref 0.2–1.2)
Total Protein: 7.1 g/dL (ref 6.4–8.3)

## 2017-06-07 NOTE — Progress Notes (Signed)
[place] OFFICE PROGRESS NOTE   Diagnosis: Colon cancer  INTERVAL HISTORY:   Mr. Nanna returns as scheduled.  He feels well.  He reports a good appetite.  No change in bowel habits.  No bloody or black stools.  He notes intermittent soreness at the left abdomen.  He thinks he may have "pulled a muscle".  Objective:  Vital signs in last 24 hours:  Blood pressure (!) 146/72, pulse 72, temperature 98.2 F (36.8 C), temperature source Oral, resp. rate 18, height 6' 5.5" (1.969 m), weight 214 lb 9.6 oz (97.3 kg), SpO2 100 %.    HEENT: Neck without mass. Lymphatics: No palpable cervical, supraclavicular, axillary or inguinal lymph nodes. Resp: Lungs clear bilaterally. Cardio: Regular rate and rhythm. GI: Abdomen soft and nontender.  No hepatomegaly.  No mass.  No apparent ascites. Vascular: No leg edema.   Lab Results:  Lab Results  Component Value Date   WBC 5.2 10/25/2013   HGB 14.7 10/25/2013   HCT 45.8 10/25/2013   MCV 85.6 10/25/2013   PLT 146 10/25/2013   NEUTROABS 2.7 10/25/2013    Imaging:  No results found.  Medications: I have reviewed the patient's current medications.  Assessment/Plan: 1. 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 Status post cycle #1 adjuvant FOLFOX chemotherapy 12/01/2010. He completed cycle 2 beginning 12/15/2010. He completed cycle 3 beginning 01/05/2011 with Neulasta support. He completed cycle 11 on 05/04/2011. He completed the 12th and final cycle on 05/18/2011.   Negative colonoscopy 09/08/2011, benign polypoid lesion in the descending colon. Colonoscopy 08/28/2013 with multiple benign polyps removed.  Restaging CT scans 10/26/2012 with no evidence of metastatic disease.  Restaging CT scans 06/24/2013 with no evidence of recurrent disease  Surveillance colonoscopy 08/17/2016- 16 mm  polyp in the cecum; 10 mm polyp in the ascending colon; 2 mm polyp in the ascending colon; 5 mm polyp in the transverse colon.  Pathology on the cecal polyp-tubular adenoma, 1.6 cm; pathology on polyps descending/transverse colon-tubular adenomas.  Mildly elevated CEA 12/07/2016, stable mild elevation 01/07/2017, improved but continued mild elevation 02/23/2017; CT scans recommended, patient declined due to renal dysfunction 2. History of a bowel obstruction secondary to the primary colon mass. 3. History of abdominal pain/constipation secondary to the descending/sigmoid colon mass. 4. History of weight loss. Improved. 5. Status post Port-A-Cath placement 11/27/2010. Removed 12/02/2011. 6. History of neutropenia secondary to chemotherapy. Cycle 3 FOLFOX was held 12/29/2010 due to neutropenia. He received Neulasta with cycle #3 FOLFOX chemotherapy on 01/05/2011. He did not receive Neulasta following cycle 4. Cycle 5 was held for one week due to neutropenia. He received Neulasta with subsequent treatments. Neulasta was held beginning with cycle 10 since the oxaliplatin was discontinued. 7. Thrombocytopenia secondary to chemotherapy. Oxaliplatin was dose reduced beginning with cycle 7. 8. Back pain following cycle 3 FOLFOX. Likely related to Neulasta. 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 Improved.  10. Delayed nausea following cycle 7 of FOLFOX. Aloxi was added to the antiemetic regimen beginning with cycle 8 FOLFOX. He also began prophylactic Decadron on days 2 and 3 following chemotherapy with cycle 8. He did not have nausea following cycle 8 or cycle 9 FOLFOX 11. Colostomy reversal 12/02/2011. 12. Mild elevation of the creatinine 10/26/2012. Repeat value on 10/28/2012 stable at 1.5. He is followed by nephrology     Disposition: Mr. Bias remains in clinical  remission from colon cancer.  CEA has been mildly elevated recently.  We will  follow-up on the value from today.  If the CEA remains elevated the plan is to refer for a PET scan.  He will return for lab and follow-up in 1 year.  This appointment will be adjusted accordingly pending the CEA from today.  Plan reviewed with Dr. Benay Spice.  25 minutes were spent face-to-face at today's visit with the majority of that time involved in counseling/coordination of care.    Patrick Woodward ANP/GNP-BC   06/07/2017  10:05 AM

## 2017-06-07 NOTE — Telephone Encounter (Signed)
Priinted avs and calender of upcoming appointment. Per 4/29 los

## 2017-06-10 ENCOUNTER — Telehealth: Payer: Self-pay | Admitting: Emergency Medicine

## 2017-06-10 ENCOUNTER — Telehealth: Payer: Self-pay | Admitting: Oncology

## 2017-06-10 NOTE — Telephone Encounter (Addendum)
Left VM for pt to call back regarding this note.   ----- Message from Owens Shark, NP sent at 06/08/2017  4:34 PM EDT ----- Please let him know the CEA is normal.  Follow-up as scheduled.

## 2017-10-20 DIAGNOSIS — R7309 Other abnormal glucose: Secondary | ICD-10-CM

## 2017-10-20 DIAGNOSIS — Z23 Encounter for immunization: Secondary | ICD-10-CM | POA: Diagnosis not present

## 2017-10-20 DIAGNOSIS — N183 Chronic kidney disease, stage 3 (moderate): Secondary | ICD-10-CM

## 2017-10-20 DIAGNOSIS — E782 Mixed hyperlipidemia: Secondary | ICD-10-CM | POA: Diagnosis not present

## 2017-10-20 DIAGNOSIS — I129 Hypertensive chronic kidney disease with stage 1 through stage 4 chronic kidney disease, or unspecified chronic kidney disease: Secondary | ICD-10-CM | POA: Diagnosis not present

## 2017-11-12 ENCOUNTER — Telehealth: Payer: Self-pay | Admitting: *Deleted

## 2017-11-12 NOTE — Telephone Encounter (Signed)
DOT form faxed to patient's employer stating only patient is in remission from colon cancer. Our office does not provide DOT Examinations. Patient called and message left that form was faxed. Sent copy to scan.

## 2017-11-25 ENCOUNTER — Other Ambulatory Visit: Payer: Self-pay | Admitting: Internal Medicine

## 2018-03-01 ENCOUNTER — Other Ambulatory Visit: Payer: Self-pay | Admitting: Internal Medicine

## 2018-03-21 ENCOUNTER — Other Ambulatory Visit: Payer: Self-pay | Admitting: Internal Medicine

## 2018-04-12 ENCOUNTER — Other Ambulatory Visit: Payer: Self-pay | Admitting: Nurse Practitioner

## 2018-04-25 ENCOUNTER — Encounter: Payer: Self-pay | Admitting: Nurse Practitioner

## 2018-04-25 ENCOUNTER — Ambulatory Visit (INDEPENDENT_AMBULATORY_CARE_PROVIDER_SITE_OTHER): Payer: Medicare Other | Admitting: Nurse Practitioner

## 2018-04-25 ENCOUNTER — Other Ambulatory Visit: Payer: Self-pay

## 2018-04-25 VITALS — BP 142/80 | HR 66 | Temp 98.7°F | Ht 77.5 in | Wt 219.4 lb

## 2018-04-25 DIAGNOSIS — I10 Essential (primary) hypertension: Secondary | ICD-10-CM | POA: Diagnosis not present

## 2018-04-25 DIAGNOSIS — E782 Mixed hyperlipidemia: Secondary | ICD-10-CM | POA: Diagnosis not present

## 2018-04-25 DIAGNOSIS — N529 Male erectile dysfunction, unspecified: Secondary | ICD-10-CM

## 2018-04-25 DIAGNOSIS — Z85038 Personal history of other malignant neoplasm of large intestine: Secondary | ICD-10-CM

## 2018-04-25 MED ORDER — SILDENAFIL CITRATE 20 MG PO TABS: 20.0000 mg | ORAL_TABLET | Freq: Every day | ORAL | 2 refills | Status: DC

## 2018-04-25 MED ORDER — METOPROLOL SUCCINATE ER 50 MG PO TB24: 50.0000 mg | ORAL_TABLET | Freq: Every day | ORAL | 1 refills | Status: DC

## 2018-04-25 MED ORDER — ATORVASTATIN CALCIUM 20 MG PO TABS: 20.0000 mg | ORAL_TABLET | Freq: Every day | ORAL | 1 refills | Status: DC

## 2018-04-25 NOTE — Progress Notes (Signed)
Subjective:     Patient ID: Patrick Woodward , male    DOB: 02-16-1940 , 78 y.o.   MRN: [id number]   Chief Complaint  Patient presents with  . Hypertension    follow up htn     HPI  Hyperlipidemia - tolerating medications well.  Tries to avoid fried and fatty foods.   Hypertension  This is a chronic problem. The current episode started more than 1 year ago. The problem is uncontrolled. Pertinent negatives include no anxiety, chest pain, headaches or palpitations. There are no associated agents to hypertension. Risk factors for coronary artery disease include male gender and sedentary lifestyle. The current treatment provides no improvement. There are no compliance problems.  There is no history of angina or kidney disease.     Past Medical History:  Diagnosis Date  . Colon cancer ([place]) 10/2010   Stage III-mod differentiated adeno  . GERD (gastroesophageal reflux disease)   . Hypercholesteremia   . Hypertension   . 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 Cancer Mother        colon  . Cancer Father        prostate     Current Outpatient Medications:  .  aspirin 325 MG EC tablet, Take 325 mg by mouth daily., Disp: , Rfl:  .  atorvastatin (LIPITOR) 20 MG tablet, Take 20 mg by mouth daily., Disp: , Rfl:  .  cholecalciferol (VITAMIN D) 1000 UNITS tablet, Take 2,000 Units by mouth daily., Disp: , Rfl:  .  metoprolol (TOPROL-XL) 50 MG 24 hr tablet, Take 50 mg by mouth daily. , Disp: , Rfl:  .  Multiple Vitamin (MULTIVITAMIN PO), Take 1 tablet by mouth daily. , Disp: , Rfl:  .  sildenafil (REVATIO) 20 MG tablet, Take 1 tablet by mouth once daily, Disp: 30 tablet, Rfl: 0 .  vitamin E (VITAMIN E) 400 UNIT capsule, Take 400 Units by mouth daily.  , Disp: , Rfl:    Allergies  Allergen Reactions  . Procardia [Nifedipine] Other (See Comments)    irratic heart rate      Review of Systems  Constitutional:  Negative.  Negative for fatigue.  Respiratory: Negative.  Negative for cough.   Cardiovascular: Negative.  Negative for chest pain, palpitations and leg swelling.  Gastrointestinal: Negative.   Endocrine: Negative for polydipsia, polyphagia and polyuria.  Neurological: Negative for dizziness and headaches.     Today's Vitals   04/25/18 0843  BP: (!) 142/80  Pulse: 66  Temp: 98.7 F (37.1 C)  TempSrc: Oral  SpO2: 97%  Weight: 219 lb 6.4 oz (99.5 kg)  Height: 6' 5.5" (1.969 m)   Body mass index is 25.68 kg/m.   Objective:  Physical Exam Vitals signs reviewed.  Constitutional:      Appearance: Normal appearance.  Cardiovascular:     Rate and Rhythm: Normal rate and regular rhythm.     Pulses: Normal pulses.     Heart sounds: No murmur. No friction rub.  Pulmonary:     Effort: Pulmonary effort is normal. No respiratory distress.     Breath sounds: Normal breath sounds.  Skin:    General: Skin is warm and dry.     Capillary Refill: Capillary refill takes less than 2 seconds.  Neurological:     General: No focal deficit present.     Mental Status: He is alert and oriented  to person, place, and time.  Psychiatric:        Mood and Affect: Mood normal.        Behavior: Behavior normal.        Thought Content: Thought content normal.        Judgment: Judgment normal.         Assessment And Plan:     1. Essential hypertension . B/P is uncontrolled, denies skipping doses of medications. Encouraged to make sure he is drinking adequate amounts of water  . CMP ordered to check renal function.  . The importance of regular exercise and dietary modification was stressed to the patient.  - metoprolol succinate (TOPROL-XL) 50 MG 24 hr tablet; Take 1 tablet (50 mg total) by mouth daily.  Dispense: 90 tablet; Refill: 1 - CMP14 + Anion Gap  2. Mixed hyperlipidemia  Chronic, controlled  Continue with current medications - atorvastatin (LIPITOR) 20 MG tablet; Take 1 tablet (20  mg total) by mouth daily.  Dispense: 90 tablet; Refill: 1 - Lipid panel  3. Erectile dysfunction, unspecified erectile dysfunction type  Tolerating medications well. - sildenafil (REVATIO) 20 MG tablet; Take 1 tablet (20 mg total) by mouth daily.  Dispense: 30 tablet; Refill: 2  4. History of colon cancer  Currently in remission, next appt with Oncologist is in May 2020    [name], [place]

## 2018-04-26 LAB — CMP14 + ANION GAP
ALT: 34 IU/L (ref 0–44)
AST: 27 IU/L (ref 0–40)
Albumin/Globulin Ratio: 2 (ref 1.2–2.2)
Albumin: 4.6 g/dL (ref 3.7–4.7)
Alkaline Phosphatase: 78 IU/L (ref 39–117)
Anion Gap: 16 mmol/L (ref 10.0–18.0)
BUN/Creatinine Ratio: 9 — ABNORMAL LOW (ref 10–24)
BUN: 13 mg/dL (ref 8–27)
Bilirubin Total: 0.6 mg/dL (ref 0.0–1.2)
CO2: 24 mmol/L (ref 20–29)
Calcium: 9.6 mg/dL (ref 8.6–10.2)
Chloride: 101 mmol/L (ref 96–106)
Creatinine, Ser: 1.52 mg/dL — ABNORMAL HIGH (ref 0.76–1.27)
GFR calc Af Amer: 51 mL/min/{1.73_m2} — ABNORMAL LOW (ref >59)
GFR calc non Af Amer: 44 mL/min/{1.73_m2} — ABNORMAL LOW (ref >59)
GLUCOSE: 96 mg/dL (ref 65–99)
Globulin, Total: 2.3 g/dL (ref 1.5–4.5)
Potassium: 5 mmol/L (ref 3.5–5.2)
Sodium: 141 mmol/L (ref 134–144)
Total Protein: 6.9 g/dL (ref 6.0–8.5)

## 2018-04-26 LAB — LIPID PANEL
Chol/HDL Ratio: 4.9 ratio (ref 0.0–5.0)
Cholesterol, Total: 156 mg/dL (ref 100–199)
HDL: 32 mg/dL — AB (ref >39)
LDL Calculated: 85 mg/dL (ref 0–99)
Triglycerides: 193 mg/dL — ABNORMAL HIGH (ref 0–149)
VLDL Cholesterol Cal: 39 mg/dL (ref 5–40)

## 2018-06-10 ENCOUNTER — Telehealth: Payer: Self-pay | Admitting: Oncology

## 2018-06-10 NOTE — Telephone Encounter (Signed)
Scheduled appt per sch msg. Called patient, no answer. Left msg explaining cancelled and rescheduled appt from 5/4

## 2018-06-13 ENCOUNTER — Other Ambulatory Visit: Payer: Medicare Other

## 2018-06-13 ENCOUNTER — Ambulatory Visit: Payer: Medicare Other | Admitting: Oncology

## 2018-06-17 ENCOUNTER — Other Ambulatory Visit: Payer: Self-pay | Admitting: Nurse Practitioner

## 2018-06-17 DIAGNOSIS — N529 Male erectile dysfunction, unspecified: Secondary | ICD-10-CM

## 2018-06-28 ENCOUNTER — Other Ambulatory Visit: Payer: Self-pay | Admitting: Nurse Practitioner

## 2018-06-28 DIAGNOSIS — N529 Male erectile dysfunction, unspecified: Secondary | ICD-10-CM

## 2018-06-30 ENCOUNTER — Other Ambulatory Visit: Payer: Self-pay | Admitting: Nurse Practitioner

## 2018-06-30 DIAGNOSIS — N529 Male erectile dysfunction, unspecified: Secondary | ICD-10-CM

## 2018-07-18 ENCOUNTER — Telehealth: Payer: Self-pay | Admitting: Oncology

## 2018-07-18 ENCOUNTER — Inpatient Hospital Stay: Payer: Medicare Other | Attending: Oncology | Admitting: Oncology

## 2018-07-18 ENCOUNTER — Other Ambulatory Visit: Payer: Self-pay | Admitting: *Deleted

## 2018-07-18 ENCOUNTER — Inpatient Hospital Stay: Payer: Medicare Other

## 2018-07-18 ENCOUNTER — Other Ambulatory Visit: Payer: Self-pay

## 2018-07-18 VITALS — BP 148/88 | HR 75 | Temp 98.7°F | Resp 17 | Ht 77.5 in | Wt 218.6 lb

## 2018-07-18 DIAGNOSIS — C189 Malignant neoplasm of colon, unspecified: Secondary | ICD-10-CM

## 2018-07-18 DIAGNOSIS — Z85048 Personal history of other malignant neoplasm of rectum, rectosigmoid junction, and anus: Secondary | ICD-10-CM

## 2018-07-18 LAB — CMP (CANCER CENTER ONLY)
ALT: 38 U/L (ref 0–44)
AST: 29 U/L (ref 15–41)
Albumin: 4.3 g/dL (ref 3.5–5.0)
Alkaline Phosphatase: 78 U/L (ref 38–126)
Anion gap: 9 (ref 5–15)
BUN: 11 mg/dL (ref 8–23)
CO2: 23 mmol/L (ref 22–32)
Calcium: 8.9 mg/dL (ref 8.9–10.3)
Chloride: 107 mmol/L (ref 98–111)
Creatinine: 1.41 mg/dL — ABNORMAL HIGH (ref 0.61–1.24)
GFR, Est AFR Am: 56 mL/min — ABNORMAL LOW (ref >60)
GFR, Estimated: 48 mL/min — ABNORMAL LOW (ref >60)
Glucose, Bld: 98 mg/dL (ref 70–99)
Potassium: 4 mmol/L (ref 3.5–5.1)
Sodium: 139 mmol/L (ref 135–145)
Total Bilirubin: 0.9 mg/dL (ref 0.3–1.2)
Total Protein: 7.5 g/dL (ref 6.5–8.1)

## 2018-07-18 LAB — CBC (CANCER CENTER ONLY)
HCT: 49.3 % (ref 39.0–52.0)
Hemoglobin: 15.9 g/dL (ref 13.0–17.0)
MCH: 28.2 pg (ref 26.0–34.0)
MCHC: 32.3 g/dL (ref 30.0–36.0)
MCV: 87.4 fL (ref 80.0–100.0)
Platelet Count: 153 10*3/uL (ref 150–400)
RBC: 5.64 MIL/uL (ref 4.22–5.81)
RDW: 15.8 % — ABNORMAL HIGH (ref 11.5–15.5)
WBC Count: 5.4 10*3/uL (ref 4.0–10.5)
nRBC: 0 % (ref 0.0–0.2)

## 2018-07-18 LAB — CEA (IN HOUSE-CHCC): CEA (CHCC-In House): 6.18 ng/mL — ABNORMAL HIGH (ref 0.00–5.00)

## 2018-07-18 NOTE — Progress Notes (Signed)
[place] OFFICE PROGRESS NOTE   Diagnosis: Rectal cancer  INTERVAL HISTORY:   Patrick Woodward returns as scheduled.  Objective:  Vital signs in last 24 hours:  Blood pressure (!) 148/88, pulse 75, temperature 98.7 F (37.1 C), temperature source Oral, resp. rate 17, height 6' 5.5" (1.969 m), weight 218 lb 9.6 oz (99.2 kg), SpO2 98 %. Limited physical examination secondary to distancing with the COVID pandemic  HEENT: Neck without mass Lymphatics: No cervical, supraclavicular, axillary, or inguinal nodes GI: No mass, nontender, no hepatosplenomegaly Vascular: No leg edema  Lab Results:  Lab Results  Component Value Date   WBC 5.4 07/18/2018   HGB 15.9 07/18/2018   HCT 49.3 07/18/2018   MCV 87.4 07/18/2018   PLT 153 07/18/2018   NEUTROABS 2.7 10/25/2013    CMP  Lab Results  Component Value Date   NA 139 07/18/2018   K 4.0 07/18/2018   CL 107 07/18/2018   CO2 23 07/18/2018   GLUCOSE 98 07/18/2018   BUN 11 07/18/2018   CREATININE 1.41 (H) 07/18/2018   CALCIUM 8.9 07/18/2018   PROT 7.5 07/18/2018   ALBUMIN 4.3 07/18/2018   AST 29 07/18/2018   ALT 38 07/18/2018   ALKPHOS 78 07/18/2018   BILITOT 0.9 07/18/2018   GFRNONAA 48 (L) 07/18/2018   GFRAA 56 (L) 07/18/2018    Lab Results  Component Value Date   CEA1 6.18 (H) 07/18/2018    Medications: I have reviewed the patient's current medications.   Assessment/Plan: 1. 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 Status post cycle #1 adjuvant FOLFOX chemotherapy 12/01/2010. He completed cycle 2 beginning 12/15/2010. He completed cycle 3 beginning 01/05/2011 with Neulasta support. He completed cycle 11 on 05/04/2011. He completed the 12th and final cycle on 05/18/2011.   Negative colonoscopy 09/08/2011, benign polypoid lesion in the descending colon. Colonoscopy  08/28/2013 with multiple benign polyps removed.  Restaging CT scans 10/26/2012 with no evidence of metastatic disease.  Restaging CT scans 06/24/2013 with no evidence of recurrent disease  Surveillance colonoscopy 08/17/2016- 16 mm polyp in the cecum; 10 mm polyp in the ascending colon; 2 mm polyp in the ascending colon; 5 mm polyp in the transverse colon.  Pathology on the cecal polyp-tubular adenoma, 1.6 cm; pathology on polyps descending/transverse colon-tubular adenomas.  Mildly elevated CEA 12/07/2016, stable mild elevation 01/07/2017, improved but continued mild elevation 02/23/2017; CT scans recommended, patient declined due to renal dysfunction 2. History of a bowel obstruction secondary to the primary colon mass. 3. History of abdominal pain/constipation secondary to the descending/sigmoid colon mass. 4. History of weight loss. Improved. 5. Status post Port-A-Cath placement 11/27/2010. Removed 12/02/2011. 6. History of neutropenia secondary to chemotherapy. Cycle 3 FOLFOX was held 12/29/2010 due to neutropenia. He received Neulasta with cycle #3 FOLFOX chemotherapy on 01/05/2011. He did not receive Neulasta following cycle 4. Cycle 5 was held for one week due to neutropenia. He received Neulasta with subsequent treatments. Neulasta was held beginning with cycle 10 since the oxaliplatin was discontinued. 7. Thrombocytopenia secondary to chemotherapy. Oxaliplatin was dose reduced beginning with cycle 7. 8. Back pain following cycle 3 FOLFOX. Likely related to Neulasta. 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 Improved.  10. Delayed nausea following cycle 7 of FOLFOX. Aloxi was added to the antiemetic regimen beginning with cycle 8 FOLFOX. He also began prophylactic Decadron on days 2 and  3 following chemotherapy with cycle 8. He did not have nausea following cycle 8 or cycle 9 FOLFOX 11. Colostomy reversal 12/02/2011. 12. Mild elevation of the  creatinine 10/26/2012. Repeat value on 10/28/2012 stable at 1.5. He is followed by nephrology   Disposition: Patrick Woodward is in clinical remission from rectal cancer.  He is now almost 8 years out from diagnosis.  The CEA has been mildly elevated and not significantly changed over the past 18 months.  I have a low clinical suspicion for recurrent rectal cancer. Our initial plan was to discharge him from the medical oncology clinic, but we will contact him and see if he will agree to a follow-up CEA and restaging CTs.  Betsy Coder, MD  07/18/2018  2:18 PM

## 2018-07-18 NOTE — Telephone Encounter (Signed)
Per 6/8 los F/u as needed

## 2018-07-19 ENCOUNTER — Telehealth: Payer: Self-pay | Admitting: *Deleted

## 2018-07-19 ENCOUNTER — Other Ambulatory Visit: Payer: Self-pay | Admitting: Nurse Practitioner

## 2018-07-19 DIAGNOSIS — C189 Malignant neoplasm of colon, unspecified: Secondary | ICD-10-CM

## 2018-07-19 DIAGNOSIS — N529 Male erectile dysfunction, unspecified: Secondary | ICD-10-CM

## 2018-07-19 NOTE — Telephone Encounter (Signed)
Called patient w/results of CEA--is higher, but has been in this range last year. He reports he is not smoking. He agrees to return in 4 months w/lab to see Dr. Benay Spice. MD may recommend CT scan if CEA is higher. He currently only agrees to the lab/OV. Scheduling message sent.

## 2018-07-19 NOTE — Telephone Encounter (Signed)
-----   Message from Ladell Pier, MD sent at 07/18/2018  2:25 PM EDT ----- Please call patient, the CEA is mildly elevated, has been in this range for the past 1.5 years, likely benign finding.  Is he smoking?  I recommend a repeat CEA in 4-6 months and if still elevated we will recommend restaging CTs.  He has declined CTs in the past.  Our plan was to discharge him from medical oncology clinic, but I recommend the CEA and an office visit in 4 months  Please call him and schedule the appointment if he agrees

## 2018-07-19 NOTE — Telephone Encounter (Signed)
SILDENAFIL REFILL

## 2018-07-20 ENCOUNTER — Telehealth: Payer: Self-pay | Admitting: Oncology

## 2018-07-20 NOTE — Telephone Encounter (Signed)
Scheduled appt per sch msg. Called and left msg. Mailed printout °

## 2018-08-02 ENCOUNTER — Ambulatory Visit (INDEPENDENT_AMBULATORY_CARE_PROVIDER_SITE_OTHER): Payer: Medicare Other

## 2018-08-02 ENCOUNTER — Other Ambulatory Visit: Payer: Self-pay

## 2018-08-02 VITALS — BP 138/78 | HR 81 | Temp 98.4°F | Ht 77.4 in | Wt 221.2 lb

## 2018-08-02 DIAGNOSIS — Z0000 Encounter for general adult medical examination without abnormal findings: Secondary | ICD-10-CM | POA: Diagnosis not present

## 2018-08-02 DIAGNOSIS — I10 Essential (primary) hypertension: Secondary | ICD-10-CM | POA: Diagnosis not present

## 2018-08-02 LAB — POCT URINALYSIS DIPSTICK
Bilirubin, UA: NEGATIVE
Blood, UA: NEGATIVE
Glucose, UA: NEGATIVE
Ketones, UA: NEGATIVE
Leukocytes, UA: NEGATIVE
Nitrite, UA: NEGATIVE
Protein, UA: NEGATIVE
Spec Grav, UA: 1.02 (ref 1.010–1.025)
Urobilinogen, UA: 0.2 E.U./dL
pH, UA: 6 (ref 5.0–8.0)

## 2018-08-02 NOTE — Progress Notes (Addendum)
Subjective:   Patrick Woodward is a 78 y.o. male who presents for Medicare Annual/Subsequent preventive examination.  Review of Systems:  n/a Cardiac Risk Factors include: advanced age (>73men, >84 women);dyslipidemia;hypertension;male gender     Objective:    Vitals: BP 138/78 (BP Location: Left Arm, Patient Position: Sitting, Cuff Size: Normal)   Pulse 81   Temp 98.4 F (36.9 C) (Oral)   Ht 6' 5.4" (1.966 m)   Wt 221 lb 3.2 oz (100.3 kg)   SpO2 95%   BMI 25.96 kg/m   Body mass index is 25.96 kg/m.  Advanced Directives 08/02/2018 06/07/2017 06/02/2016 12/03/2015 06/04/2015 12/04/2014 06/05/2014  Does Patient Have a Medical Advance Directive? No No No No No No No  Would patient like information on creating a medical advance directive? No - Patient declined - - No - patient declined information No - patient declined information No - patient declined information No - patient declined information    Tobacco Social History   Tobacco Use  Smoking Status Former Smoker  . Packs/day: 1.00  . Years: 20.00  . Pack years: 20.00  . Types: Cigarettes, Pipe, Cigars  Smokeless Tobacco Never Used  Tobacco Comment   12/02/2011 "stopped smoking ~ 30 yr ago"     Counseling given: Not Answered Comment: 12/02/2011 "stopped smoking ~ 30 yr ago"   Clinical Intake:  Pre-visit preparation completed: Yes  Pain : No/denies pain Pain Score: 0-No pain     Nutritional Status: BMI 25 -29 Overweight Nutritional Risks: None Diabetes: No  How often do you need to have someone help you when you read instructions, pamphlets, or other written materials from your doctor or pharmacy?: 1 - Never What is the last grade level you completed in school?: 10th grade  Interpreter Needed?: No  Information entered by :: NAllen LPN  Past Medical History:  Diagnosis Date  . Colon cancer ([place]) 10/2010   Stage III-mod differentiated adeno  . GERD (gastroesophageal reflux disease)   .  Hypercholesteremia   . Hypertension   . 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 COLON SURGERY  10/20/10   colon resection w/colostomy/Hartmann pouch procedure  . COLONOSCOPY    . COLONOSCOPY N/A 08/28/2013   Procedure: COLONOSCOPY;  Surgeon: Lear Ng, MD;  Location: [facility];  Service: Endoscopy;  Laterality: N/A;  . COLOSTOMY CLOSURE  12/02/2011  . COLOSTOMY CLOSURE  12/02/2011   Procedure: COLOSTOMY CLOSURE;  Surgeon: Joyice Faster. Cornett, MD;  Location: [place];  Service: General;  Laterality: N/A;  . HOT HEMOSTASIS N/A 08/28/2013   Procedure: HOT HEMOSTASIS (ARGON PLASMA COAGULATION/BICAP);  Surgeon: Lear Ng, MD;  Location: [facility];  Service: Endoscopy;  Laterality: N/A;  . PORT-A-CATH REMOVAL  12/02/2011  . PORT-A-CATH REMOVAL  12/02/2011   Procedure: REMOVAL PORT-A-CATH;  Surgeon: Joyice Faster. Cornett, MD;  Location: [place];  Service: General;  Laterality: Left;  . PORTACATH PLACEMENT  11/27/10   Dr. Judeen Hammans chest   Family History  Problem Relation Age of Onset  . Cancer Mother        colon  . Cancer Father        prostate   Social History   Socioeconomic History  . Marital status: Divorced    Spouse name: Not on file  . Number of children: Not on file  . Years of education: Not on file  . Highest education level: Not on file  Occupational History  . Occupation: retired  Scientific laboratory technician  . Financial resource strain: Not hard at all  . Food insecurity    Worry: Never true    Inability: Never true  . Transportation needs    Medical: No    Non-medical: No  Tobacco Use  . Smoking status: Former Smoker    Packs/day: 1.00    Years: 20.00    Pack years: 20.00    Types: Cigarettes, Pipe, Cigars  . Smokeless tobacco: Never Used  . Tobacco comment: 12/02/2011 "stopped smoking ~ 30 yr ago"  Substance and Sexual Activity  . Alcohol use: No  . Drug use: No  . Sexual activity:  Yes  Lifestyle  . Physical activity    Days per week: 3 days    Minutes per session: 30 min  . Stress: Not at all  Relationships  . Social Herbalist on phone: Not on file    Gets together: Not on file    Attends religious service: Not on file    Active member of club or organization: Not on file    Attends meetings of clubs or organizations: Not on file    Relationship status: Not on file  Other Topics Concern  . Not on file  Social History Narrative  . Not on file    Outpatient Encounter Medications as of 08/02/2018  Medication Sig  . aspirin 325 MG EC tablet Take 325 mg by mouth daily.  Marland Kitchen atorvastatin (LIPITOR) 20 MG tablet Take 1 tablet (20 mg total) by mouth daily.  . cholecalciferol (VITAMIN D) 1000 UNITS tablet Take 2,000 Units by mouth daily.  . metoprolol succinate (TOPROL-XL) 50 MG 24 hr tablet Take 1 tablet (50 mg total) by mouth daily.  . Multiple Vitamin (MULTIVITAMIN PO) Take 1 tablet by mouth daily.   . sildenafil (REVATIO) 20 MG tablet Take 1 tablet by mouth once daily  . vitamin E (VITAMIN E) 400 UNIT capsule Take 400 Units by mouth daily.     No facility-administered encounter medications on file as of 08/02/2018.     Activities of Daily Living In your present state of health, do you have any difficulty performing the following activities: 08/02/2018  Hearing? N  Vision? Y  Comment varies at times  Difficulty concentrating or making decisions? N  Walking or climbing stairs? N  Dressing or bathing? N  Doing errands, shopping? N  Preparing Food and eating ? N  Using the Toilet? N  In the past six months, have you accidently leaked urine? N  Do you have problems with loss of bowel control? N  Managing your Medications? N  Managing your Finances? N  Housekeeping or managing your Housekeeping? N  Some recent data might be hidden    Patient Care Team: [name], FNP as PCP - General ([place]) Warden Fillers, MD as Consulting  Physician (Ophthalmology) Ladell Pier, MD as Consulting Physician (Oncology)   Assessment:   This is a routine wellness examination for Patrick Woodward.  Exercise Activities and Dietary recommendations Current Exercise Habits: Home exercise routine, Type of exercise: walking, Time (Minutes): 30, Frequency (Times/Week): 3, Weekly Exercise (Minutes/Week): 90  Goals    . Patient Stated     No goals       Fall Risk Fall Risk  08/02/2018 12/04/2013  Falls in the past year? 0 No  Risk for fall due to : Medication side effect -  Follow up Education provided;Falls prevention discussed -  Is the patient's home free of loose throw rugs in walkways, pet beds, electrical cords, etc?   yes      Grab bars in the bathroom? no      Handrails on the stairs?   yes      Adequate lighting?   yes  Timed Get Up and Go Performed: n/a  Depression Screen PHQ 2/9 Scores 08/02/2018  PHQ - 2 Score 0  PHQ- 9 Score 0    Cognitive Function     6CIT Screen 08/02/2018  What Year? 0 points  What month? 0 points  What time? 0 points  Count back from 20 0 points  Months in reverse 0 points  Repeat phrase 0 points  Total Score 0    Immunization History  Administered Date(s) Administered  . Influenza Split 02/23/2011  . Influenza-Unspecified 10/26/2011, 10/11/2013  . Pneumococcal Polysaccharide-23 12/03/2011    Qualifies for Shingles Vaccine? yes  Screening Tests Health Maintenance  Topic Date Due  . PNA vac Low Risk Adult (2 of 2 - PCV13) 12/02/2012  . INFLUENZA VACCINE  09/10/2018  . TETANUS/TDAP  09/12/2019  . COLONOSCOPY  08/18/2026   Cancer Screenings: Lung: Low Dose CT Chest recommended if Age 59-80 years, 30 pack-year currently smoking OR have quit w/in 15years. Patient does not qualify. Colorectal: up to date  Additional Screenings:  Hepatitis C Screening:n/a      Plan:    6 CIT normal. No goals set  I have personally reviewed and noted the following in the patient's chart:    . Medical and social history . Use of alcohol, tobacco or illicit drugs  . Current medications and supplements . Functional ability and status . Nutritional status . Physical activity . Advanced directives . List of other physicians . Hospitalizations, surgeries, and ER visits in previous 12 months . Vitals . Screenings to include cognitive, depression, and falls . Referrals and appointments  In addition, I have reviewed and discussed with patient certain preventive protocols, quality metrics, and best practice recommendations. A written personalized care plan for preventive services as well as general preventive health recommendations were provided to patient.     Kellie Simmering, LPN  [id number]/63/[id number]

## 2018-08-02 NOTE — Patient Instructions (Signed)
Patrick Woodward , Thank you for taking time to come for your Medicare Wellness Visit. I appreciate your ongoing commitment to your health goals. Please review the following plan we discussed and let me know if I can assist you in the future.   Screening recommendations/referrals: Colonoscopy: 08/2016 Recommended yearly ophthalmology/optometry visit for glaucoma screening and checkup Recommended yearly dental visit for hygiene and checkup  Vaccinations: Influenza vaccine: 10/2017 Pneumococcal vaccine: 10/2017 Tdap vaccine: 09/2009 Shingles vaccine: discussed    Advanced directives: Advance directive discussed with you today. Even though you declined this today please call our office should you change your mind and we can give you the proper paperwork for you to fill out.   Conditions/risks identified: overweight  Next appointment: 11/17/2018 at 9:30  Preventive Care 69 Years and Older, Male Preventive care refers to lifestyle choices and visits with your health care provider that can promote health and wellness. What does preventive care include?  A yearly physical exam. This is also called an annual well check.  Dental exams once or twice a year.  Routine eye exams. Ask your health care provider how often you should have your eyes checked.  Personal lifestyle choices, including:  Daily care of your teeth and gums.  Regular physical activity.  Eating a healthy diet.  Avoiding tobacco and drug use.  Limiting alcohol use.  Practicing safe sex.  Taking low doses of aspirin every day.  Taking vitamin and mineral supplements as recommended by your health care provider. What happens during an annual well check? The services and screenings done by your health care provider during your annual well check will depend on your age, overall health, lifestyle risk factors, and family history of disease. Counseling  Your health care provider may ask you questions about your:  Alcohol  use.  Tobacco use.  Drug use.  Emotional well-being.  Home and relationship well-being.  Sexual activity.  Eating habits.  History of falls.  Memory and ability to understand (cognition).  Work and work Statistician. Screening  You may have the following tests or measurements:  Height, weight, and BMI.  Blood pressure.  Lipid and cholesterol levels. These may be checked every 5 years, or more frequently if you are over 66 years old.  Skin check.  Lung cancer screening. You may have this screening every year starting at age 68 if you have a 30-pack-year history of smoking and currently smoke or have quit within the past 15 years.  Fecal occult blood test (FOBT) of the stool. You may have this test every year starting at age 28.  Flexible sigmoidoscopy or colonoscopy. You may have a sigmoidoscopy every 5 years or a colonoscopy every 10 years starting at age 60.  Prostate cancer screening. Recommendations will vary depending on your family history and other risks.  Hepatitis C blood test.  Hepatitis B blood test.  Sexually transmitted disease (STD) testing.  Diabetes screening. This is done by checking your blood sugar (glucose) after you have not eaten for a while (fasting). You may have this done every 1-3 years.  Abdominal aortic aneurysm (AAA) screening. You may need this if you are a current or former smoker.  Osteoporosis. You may be screened starting at age 44 if you are at high risk. Talk with your health care provider about your test results, treatment options, and if necessary, the need for more tests. Vaccines  Your health care provider may recommend certain vaccines, such as:  Influenza vaccine. This is recommended every year.  Tetanus, diphtheria, and acellular pertussis (Tdap, Td) vaccine. You may need a Td booster every 10 years.  Zoster vaccine. You may need this after age 31.  Pneumococcal 13-valent conjugate (PCV13) vaccine. One dose is  recommended after age 15.  Pneumococcal polysaccharide (PPSV23) vaccine. One dose is recommended after age 79. Talk to your health care provider about which screenings and vaccines you need and how often you need them. This information is not intended to replace advice given to you by your health care provider. Make sure you discuss any questions you have with your health care provider. Document Released: 02/22/2015 Document Revised: 10/16/2015 Document Reviewed: 11/27/2014 Elsevier Interactive Patient Education  2017 [place] Prevention in the Home Falls can cause injuries. They can happen to people of all ages. There are many things you can do to make your home safe and to help prevent falls. What can I do on the outside of my home?  Regularly fix the edges of walkways and driveways and fix any cracks.  Remove anything that might make you trip as you walk through a door, such as a raised step or threshold.  Trim any bushes or trees on the path to your home.  Use bright outdoor lighting.  Clear any walking paths of anything that might make someone trip, such as rocks or tools.  Regularly check to see if handrails are loose or broken. Make sure that both sides of any steps have handrails.  Any raised decks and porches should have guardrails on the edges.  Have any leaves, snow, or ice cleared regularly.  Use sand or salt on walking paths during winter.  Clean up any spills in your garage right away. This includes oil or grease spills. What can I do in the bathroom?  Use night lights.  Install grab bars by the toilet and in the tub and shower. Do not use towel bars as grab bars.  Use non-skid mats or decals in the tub or shower.  If you need to sit down in the shower, use a plastic, non-slip stool.  Keep the floor dry. Clean up any water that spills on the floor as soon as it happens.  Remove soap buildup in the tub or shower regularly.  Attach bath mats  securely with double-sided non-slip rug tape.  Do not have throw rugs and other things on the floor that can make you trip. What can I do in the bedroom?  Use night lights.  Make sure that you have a light by your bed that is easy to reach.  Do not use any sheets or blankets that are too big for your bed. They should not hang down onto the floor.  Have a firm chair that has side arms. You can use this for support while you get dressed.  Do not have throw rugs and other things on the floor that can make you trip. What can I do in the kitchen?  Clean up any spills right away.  Avoid walking on wet floors.  Keep items that you use a lot in easy-to-reach places.  If you need to reach something above you, use a strong step stool that has a grab bar.  Keep electrical cords out of the way.  Do not use floor polish or wax that makes floors slippery. If you must use wax, use non-skid floor wax.  Do not have throw rugs and other things on the floor that can make you trip. What can I do  with my stairs?  Do not leave any items on the stairs.  Make sure that there are handrails on both sides of the stairs and use them. Fix handrails that are broken or loose. Make sure that handrails are as long as the stairways.  Check any carpeting to make sure that it is firmly attached to the stairs. Fix any carpet that is loose or worn.  Avoid having throw rugs at the top or bottom of the stairs. If you do have throw rugs, attach them to the floor with carpet tape.  Make sure that you have a light switch at the top of the stairs and the bottom of the stairs. If you do not have them, ask someone to add them for you. What else can I do to help prevent falls?  Wear shoes that:  Do not have high heels.  Have rubber bottoms.  Are comfortable and fit you well.  Are closed at the toe. Do not wear sandals.  If you use a stepladder:  Make sure that it is fully opened. Do not climb a closed  stepladder.  Make sure that both sides of the stepladder are locked into place.  Ask someone to hold it for you, if possible.  Clearly mark and make sure that you can see:  Any grab bars or handrails.  First and last steps.  Where the edge of each step is.  Use tools that help you move around (mobility aids) if they are needed. These include:  Canes.  Walkers.  Scooters.  Crutches.  Turn on the lights when you go into a dark area. Replace any light bulbs as soon as they burn out.  Set up your furniture so you have a clear path. Avoid moving your furniture around.  If any of your floors are uneven, fix them.  If there are any pets around you, be aware of where they are.  Review your medicines with your doctor. Some medicines can make you feel dizzy. This can increase your chance of falling. Ask your doctor what other things that you can do to help prevent falls. This information is not intended to replace advice given to you by your health care provider. Make sure you discuss any questions you have with your health care provider. Document Released: 11/22/2008 Document Revised: 07/04/2015 Document Reviewed: 03/02/2014 Elsevier Interactive Patient Education  2017 [employer].

## 2018-08-12 ENCOUNTER — Other Ambulatory Visit: Payer: Self-pay | Admitting: Nurse Practitioner

## 2018-08-12 DIAGNOSIS — N529 Male erectile dysfunction, unspecified: Secondary | ICD-10-CM

## 2018-09-12 ENCOUNTER — Other Ambulatory Visit: Payer: Self-pay | Admitting: Nurse Practitioner

## 2018-09-12 DIAGNOSIS — N529 Male erectile dysfunction, unspecified: Secondary | ICD-10-CM

## 2018-09-12 NOTE — Telephone Encounter (Signed)
Sildebafil refill

## 2018-09-29 ENCOUNTER — Other Ambulatory Visit: Payer: Self-pay | Admitting: Nurse Practitioner

## 2018-09-29 DIAGNOSIS — N529 Male erectile dysfunction, unspecified: Secondary | ICD-10-CM

## 2018-10-03 ENCOUNTER — Other Ambulatory Visit: Payer: Self-pay

## 2018-10-03 DIAGNOSIS — N529 Male erectile dysfunction, unspecified: Secondary | ICD-10-CM

## 2018-10-03 MED ORDER — SILDENAFIL CITRATE 20 MG PO TABS: 20.0000 mg | ORAL_TABLET | Freq: Every day | ORAL | 2 refills | Status: DC

## 2018-11-17 ENCOUNTER — Ambulatory Visit: Payer: Medicare Other

## 2018-11-17 ENCOUNTER — Ambulatory Visit (INDEPENDENT_AMBULATORY_CARE_PROVIDER_SITE_OTHER): Payer: Medicare Other | Admitting: Nurse Practitioner

## 2018-11-17 ENCOUNTER — Other Ambulatory Visit: Payer: Self-pay

## 2018-11-17 ENCOUNTER — Encounter: Payer: Self-pay | Admitting: Nurse Practitioner

## 2018-11-17 VITALS — BP 120/70 | Temp 98.2°F | Ht 74.4 in | Wt 221.8 lb

## 2018-11-17 DIAGNOSIS — I10 Essential (primary) hypertension: Secondary | ICD-10-CM | POA: Diagnosis not present

## 2018-11-17 DIAGNOSIS — Z85038 Personal history of other malignant neoplasm of large intestine: Secondary | ICD-10-CM

## 2018-11-17 DIAGNOSIS — R351 Nocturia: Secondary | ICD-10-CM

## 2018-11-17 DIAGNOSIS — Z0000 Encounter for general adult medical examination without abnormal findings: Secondary | ICD-10-CM | POA: Diagnosis not present

## 2018-11-17 DIAGNOSIS — N529 Male erectile dysfunction, unspecified: Secondary | ICD-10-CM

## 2018-11-17 DIAGNOSIS — E663 Overweight: Secondary | ICD-10-CM | POA: Diagnosis not present

## 2018-11-17 DIAGNOSIS — E782 Mixed hyperlipidemia: Secondary | ICD-10-CM | POA: Diagnosis not present

## 2018-11-17 LAB — POCT URINALYSIS DIPSTICK
Bilirubin, UA: NEGATIVE
Blood, UA: NEGATIVE
Glucose, UA: NEGATIVE
Ketones, UA: NEGATIVE
Leukocytes, UA: NEGATIVE
Nitrite, UA: NEGATIVE
Protein, UA: NEGATIVE
Spec Grav, UA: 1.01 (ref 1.010–1.025)
Urobilinogen, UA: 0.2 E.U./dL
pH, UA: 6 (ref 5.0–8.0)

## 2018-11-17 LAB — POCT UA - MICROALBUMIN
Albumin/Creatinine Ratio, Urine, POC: 30
Creatinine, POC: 50 mg/dL
Microalbumin Ur, POC: 10 mg/L

## 2018-11-17 MED ORDER — ATORVASTATIN CALCIUM 20 MG PO TABS: 20.0000 mg | ORAL_TABLET | Freq: Every day | ORAL | 1 refills | Status: DC

## 2018-11-17 MED ORDER — METOPROLOL SUCCINATE ER 50 MG PO TB24: 50.0000 mg | ORAL_TABLET | Freq: Every day | ORAL | 1 refills | Status: DC

## 2018-11-17 MED ORDER — SILDENAFIL CITRATE 20 MG PO TABS: 20.0000 mg | ORAL_TABLET | Freq: Every day | ORAL | 6 refills | Status: DC

## 2018-11-17 NOTE — Patient Instructions (Signed)
Health Maintenance  Topic Date Due  . PNA vac Low Risk Adult (2 of 2 - PCV13) 12/02/2012  . INFLUENZA VACCINE  09/10/2018  . TETANUS/TDAP  09/12/2019  . COLONOSCOPY  08/18/2026   Health Maintenance After Age 76 After age 16, you are at a higher risk for certain long-term diseases and infections as well as injuries from falls. Falls are a major cause of broken bones and head injuries in people who are older than age 60. Getting regular preventive care can help to keep you healthy and well. Preventive care includes getting regular testing and making lifestyle changes as recommended by your health care provider. Talk with your health care provider about:  Which screenings and tests you should have. A screening is a test that checks for a disease when you have no symptoms.  A diet and exercise plan that is right for you. What should I know about screenings and tests to prevent falls? Screening and testing are the best ways to find a health problem early. Early diagnosis and treatment give you the best chance of managing medical conditions that are common after age 34. Certain conditions and lifestyle choices may make you more likely to have a fall. Your health care provider may recommend:  Regular vision checks. Poor vision and conditions such as cataracts can make you more likely to have a fall. If you wear glasses, make sure to get your prescription updated if your vision changes.  Medicine review. Work with your health care provider to regularly review all of the medicines you are taking, including over-the-counter medicines. Ask your health care provider about any side effects that may make you more likely to have a fall. Tell your health care provider if any medicines that you take make you feel dizzy or sleepy.  Osteoporosis screening. Osteoporosis is a condition that causes the bones to get weaker. This can make the bones weak and cause them to break more easily.  Blood pressure screening.  Blood pressure changes and medicines to control blood pressure can make you feel dizzy.  Strength and balance checks. Your health care provider may recommend certain tests to check your strength and balance while standing, walking, or changing positions.  Foot health exam. Foot pain and numbness, as well as not wearing proper footwear, can make you more likely to have a fall.  Depression screening. You may be more likely to have a fall if you have a fear of falling, feel emotionally low, or feel unable to do activities that you used to do.  Alcohol use screening. Using too much alcohol can affect your balance and may make you more likely to have a fall. What actions can I take to lower my risk of falls? General instructions  Talk with your health care provider about your risks for falling. Tell your health care provider if: ? You fall. Be sure to tell your health care provider about all falls, even ones that seem minor. ? You feel dizzy, sleepy, or off-balance.  Take over-the-counter and prescription medicines only as told by your health care provider. These include any supplements.  Eat a healthy diet and maintain a healthy weight. A healthy diet includes low-fat dairy products, low-fat (lean) meats, and fiber from whole grains, beans, and lots of fruits and vegetables. Home safety  Remove any tripping hazards, such as rugs, cords, and clutter.  Install safety equipment such as grab bars in bathrooms and safety rails on stairs.  Keep rooms and walkways well-lit. Activity  Follow a regular exercise program to stay fit. This will help you maintain your balance. Ask your health care provider what types of exercise are appropriate for you.  If you need a cane or walker, use it as recommended by your health care provider.  Wear supportive shoes that have nonskid soles. Lifestyle  Do not drink alcohol if your health care provider tells you not to drink.  If you drink alcohol, limit  how much you have: ? 0-1 drink a day for women. ? 0-2 drinks a day for men.  Be aware of how much alcohol is in your drink. In the [place] one drink equals one typical bottle of beer (12 oz), one-half glass of wine (5 oz), or one shot of hard liquor (1 oz).  Do not use any products that contain nicotine or tobacco, such as cigarettes and e-cigarettes. If you need help quitting, ask your health care provider. Summary  Having a healthy lifestyle and getting preventive care can help to protect your health and wellness after age 53.  Screening and testing are the best way to find a health problem early and help you avoid having a fall. Early diagnosis and treatment give you the best chance for managing medical conditions that are more common for people who are older than age 48.  Falls are a major cause of broken bones and head injuries in people who are older than age 43. Take precautions to prevent a fall at home.  Work with your health care provider to learn what changes you can make to improve your health and wellness and to prevent falls. This information is not intended to replace advice given to you by your health care provider. Make sure you discuss any questions you have with your health care provider. Document Released: 12/09/2016 Document Revised: 05/19/2018 Document Reviewed: 12/09/2016 Elsevier Patient Education  2020 [employer].

## 2018-11-17 NOTE — Progress Notes (Signed)
Subjective:     Patient ID: Patrick Woodward , male    DOB: 1941-01-21 , 78 y.o.   MRN: [id number]   Chief Complaint  Patient presents with  . Annual Exam    HPI  He is scheduled to see his Oncologist on October 12th  Hypertension This is a chronic problem. The current episode started more than 1 year ago. The problem is unchanged. The problem is uncontrolled. Pertinent negatives include no anxiety, chest pain, headaches or palpitations. There are no associated agents to hypertension. Risk factors for coronary artery disease include male gender and sedentary lifestyle. Past treatments include beta blockers. The current treatment provides no improvement. There are no compliance problems.  There is no history of angina or kidney disease. There is no history of chronic renal disease.    Men's preventive visit. Patient Health Questionnaire (PHQ-2) is    Office Visit from 11/17/2018 in [facility]  PHQ-2 Total Score  0     Patient is on a regular diet. Marital status: Divorced. Relevant history for alcohol use is:  Social History   Substance and Sexual Activity  Alcohol Use No   Relevant history for tobacco use is:  Social History   Tobacco Use  Smoking Status Former Smoker  . Packs/day: 1.00  . Years: 20.00  . Pack years: 20.00  . Types: Cigarettes, Pipe, Cigars  Smokeless Tobacco Never Used  Tobacco Comment   12/02/2011 "stopped smoking ~ 30 yr ago"   Past Medical History:  Diagnosis Date  . Colon cancer ([place]) 10/2010   Stage III-mod differentiated adeno  . GERD (gastroesophageal reflux disease)   . Hypercholesteremia   . Hypertension   . 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 Cancer Mother        colon  . Cancer Father        prostate     Current Outpatient Medications:  .  aspirin 325 MG EC tablet, Take 325 mg by mouth daily., Disp: , Rfl:  .  atorvastatin  (LIPITOR) 20 MG tablet, Take 1 tablet (20 mg total) by mouth daily., Disp: 90 tablet, Rfl: 1 .  cholecalciferol (VITAMIN D) 1000 UNITS tablet, Take 2,000 Units by mouth daily., Disp: , Rfl:  .  metoprolol succinate (TOPROL-XL) 50 MG 24 hr tablet, Take 1 tablet (50 mg total) by mouth daily., Disp: 90 tablet, Rfl: 1 .  Multiple Vitamin (MULTIVITAMIN PO), Take 1 tablet by mouth daily. , Disp: , Rfl:  .  sildenafil (REVATIO) 20 MG tablet, Take 1 tablet (20 mg total) by mouth daily., Disp: 30 tablet, Rfl: 2 .  vitamin E (VITAMIN E) 400 UNIT capsule, Take 400 Units by mouth daily.  , Disp: , Rfl:    Allergies  Allergen Reactions  . Procardia [Nifedipine] Other (See Comments)    irratic heart rate      Review of Systems  Constitutional: Negative.  Negative for fatigue.  HENT: Negative.   Eyes: Negative.   Respiratory: Negative.  Negative for cough.   Cardiovascular: Negative.  Negative for chest pain, palpitations and leg swelling.  Gastrointestinal: Negative.   Endocrine: Negative.  Negative for polydipsia, polyphagia and polyuria.  Genitourinary: Negative.   Musculoskeletal: Negative.   Skin: Negative.   Allergic/Immunologic: Negative.   Neurological: Negative for dizziness and headaches.  Hematological: Negative.   Psychiatric/Behavioral: Negative.      Today's Vitals  11/17/18 0943  BP: 120/70  Temp: 98.2 F (36.8 C)  TempSrc: Oral  Weight: 221 lb 12.8 oz (100.6 kg)  Height: 6' 2.4" (1.89 m)  PainSc: 0-No pain   Body mass index is 28.17 kg/m.   Objective:  Physical Exam Vitals signs reviewed.  Constitutional:      General: He is not in acute distress.    Appearance: Normal appearance. He is obese.  HENT:     Head: Normocephalic and atraumatic.     Right Ear: Tympanic membrane, ear canal and external ear normal. There is no impacted cerumen.     Left Ear: Tympanic membrane, ear canal and external ear normal. There is no impacted cerumen.  Eyes:     Extraocular  Movements: Extraocular movements intact.     Conjunctiva/sclera: Conjunctivae normal.     Pupils: Pupils are equal, round, and reactive to light.  Neck:     Musculoskeletal: Normal range of motion and neck supple.  Cardiovascular:     Rate and Rhythm: Normal rate and regular rhythm.     Pulses: Normal pulses.     Heart sounds: No murmur. No friction rub.  Pulmonary:     Effort: Pulmonary effort is normal. No respiratory distress.     Breath sounds: Normal breath sounds.  Abdominal:     General: Abdomen is flat. Bowel sounds are normal.     Palpations: Abdomen is soft.     Comments: Surgical scarring noted to abdomen  Musculoskeletal: Normal range of motion.  Skin:    General: Skin is warm and dry.     Capillary Refill: Capillary refill takes less than 2 seconds.  Neurological:     General: No focal deficit present.     Mental Status: He is alert and oriented to person, place, and time.  Psychiatric:        Mood and Affect: Mood normal.        Behavior: Behavior normal.        Thought Content: Thought content normal.        Judgment: Judgment normal.         Assessment And Plan:     1. Health maintenance examination . Behavior modifications discussed and diet history reviewed.   . Pt will continue to exercise regularly and modify diet with low GI, plant based foods and decrease intake of processed foods.  . Recommend intake of daily multivitamin, Vitamin D, and calcium.  . Recommend for preventive screenings, as well as recommend immunizations that include influenza (reports he received at [facility] on [address]), he received pneumonia vaccine will bring records.   2. Essential hypertension . B/P is controlled.  . CMP ordered to check renal function.  . The importance of regular exercise and dietary modification was stressed to the patient.  . EKG reveals NSR - POCT Urinalysis Dipstick (81002) - POCT UA - Microalbumin - EKG 12-Lead  3. Mixed hyperlipidemia  Chronic,  controlled  Continue with current medications  4. Overweight (BMI 25.0-29.9)  Encouraged to walk for exercise as tolerated  5. Nocturia  Will check PSA today - PSA  6. Erectile dysfunction, unspecified erectile dysfunction type  He is taking sildenafil when needed  7. History of colon cancer  He has an appt with Dr. Benay Spice later this month  Overall he feels he is doing well  [name], FNP    THE PATIENT IS ENCOURAGED TO PRACTICE SOCIAL DISTANCING DUE TO THE COVID-19 PANDEMIC.

## 2018-11-18 LAB — CMP14 + ANION GAP
ALT: 44 IU/L (ref 0–44)
AST: 35 IU/L (ref 0–40)
Albumin/Globulin Ratio: 1.5 (ref 1.2–2.2)
Albumin: 4.4 g/dL (ref 3.7–4.7)
Alkaline Phosphatase: 81 IU/L (ref 39–117)
Anion Gap: 16 mmol/L (ref 10.0–18.0)
BUN/Creatinine Ratio: 8 — ABNORMAL LOW (ref 10–24)
BUN: 11 mg/dL (ref 8–27)
Bilirubin Total: 0.9 mg/dL (ref 0.0–1.2)
CO2: 20 mmol/L (ref 20–29)
Calcium: 9.5 mg/dL (ref 8.6–10.2)
Chloride: 103 mmol/L (ref 96–106)
Creatinine, Ser: 1.44 mg/dL — ABNORMAL HIGH (ref 0.76–1.27)
GFR calc Af Amer: 55 mL/min/{1.73_m2} — ABNORMAL LOW (ref >59)
GFR calc non Af Amer: 47 mL/min/{1.73_m2} — ABNORMAL LOW (ref >59)
Globulin, Total: 3 g/dL (ref 1.5–4.5)
Glucose: 102 mg/dL — ABNORMAL HIGH (ref 65–99)
Potassium: 4.1 mmol/L (ref 3.5–5.2)
Sodium: 139 mmol/L (ref 134–144)
Total Protein: 7.4 g/dL (ref 6.0–8.5)

## 2018-11-18 LAB — LIPID PANEL
Chol/HDL Ratio: 5.2 ratio — ABNORMAL HIGH (ref 0.0–5.0)
Cholesterol, Total: 157 mg/dL (ref 100–199)
HDL: 30 mg/dL — ABNORMAL LOW (ref >39)
LDL Chol Calc (NIH): 87 mg/dL (ref 0–99)
Triglycerides: 235 mg/dL — ABNORMAL HIGH (ref 0–149)
VLDL Cholesterol Cal: 40 mg/dL (ref 5–40)

## 2018-11-18 LAB — VITAMIN D 25 HYDROXY (VIT D DEFICIENCY, FRACTURES): Vit D, 25-Hydroxy: 38.7 ng/mL (ref 30.0–100.0)

## 2018-11-18 LAB — PSA: Prostate Specific Ag, Serum: 0.8 ng/mL (ref 0.0–4.0)

## 2018-11-21 ENCOUNTER — Inpatient Hospital Stay: Payer: Medicare Other

## 2018-11-21 ENCOUNTER — Other Ambulatory Visit: Payer: Self-pay

## 2018-11-21 ENCOUNTER — Inpatient Hospital Stay: Payer: Medicare Other | Attending: Oncology | Admitting: Oncology

## 2018-11-21 VITALS — BP 162/70 | HR 88 | Temp 97.8°F | Resp 18 | Ht 74.0 in | Wt 223.5 lb

## 2018-11-21 DIAGNOSIS — Z85038 Personal history of other malignant neoplasm of large intestine: Secondary | ICD-10-CM | POA: Insufficient documentation

## 2018-11-21 DIAGNOSIS — C189 Malignant neoplasm of colon, unspecified: Secondary | ICD-10-CM | POA: Diagnosis not present

## 2018-11-21 LAB — CEA (IN HOUSE-CHCC): CEA (CHCC-In House): 4.97 ng/mL (ref 0.00–5.00)

## 2018-11-21 NOTE — Progress Notes (Signed)
[place] OFFICE PROGRESS NOTE    Diagnosis: Colon cancer  INTERVAL HISTORY:   Mr. Borghese returns as scheduled.  He feels well.  Good appetite and energy level.  No complaint.  He has mild intermittent numbness in the extremities.  This does not interfere with activity.  Objective:  Vital signs in last 24 hours:  Blood pressure (!) 164/102, pulse 88, temperature 97.8 F (36.6 C), temperature source Temporal, resp. rate 18, height 6\' 2"  (1.88 m), weight 223 lb 8 oz (101.4 kg), SpO2 93 %.   Limited physical examination secondary to distancing with the COVID pandemic HEENT: Slight soft tissue fullness in the left supraclavicular fossa without a discrete mass Lymphatics: No cervical, supraclavicular, axillary, or inguinal nodes GI: No hepatosplenomegaly, no mass, nontender Vascular: No leg edema   Lab Results:  Lab Results  Component Value Date   WBC 5.4 07/18/2018   HGB 15.9 07/18/2018   HCT 49.3 07/18/2018   MCV 87.4 07/18/2018   PLT 153 07/18/2018   NEUTROABS 2.7 10/25/2013    CMP  Lab Results  Component Value Date   NA 139 11/17/2018   K 4.1 11/17/2018   CL 103 11/17/2018   CO2 20 11/17/2018   GLUCOSE 102 (H) 11/17/2018   BUN 11 11/17/2018   CREATININE 1.44 (H) 11/17/2018   CALCIUM 9.5 11/17/2018   PROT 7.4 11/17/2018   ALBUMIN 4.4 11/17/2018   AST 35 11/17/2018   ALT 44 11/17/2018   ALKPHOS 81 11/17/2018   BILITOT 0.9 11/17/2018   GFRNONAA 47 (L) 11/17/2018   GFRAA 55 (L) 11/17/2018    Lab Results  Component Value Date   CEA1 6.18 (H) 07/18/2018     Medications: I have reviewed the patient's current medications.   Assessment/Plan:  1. 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 Status post cycle #1 adjuvant FOLFOX chemotherapy 12/01/2010. He completed cycle 2 beginning 12/15/2010. He completed cycle 3  beginning 01/05/2011 with Neulasta support. He completed cycle 11 on 05/04/2011. He completed the 12th and final cycle on 05/18/2011.   Negative colonoscopy 09/08/2011, benign polypoid lesion in the descending colon. Colonoscopy 08/28/2013 with multiple benign polyps removed.  Restaging CT scans 10/26/2012 with no evidence of metastatic disease.  Restaging CT scans 06/24/2013 with no evidence of recurrent disease  Surveillance colonoscopy 08/17/2016- 16 mm polyp in the cecum; 10 mm polyp in the ascending colon; 2 mm polyp in the ascending colon; 5 mm polyp in the transverse colon.  Pathology on the cecal polyp-tubular adenoma, 1.6 cm; pathology on polyps descending/transverse colon-tubular adenomas.  Mildly elevated CEA 12/07/2016, stable mild elevation 01/07/2017, improved but continued mild elevation 02/23/2017; CT scans recommended, patient declined due to renal dysfunction 2. History of a bowel obstruction secondary to the primary colon mass. 3. History of abdominal pain/constipation secondary to the descending/sigmoid colon mass. 4. History of weight loss. Improved. 5. Status post Port-A-Cath placement 11/27/2010. Removed 12/02/2011. 6. History of neutropenia secondary to chemotherapy. Cycle 3 FOLFOX was held 12/29/2010 due to neutropenia. He received Neulasta with cycle #3 FOLFOX chemotherapy on 01/05/2011. He did not receive Neulasta following cycle 4. Cycle 5 was held for one week due to neutropenia. He received Neulasta with subsequent treatments. Neulasta was held beginning with cycle 10 since the oxaliplatin was discontinued. 7. Thrombocytopenia secondary to chemotherapy. Oxaliplatin was dose reduced beginning with cycle 7. 8. Back pain following cycle 3 FOLFOX. Likely related  to Neulasta. 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 Improved.  10. Delayed nausea following cycle 7 of FOLFOX. Aloxi was added to the antiemetic regimen beginning  with cycle 8 FOLFOX. He also began prophylactic Decadron on days 2 and 3 following chemotherapy with cycle 8. He did not have nausea following cycle 8 or cycle 9 FOLFOX 11. Colostomy reversal 12/02/2011. 12. Mild elevation of the creatinine 10/26/2012. Repeat value on 10/28/2012 stable at 1.5. He is followed by nephrology    Disposition: Mr. Duffer is in clinical remission from colon cancer.  We will follow-up on the CEA from today.  The soft tissue fullness in the left supraclavicular fossa is likely benign finding.  He will contact us if this area changes.  Mr. Kretzer would like to continue follow-up at the Cancer center.  He will return for an office visit and CEA in 1 year.  Betsy Coder, MD  11/21/2018  11:39 AM

## 2018-11-22 ENCOUNTER — Telehealth: Payer: Self-pay | Admitting: Oncology

## 2018-11-22 NOTE — Telephone Encounter (Signed)
Scheduled per los. Called and left msg. Mailed printout  °

## 2019-02-16 ENCOUNTER — Telehealth: Payer: Self-pay

## 2019-02-16 NOTE — Telephone Encounter (Signed)
Patient called wanting to get a covid shot I provided him with the number to the health department so he can call to schedule an appt. YRL,RMA

## 2019-02-21 ENCOUNTER — Ambulatory Visit: Payer: Medicare Other | Admitting: Nurse Practitioner

## 2019-03-08 ENCOUNTER — Other Ambulatory Visit: Payer: Self-pay | Admitting: Nurse Practitioner

## 2019-03-08 DIAGNOSIS — N529 Male erectile dysfunction, unspecified: Secondary | ICD-10-CM

## 2019-03-14 ENCOUNTER — Other Ambulatory Visit: Payer: Self-pay | Admitting: Nurse Practitioner

## 2019-03-14 DIAGNOSIS — N529 Male erectile dysfunction, unspecified: Secondary | ICD-10-CM

## 2019-03-21 ENCOUNTER — Ambulatory Visit (INDEPENDENT_AMBULATORY_CARE_PROVIDER_SITE_OTHER): Payer: Medicare Other | Admitting: Nurse Practitioner

## 2019-03-21 ENCOUNTER — Encounter: Payer: Self-pay | Admitting: Nurse Practitioner

## 2019-03-21 ENCOUNTER — Other Ambulatory Visit: Payer: Self-pay

## 2019-03-21 VITALS — BP 146/82 | HR 88 | Temp 98.2°F | Ht 74.0 in | Wt 222.6 lb

## 2019-03-21 DIAGNOSIS — M545 Low back pain, unspecified: Secondary | ICD-10-CM

## 2019-03-21 DIAGNOSIS — Z85038 Personal history of other malignant neoplasm of large intestine: Secondary | ICD-10-CM | POA: Diagnosis not present

## 2019-03-21 DIAGNOSIS — C189 Malignant neoplasm of colon, unspecified: Secondary | ICD-10-CM

## 2019-03-21 DIAGNOSIS — I10 Essential (primary) hypertension: Secondary | ICD-10-CM | POA: Diagnosis not present

## 2019-03-21 DIAGNOSIS — N529 Male erectile dysfunction, unspecified: Secondary | ICD-10-CM | POA: Diagnosis not present

## 2019-03-21 DIAGNOSIS — E782 Mixed hyperlipidemia: Secondary | ICD-10-CM | POA: Diagnosis not present

## 2019-03-21 MED ORDER — KETOROLAC TROMETHAMINE 30 MG/ML IJ SOLN
30.0000 mg | Freq: Once | INTRAMUSCULAR | Status: AC
  Administered 2019-03-21: 09:00:00 30 mg via INTRAMUSCULAR

## 2019-03-21 NOTE — Patient Instructions (Addendum)
Hypertension, Adult Hypertension is another name for high blood pressure. High blood pressure forces your heart to work harder to pump blood. This can cause problems over time. There are two numbers in a blood pressure reading. There is a top number (systolic) over a bottom number (diastolic). It is best to have a blood pressure that is below 120/80. Healthy choices can help lower your blood pressure, or you may need medicine to help lower it. What are the causes? The cause of this condition is not known. Some conditions may be related to high blood pressure. What increases the risk?  Smoking.  Having type 2 diabetes mellitus, high cholesterol, or both.  Not getting enough exercise or physical activity.  Being overweight.  Having too much fat, sugar, calories, or salt (sodium) in your diet.  Drinking too much alcohol.  Having long-term (chronic) kidney disease.  Having a family history of high blood pressure.  Age. Risk increases with age.  Race. You may be at higher risk if you are African American.  Gender. Men are at higher risk than women before age 45. After age 65, women are at higher risk than men.  Having obstructive sleep apnea.  Stress. What are the signs or symptoms?  High blood pressure may not cause symptoms. Very high blood pressure (hypertensive crisis) may cause: ? Headache. ? Feelings of worry or nervousness (anxiety). ? Shortness of breath. ? Nosebleed. ? A feeling of being sick to your stomach (nausea). ? Throwing up (vomiting). ? Changes in how you see. ? Very bad chest pain. ? Seizures. How is this treated?  This condition is treated by making healthy lifestyle changes, such as: ? Eating healthy foods. ? Exercising more. ? Drinking less alcohol.  Your health care provider may prescribe medicine if lifestyle changes are not enough to get your blood pressure under control, and if: ? Your top number is above 130. ? Your bottom number is above  80.  Your personal target blood pressure may vary. Follow these instructions at home: Eating and drinking   If told, follow the DASH eating plan. To follow this plan: ? Fill one half of your plate at each meal with fruits and vegetables. ? Fill one fourth of your plate at each meal with whole grains. Whole grains include whole-wheat pasta, brown rice, and whole-grain bread. ? Eat or drink low-fat dairy products, such as skim milk or low-fat yogurt. ? Fill one fourth of your plate at each meal with low-fat (lean) proteins. Low-fat proteins include fish, chicken without skin, eggs, beans, and tofu. ? Avoid fatty meat, cured and processed meat, or chicken with skin. ? Avoid pre-made or processed food.  Eat less than 1,500 mg of salt each day.  Do not drink alcohol if: ? Your doctor tells you not to drink. ? You are pregnant, may be pregnant, or are planning to become pregnant.  If you drink alcohol: ? Limit how much you use to:  0-1 drink a day for women.  0-2 drinks a day for men. ? Be aware of how much alcohol is in your drink. In the [place] one drink equals one 12 oz bottle of beer (355 mL), one 5 oz glass of wine (148 mL), or one 1 oz glass of hard liquor (44 mL). Lifestyle   Work with your doctor to stay at a healthy weight or to lose weight. Ask your doctor what the best weight is for you.  Get at least 30 minutes of exercise most   days of the week. This may include walking, swimming, or biking.  Get at least 30 minutes of exercise that strengthens your muscles (resistance exercise) at least 3 days a week. This may include lifting weights or doing Pilates.  Do not use any products that contain nicotine or tobacco, such as cigarettes, e-cigarettes, and chewing tobacco. If you need help quitting, ask your doctor.  Check your blood pressure at home as told by your doctor.  Keep all follow-up visits as told by your doctor. This is important. Medicines  Take over-the-counter  and prescription medicines only as told by your doctor. Follow directions carefully.  Do not skip doses of blood pressure medicine. The medicine does not work as well if you skip doses. Skipping doses also puts you at risk for problems.  Ask your doctor about side effects or reactions to medicines that you should watch for. Contact a doctor if you:  Think you are having a reaction to the medicine you are taking.  Have headaches that keep coming back (recurring).  Feel dizzy.  Have swelling in your ankles.  Have trouble with your vision. Get help right away if you:  Get a very bad headache.  Start to feel mixed up (confused).  Feel weak or numb.  Feel faint.  Have very bad pain in your: ? Chest. ? Belly (abdomen).  Throw up more than once.  Have trouble breathing. Summary  Hypertension is another name for high blood pressure.  High blood pressure forces your heart to work harder to pump blood.  For most people, a normal blood pressure is less than 120/80.  Making healthy choices can help lower blood pressure. If your blood pressure does not get lower with healthy choices, you may need to take medicine. This information is not intended to replace advice given to you by your health care provider. Make sure you discuss any questions you have with your health care provider. Document Revised: 10/06/2017 Document Reviewed: 10/06/2017 Elsevier Patient Education  2020 [place].   Back Exercises These exercises help to make your trunk and back strong. They also help to keep the lower back flexible. Doing these exercises can help to prevent back pain or lessen existing pain.  If you have back pain, try to do these exercises 2-3 times each day or as told by your doctor.  As you get better, do the exercises once each day. Repeat the exercises more often as told by your doctor.  To stop back pain from coming back, do the exercises once each day, or as told by your  doctor. Exercises Single knee to chest Do these steps 3-5 times in a row for each leg: 1. Lie on your back on a firm bed or the floor with your legs stretched out. 2. Bring one knee to your chest. 3. Grab your knee or thigh with both hands and hold them it in place. 4. Pull on your knee until you feel a gentle stretch in your lower back or buttocks. 5. Keep doing the stretch for 10-30 seconds. 6. Slowly let go of your leg and straighten it. Pelvic tilt Do these steps 5-10 times in a row: 1. Lie on your back on a firm bed or the floor with your legs stretched out. 2. Bend your knees so they point up to the ceiling. Your feet should be flat on the floor. 3. Tighten your lower belly (abdomen) muscles to press your lower back against the floor. This will make your tailbone  point up to the ceiling instead of pointing down to your feet or the floor. 4. Stay in this position for 5-10 seconds while you gently tighten your muscles and breathe evenly. Cat-cow Do these steps until your lower back bends more easily: 1. Get on your hands and knees on a firm surface. Keep your hands under your shoulders, and keep your knees under your hips. You may put padding under your knees. 2. Let your head hang down toward your chest. Tighten (contract) the muscles in your belly. Point your tailbone toward the floor so your lower back becomes rounded like the back of a cat. 3. Stay in this position for 5 seconds. 4. Slowly lift your head. Let the muscles of your belly relax. Point your tailbone up toward the ceiling so your back forms a sagging arch like the back of a cow. 5. Stay in this position for 5 seconds.  Press-ups Do these steps 5-10 times in a row: 1. Lie on your belly (face-down) on the floor. 2. Place your hands near your head, about shoulder-width apart. 3. While you keep your back relaxed and keep your hips on the floor, slowly straighten your arms to raise the top half of your body and lift your  shoulders. Do not use your back muscles. You may change where you place your hands in order to make yourself more comfortable. 4. Stay in this position for 5 seconds. 5. Slowly return to lying flat on the floor.  Bridges Do these steps 10 times in a row: 1. Lie on your back on a firm surface. 2. Bend your knees so they point up to the ceiling. Your feet should be flat on the floor. Your arms should be flat at your sides, next to your body. 3. Tighten your butt muscles and lift your butt off the floor until your waist is almost as high as your knees. If you do not feel the muscles working in your butt and the back of your thighs, slide your feet 1-2 inches farther away from your butt. 4. Stay in this position for 3-5 seconds. 5. Slowly lower your butt to the floor, and let your butt muscles relax. If this exercise is too easy, try doing it with your arms crossed over your chest. Belly crunches Do these steps 5-10 times in a row: 1. Lie on your back on a firm bed or the floor with your legs stretched out. 2. Bend your knees so they point up to the ceiling. Your feet should be flat on the floor. 3. Cross your arms over your chest. 4. Tip your chin a little bit toward your chest but do not bend your neck. 5. Tighten your belly muscles and slowly raise your chest just enough to lift your shoulder blades a tiny bit off of the floor. Avoid raising your body higher than that, because it can put too much stress on your low back. 6. Slowly lower your chest and your head to the floor. Back lifts Do these steps 5-10 times in a row: 1. Lie on your belly (face-down) with your arms at your sides, and rest your forehead on the floor. 2. Tighten the muscles in your legs and your butt. 3. Slowly lift your chest off of the floor while you keep your hips on the floor. Keep the back of your head in line with the curve in your back. Look at the floor while you do this. 4. Stay in this position for 3-5  seconds.  5. Slowly lower your chest and your face to the floor. Contact a doctor if:  Your back pain gets a lot worse when you do an exercise.  Your back pain does not get better 2 hours after you exercise. If you have any of these problems, stop doing the exercises. Do not do them again unless your doctor says it is okay. Get help right away if:  You have sudden, very bad back pain. If this happens, stop doing the exercises. Do not do them again unless your doctor says it is okay. This information is not intended to replace advice given to you by your health care provider. Make sure you discuss any questions you have with your health care provider. Document Revised: 10/21/2017 Document Reviewed: 10/21/2017 Elsevier Patient Education  2020 [employer].
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
## 2019-03-22 LAB — CMP14+EGFR
ALT: 42 IU/L (ref 0–44)
AST: 36 IU/L (ref 0–40)
Albumin/Globulin Ratio: 1.8 (ref 1.2–2.2)
Albumin: 4.6 g/dL (ref 3.7–4.7)
Alkaline Phosphatase: 76 IU/L (ref 39–117)
BUN/Creatinine Ratio: 8 — ABNORMAL LOW (ref 10–24)
BUN: 11 mg/dL (ref 8–27)
Bilirubin Total: 0.7 mg/dL (ref 0.0–1.2)
CO2: 24 mmol/L (ref 20–29)
Calcium: 9.5 mg/dL (ref 8.6–10.2)
Chloride: 102 mmol/L (ref 96–106)
Creatinine, Ser: 1.34 mg/dL — ABNORMAL HIGH (ref 0.76–1.27)
GFR calc Af Amer: 59 mL/min/{1.73_m2} — ABNORMAL LOW (ref >59)
GFR calc non Af Amer: 51 mL/min/{1.73_m2} — ABNORMAL LOW (ref >59)
Globulin, Total: 2.5 g/dL (ref 1.5–4.5)
Glucose: 97 mg/dL (ref 65–99)
Potassium: 4.1 mmol/L (ref 3.5–5.2)
Sodium: 140 mmol/L (ref 134–144)
Total Protein: 7.1 g/dL (ref 6.0–8.5)

## 2019-03-22 LAB — LIPID PANEL
Chol/HDL Ratio: 4.5 ratio (ref 0.0–5.0)
Cholesterol, Total: 135 mg/dL (ref 100–199)
HDL: 30 mg/dL — ABNORMAL LOW (ref >39)
LDL Chol Calc (NIH): 72 mg/dL (ref 0–99)
Triglycerides: 193 mg/dL — ABNORMAL HIGH (ref 0–149)
VLDL Cholesterol Cal: 33 mg/dL (ref 5–40)

## 2019-03-29 ENCOUNTER — Other Ambulatory Visit: Payer: Self-pay | Admitting: Nurse Practitioner

## 2019-03-29 DIAGNOSIS — N529 Male erectile dysfunction, unspecified: Secondary | ICD-10-CM

## 2019-04-03 ENCOUNTER — Encounter: Payer: Self-pay | Admitting: Nurse Practitioner

## 2019-04-10 ENCOUNTER — Telehealth: Payer: Self-pay

## 2019-04-10 NOTE — Telephone Encounter (Signed)
Called pt to inform him that his Motor vehicle renewal form is upfront ready for pickup

## 2019-04-20 ENCOUNTER — Other Ambulatory Visit: Payer: Self-pay | Admitting: Nurse Practitioner

## 2019-04-20 DIAGNOSIS — N529 Male erectile dysfunction, unspecified: Secondary | ICD-10-CM

## 2019-05-12 ENCOUNTER — Other Ambulatory Visit: Payer: Self-pay | Admitting: Nurse Practitioner

## 2019-05-12 DIAGNOSIS — N529 Male erectile dysfunction, unspecified: Secondary | ICD-10-CM

## 2019-05-28 ENCOUNTER — Other Ambulatory Visit: Payer: Self-pay | Admitting: Nurse Practitioner

## 2019-05-28 DIAGNOSIS — N529 Male erectile dysfunction, unspecified: Secondary | ICD-10-CM

## 2019-05-29 ENCOUNTER — Other Ambulatory Visit: Payer: Self-pay | Admitting: Nurse Practitioner

## 2019-05-29 DIAGNOSIS — N529 Male erectile dysfunction, unspecified: Secondary | ICD-10-CM

## 2019-06-01 ENCOUNTER — Other Ambulatory Visit: Payer: Self-pay | Admitting: Nurse Practitioner

## 2019-06-01 DIAGNOSIS — N529 Male erectile dysfunction, unspecified: Secondary | ICD-10-CM

## 2019-06-01 NOTE — Telephone Encounter (Signed)
Please refill patient's prescription 

## 2019-06-15 ENCOUNTER — Other Ambulatory Visit: Payer: Self-pay | Admitting: Nurse Practitioner

## 2019-06-15 DIAGNOSIS — N529 Male erectile dysfunction, unspecified: Secondary | ICD-10-CM

## 2019-07-05 ENCOUNTER — Other Ambulatory Visit: Payer: Self-pay | Admitting: Nurse Practitioner

## 2019-07-05 DIAGNOSIS — N529 Male erectile dysfunction, unspecified: Secondary | ICD-10-CM

## 2019-07-18 ENCOUNTER — Other Ambulatory Visit: Payer: Self-pay | Admitting: Nurse Practitioner

## 2019-07-18 DIAGNOSIS — N529 Male erectile dysfunction, unspecified: Secondary | ICD-10-CM

## 2019-07-18 NOTE — Telephone Encounter (Signed)
Refill request Sildenafil

## 2019-08-08 ENCOUNTER — Other Ambulatory Visit: Payer: Self-pay | Admitting: Nurse Practitioner

## 2019-08-08 DIAGNOSIS — E782 Mixed hyperlipidemia: Secondary | ICD-10-CM

## 2019-08-09 ENCOUNTER — Encounter: Payer: Self-pay | Admitting: Nurse Practitioner

## 2019-08-09 ENCOUNTER — Ambulatory Visit (INDEPENDENT_AMBULATORY_CARE_PROVIDER_SITE_OTHER): Payer: Medicare Other

## 2019-08-09 ENCOUNTER — Ambulatory Visit (INDEPENDENT_AMBULATORY_CARE_PROVIDER_SITE_OTHER): Payer: Medicare Other | Admitting: Nurse Practitioner

## 2019-08-09 ENCOUNTER — Other Ambulatory Visit: Payer: Self-pay

## 2019-08-09 VITALS — BP 144/82 | HR 75 | Temp 98.1°F | Ht 74.0 in | Wt 224.0 lb

## 2019-08-09 DIAGNOSIS — I10 Essential (primary) hypertension: Secondary | ICD-10-CM

## 2019-08-09 DIAGNOSIS — E782 Mixed hyperlipidemia: Secondary | ICD-10-CM

## 2019-08-09 DIAGNOSIS — N529 Male erectile dysfunction, unspecified: Secondary | ICD-10-CM

## 2019-08-09 DIAGNOSIS — Z1159 Encounter for screening for other viral diseases: Secondary | ICD-10-CM

## 2019-08-09 DIAGNOSIS — Z23 Encounter for immunization: Secondary | ICD-10-CM | POA: Diagnosis not present

## 2019-08-09 DIAGNOSIS — Z0000 Encounter for general adult medical examination without abnormal findings: Secondary | ICD-10-CM

## 2019-08-09 MED ORDER — METOPROLOL SUCCINATE ER 50 MG PO TB24: 50.0000 mg | ORAL_TABLET | Freq: Every day | ORAL | 1 refills | Status: DC

## 2019-08-09 MED ORDER — SHINGRIX 50 MCG/0.5ML IM SUSR: 0.5000 mL | Freq: Once | INTRAMUSCULAR | 0 refills | Status: AC

## 2019-08-09 MED ORDER — SILDENAFIL CITRATE 20 MG PO TABS: 20.0000 mg | ORAL_TABLET | Freq: Every day | ORAL | 5 refills | Status: DC

## 2019-08-09 NOTE — Progress Notes (Signed)
This visit occurred during the SARS-CoV-2 public health emergency.  Safety protocols were in place, including screening questions prior to the visit, additional usage of staff PPE, and extensive cleaning of exam room while observing appropriate contact time as indicated for disinfecting solutions.  Subjective:     Patient ID: Patrick Woodward , male    DOB: April 11, 1940 , 79 y.o.   MRN: [id number]   Chief Complaint  Patient presents with  . Hypertension    HPI  Hyperlipidemia - tolerating medications well.  Tries to avoid fried and fatty foods.     Hypertension This is a chronic problem. The current episode started more than 1 year ago. The problem has been gradually worsening since onset. The problem is uncontrolled. Pertinent negatives include no anxiety, chest pain, headaches or palpitations. There are no associated agents to hypertension. Risk factors for coronary artery disease include male gender and sedentary lifestyle. The current treatment provides no improvement. Compliance problems include diet (eats increased amounts of salt).  There is no history of angina or kidney disease. There is no history of chronic renal disease.     Past Medical History:  Diagnosis Date  . 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 GERD (gastroesophageal reflux disease)   . Hypercholesteremia   . Hypertension   . 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 Cancer Mother        colon  . Cancer Father        prostate     Current Outpatient Medications:  .  aspirin 325 MG EC tablet, Take 325 mg by mouth daily., Disp: , Rfl:  .  atorvastatin (LIPITOR) 20 MG tablet, Take 1 tablet by mouth once daily, Disp: 90 tablet, Rfl: 0 .  cholecalciferol (VITAMIN D) 1000 UNITS tablet, Take 2,000 Units by mouth daily., Disp: , Rfl:  .  metoprolol succinate (TOPROL-XL) 50 MG 24 hr tablet, Take 1 tablet (50 mg total)  by mouth daily., Disp: 90 tablet, Rfl: 1 .  Multiple Vitamin (MULTIVITAMIN PO), Take 1 tablet by mouth daily. , Disp: , Rfl:  .  sildenafil (REVATIO) 20 MG tablet, Take 1 tablet by mouth once daily, Disp: 30 tablet, Rfl: 0 .  vitamin E (VITAMIN E) 400 UNIT capsule, Take 400 Units by mouth daily.  , Disp: , Rfl:    Allergies  Allergen Reactions  . Procardia [Nifedipine] Other (See Comments)    irratic heart rate      Review of Systems  Constitutional: Negative.  Negative for fatigue.  Respiratory: Negative.   Cardiovascular: Negative for chest pain, palpitations and leg swelling.  Neurological: Negative for dizziness, light-headedness and headaches.  Psychiatric/Behavioral: Negative.      Today's Vitals   08/09/19 0857  BP: (!) 144/82  Pulse: 75  Temp: 98.1 F (36.7 C)  TempSrc: Oral  Weight: 224 lb (101.6 kg)  Height: _0  (1.88 m)  PainSc: 0-No pain   Body mass index is 28.76 kg/m.   Objective:  Physical Exam Vitals reviewed.  Constitutional:      General: He is not in acute distress.    Appearance: Normal appearance. He is obese.  Cardiovascular:     Rate and Rhythm: Normal rate and regular rhythm.     Pulses: Normal pulses.     Heart sounds: Normal heart sounds. No murmur heard.   Pulmonary:  Effort: No respiratory distress.     Breath sounds: Normal breath sounds.  Musculoskeletal:        General: No swelling.  Skin:    General: Skin is warm and dry.     Capillary Refill: Capillary refill takes less than 2 seconds.  Neurological:     General: No focal deficit present.     Mental Status: He is alert and oriented to person, place, and time.  Psychiatric:        Mood and Affect: Mood normal.        Behavior: Behavior normal.        Thought Content: Thought content normal.        Judgment: Judgment normal.         Assessment And Plan:     1. Essential hypertension . B/P is controlled.  . CMP ordered to check renal function.  . The importance of  regular exercise and dietary modification was stressed to the patient.  . Stressed importance of losing ten percent of her body weight to help with B/P control.  - BMP8+eGFR  2. Mixed hyperlipidemia  Chronic, slightly elevated today.  Continue with current medications  His blood pressure with the NP who seen him at home was 130/70, he feels his elevated blood pressure today is related to him being at the doctor office. - Lipid panel  3. Encounter for immunization  - Zoster Vaccine Adjuvanted [facility]) injection; Inject 0.5 mLs into the muscle once for 1 dose.  Dispense: 0.5 mL; Refill: 0  4. Encounter for hepatitis C screening test for low risk patient  Will check Hepatitis C screening due to recent recommendations to screen all adults 18 years and older - Hepatitis C antibody   [name], FNP    THE PATIENT IS ENCOURAGED TO PRACTICE SOCIAL DISTANCING DUE TO THE COVID-19 PANDEMIC.

## 2019-08-09 NOTE — Progress Notes (Signed)
This visit occurred during the SARS-CoV-2 public health emergency.  Safety protocols were in place, including screening questions prior to the visit, additional usage of staff PPE, and extensive cleaning of exam room while observing appropriate contact time as indicated for disinfecting solutions.  Subjective:   NEWEL OIEN is a 79 y.o. male who presents for Medicare Annual/Subsequent preventive examination.  Review of Systems     Cardiac Risk Factors include: hypertension;dyslipidemia;male gender;sedentary lifestyle     Objective:    Today's Vitals   08/09/19 0904  BP: (!) 144/82  Pulse: 75  Temp: 98.1 F (36.7 C)  TempSrc: Oral  Weight: 224 lb (101.6 kg)  Height: 6\' 2"  (1.88 m)   Body mass index is 28.76 kg/m.  Advanced Directives 08/09/2019 08/02/2018 06/07/2017 06/02/2016 12/03/2015 06/04/2015 12/04/2014  Does Patient Have a Medical Advance Directive? No No No No No No No  Would patient like information on creating a medical advance directive? - No - Patient declined - - No - patient declined information No - patient declined information No - patient declined information    Current Medications (verified) Outpatient Encounter Medications as of 08/09/2019  Medication Sig  . aspirin 325 MG EC tablet Take 325 mg by mouth daily.  Marland Kitchen atorvastatin (LIPITOR) 20 MG tablet Take 1 tablet by mouth once daily  . cholecalciferol (VITAMIN D) 1000 UNITS tablet Take 2,000 Units by mouth daily.  . metoprolol succinate (TOPROL-XL) 50 MG 24 hr tablet Take 1 tablet (50 mg total) by mouth daily.  . Multiple Vitamin (MULTIVITAMIN PO) Take 1 tablet by mouth daily.   . sildenafil (REVATIO) 20 MG tablet Take 1 tablet by mouth once daily  . vitamin E (VITAMIN E) 400 UNIT capsule Take 400 Units by mouth daily.     No facility-administered encounter medications on file as of 08/09/2019.    Allergies (verified) Procardia [nifedipine]   History: Past Medical History:  Diagnosis Date  . Colon  cancer ([place]) 10/2010   Stage III-mod differentiated adeno  . GERD (gastroesophageal reflux disease)   . Hypercholesteremia   . Hypertension   . 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 COLON SURGERY  10/20/10   colon resection w/colostomy/Hartmann pouch procedure  . COLONOSCOPY    . COLONOSCOPY N/A 08/28/2013   Procedure: COLONOSCOPY;  Surgeon: Lear Ng, MD;  Location: [facility];  Service: Endoscopy;  Laterality: N/A;  . COLOSTOMY CLOSURE  12/02/2011  . COLOSTOMY CLOSURE  12/02/2011   Procedure: COLOSTOMY CLOSURE;  Surgeon: Joyice Faster. Cornett, MD;  Location: [place];  Service: General;  Laterality: N/A;  . HOT HEMOSTASIS N/A 08/28/2013   Procedure: HOT HEMOSTASIS (ARGON PLASMA COAGULATION/BICAP);  Surgeon: Lear Ng, MD;  Location: [facility];  Service: Endoscopy;  Laterality: N/A;  . PORT-A-CATH REMOVAL  12/02/2011  . PORT-A-CATH REMOVAL  12/02/2011   Procedure: REMOVAL PORT-A-CATH;  Surgeon: Joyice Faster. Cornett, MD;  Location: [place];  Service: General;  Laterality: Left;  . PORTACATH PLACEMENT  11/27/10   Dr. Judeen Hammans chest   Family History  Problem Relation Age of Onset  . Cancer Mother        colon  . Cancer Father        prostate   Social History   Socioeconomic History  . Marital status: Divorced    Spouse name: Not on file  . Number of children: Not on file  . Years of education: Not  on file  . Highest education level: Not on file  Occupational History  . Occupation: retired  Tobacco Use  . Smoking status: Former Smoker    Packs/day: 1.00    Years: 20.00    Pack years: 20.00    Types: Cigarettes, Pipe, Cigars  . Smokeless tobacco: Never Used  . Tobacco comment: 12/02/2011 "stopped smoking ~ 30 yr ago"  Vaping Use  . Vaping Use: Never used  Substance and Sexual Activity  . Alcohol use: No  . Drug use: No  . Sexual activity: Yes  Other Topics Concern  . Not on file    Social History Narrative  . Not on file   Social Determinants of Health   Financial Resource Strain: Low Risk   . Difficulty of Paying Living Expenses: Not hard at all  Food Insecurity: No Food Insecurity  . Worried About Charity fundraiser in the Last Year: Never true  . Ran Out of Food in the Last Year: Never true  Transportation Needs: No Transportation Needs  . Lack of Transportation (Medical): No  . Lack of Transportation (Non-Medical): No  Physical Activity: Inactive  . Days of Exercise per Week: 0 days  . Minutes of Exercise per Session: 0 min  Stress: No Stress Concern Present  . Feeling of Stress : Not at all  Social Connections:   . Frequency of Communication with Friends and Family:   . Frequency of Social Gatherings with Friends and Family:   . Attends Religious Services:   . Active Member of Clubs or Organizations:   . Attends Archivist Meetings:   Marland Kitchen Marital Status:     Tobacco Counseling Counseling given: Not Answered Comment: 12/02/2011 "stopped smoking ~ 30 yr ago"   Clinical Intake:  Pre-visit preparation completed: Yes  Pain : No/denies pain     Nutritional Status: BMI 25 -29 Overweight Nutritional Risks: None Diabetes: No  How often do you need to have someone help you when you read instructions, pamphlets, or other written materials from your doctor or pharmacy?: 1 - Never What is the last grade level you completed in school?: 10th grade  Diabetic? no  Interpreter Needed?: No  Information entered by :: NAllen LPN   Activities of Daily Living In your present state of health, do you have any difficulty performing the following activities: 08/09/2019 08/09/2019  Hearing? N N  Vision? N N  Difficulty concentrating or making decisions? N N  Walking or climbing stairs? N N  Dressing or bathing? N N  Doing errands, shopping? N N  Preparing Food and eating ? N -  Using the Toilet? N -  In the past six months, have you accidently  leaked urine? N -  Do you have problems with loss of bowel control? N -  Managing your Medications? N -  Managing your Finances? N -  Housekeeping or managing your Housekeeping? N -  Some recent data might be hidden    Patient Care Team: [name], FNP as PCP - General ([place]) Warden Fillers, MD as Consulting Physician (Ophthalmology) Ladell Pier, MD as Consulting Physician (Oncology)  Indicate any recent Medical Services you may have received from other than [facility] providers in the past year (date may be approximate).     Assessment:   This is a routine wellness examination for Dandrae.  Hearing/Vision screen  Hearing Screening   125Hz  250Hz  500Hz  1000Hz  2000Hz  3000Hz  4000Hz  6000Hz  8000Hz   Right ear:  Left ear:           Vision Screening Comments: No regular eye exams. Dr. Katy Fitch  Dietary issues and exercise activities discussed: Current Exercise Habits: The patient does not participate in regular exercise at present  Goals    . Patient Stated     No goals    . Patient Stated     08/09/2019, no goals      Depression Screen PHQ 2/9 Scores 08/09/2019 11/17/2018 08/02/2018  PHQ - 2 Score 0 0 0  PHQ- 9 Score 0 - 0    Fall Risk Fall Risk  08/09/2019 11/17/2018 08/02/2018 12/04/2013  Falls in the past year? 0 0 0 No  Risk for fall due to : Medication side effect - Medication side effect -  Follow up Falls evaluation completed;Education provided;Falls prevention discussed - Education provided;Falls prevention discussed -    Any stairs in or around the home? Yes  If so, are there any without handrails? Yes  Home free of loose throw rugs in walkways, pet beds, electrical cords, etc? Yes  Adequate lighting in your home to reduce risk of falls? Yes   ASSISTIVE DEVICES UTILIZED TO PREVENT FALLS:  Life alert? No  Use of a cane, walker or w/c? No  Grab bars in the bathroom? Yes  Shower chair or bench in shower? Yes  Elevated toilet seat or a  handicapped toilet? No   TIMED UP AND GO:  Was the test performed? No .   Gait steady and fast without use of assistive device  Cognitive Function:     6CIT Screen 08/09/2019 08/02/2018  What Year? 0 points 0 points  What month? 0 points 0 points  What time? 0 points 0 points  Count back from 20 0 points 0 points  Months in reverse 4 points 0 points  Repeat phrase 2 points 0 points  Total Score 6 0    Immunizations Immunization History  Administered Date(s) Administered  . Fluad Quad(high Dose 65+) 10/26/2018  . Influenza Split 02/23/2011  . Influenza, High Dose Seasonal PF 10/30/2016  . Influenza-Unspecified 10/26/2011, 10/11/2013  . Moderna SARS-COVID-2 Vaccination 03/24/2019, 04/22/2019  . Pneumococcal Conjugate-13 11/04/2017  . Pneumococcal Polysaccharide-23 12/03/2011    TDAP status: Up to date Flu Vaccine status: Up to date Pneumococcal vaccine status: Up to date Covid-19 vaccine status: Completed vaccines  Qualifies for Shingles Vaccine? Yes   Zostavax completed No   Shingrix Completed?: No.    Education has been provided regarding the importance of this vaccine. Patient has been advised to call insurance company to determine out of pocket expense if they have not yet received this vaccine. Advised may also receive vaccine at local pharmacy or Health Dept. Verbalized acceptance and understanding.  Screening Tests Health Maintenance  Topic Date Due  . Hepatitis C Screening  Never done  . INFLUENZA VACCINE  09/10/2019  . TETANUS/TDAP  09/12/2019  . COVID-19 Vaccine  Completed  . PNA vac Low Risk Adult  Completed    Health Maintenance  Health Maintenance Due  Topic Date Due  . Hepatitis C Screening  Never done    Colorectal cancer screening: No longer required.   Lung Cancer Screening: (Low Dose CT Chest recommended if Age 24-80 years, 30 pack-year currently smoking OR have quit w/in 15years.) does not qualify.   Lung Cancer Screening Referral:  no  Additional Screening:  Hepatitis C Screening: does qualify; Completed today  Vision Screening: Recommended annual ophthalmology exams for early detection of glaucoma and  other disorders of the eye. Is the patient up to date with their annual eye exam?  No  Who is the provider or what is the name of the office in which the patient attends annual eye exams? Dr. Katy Fitch If pt is not established with a provider, would they like to be referred to a provider to establish care? No .   Dental Screening: Recommended annual dental exams for proper oral hygiene  Community Resource Referral / Chronic Care Management: CRR required this visit?  No   CCM required this visit?  No      Plan:     I have personally reviewed and noted the following in the patient's chart:   . Medical and social history . Use of alcohol, tobacco or illicit drugs  . Current medications and supplements . Functional ability and status . Nutritional status . Physical activity . Advanced directives . List of other physicians . Hospitalizations, surgeries, and ER visits in previous 12 months . Vitals . Screenings to include cognitive, depression, and falls . Referrals and appointments  In addition, I have reviewed and discussed with patient certain preventive protocols, quality metrics, and best practice recommendations. A written personalized care plan for preventive services as well as general preventive health recommendations were provided to patient.     Kellie Simmering, LPN   9/[id number]/[id number]   Nurse Notes:

## 2019-08-09 NOTE — Patient Instructions (Addendum)
Patrick Woodward , Thank you for taking time to come for your Medicare Wellness Visit. I appreciate your ongoing commitment to your health goals. Please review the following plan we discussed and let me know if I can assist you in the future.   Screening recommendations/referrals: Colonoscopy: not required Recommended yearly ophthalmology/optometry visit for glaucoma screening and checkup Recommended yearly dental visit for hygiene and checkup  Vaccinations: Influenza vaccine: due 09/10/2019 Pneumococcal vaccine: completed 11/04/2017 Tdap vaccine: completed 09/11/2009, due 09/12/2019 Shingles vaccine: discussed   Covid-19: 03/24/2019, 04/22/2019  Advanced directives: Advance directive discussed with you today. Even though you declined this today please call our office should you change your mind and we can give you the proper paperwork for you to fill out.   Conditions/risks identified: none  Next appointment:11/23/2019 at 8:45  Follow up in one year for your annual wellness visit.   Preventive Care 71 Years and Older, Male Preventive care refers to lifestyle choices and visits with your health care provider that can promote health and wellness. What does preventive care include?  A yearly physical exam. This is also called an annual well check.  Dental exams once or twice a year.  Routine eye exams. Ask your health care provider how often you should have your eyes checked.  Personal lifestyle choices, including:  Daily care of your teeth and gums.  Regular physical activity.  Eating a healthy diet.  Avoiding tobacco and drug use.  Limiting alcohol use.  Practicing safe sex.  Taking low doses of aspirin every day.  Taking vitamin and mineral supplements as recommended by your health care provider. What happens during an annual well check? The services and screenings done by your health care provider during your annual well check will depend on your age, overall health,  lifestyle risk factors, and family history of disease. Counseling  Your health care provider may ask you questions about your:  Alcohol use.  Tobacco use.  Drug use.  Emotional well-being.  Home and relationship well-being.  Sexual activity.  Eating habits.  History of falls.  Memory and ability to understand (cognition).  Work and work Statistician. Screening  You may have the following tests or measurements:  Height, weight, and BMI.  Blood pressure.  Lipid and cholesterol levels. These may be checked every 5 years, or more frequently if you are over 78 years old.  Skin check.  Lung cancer screening. You may have this screening every year starting at age 58 if you have a 30-pack-year history of smoking and currently smoke or have quit within the past 15 years.  Fecal occult blood test (FOBT) of the stool. You may have this test every year starting at age 27.  Flexible sigmoidoscopy or colonoscopy. You may have a sigmoidoscopy every 5 years or a colonoscopy every 10 years starting at age 2.  Prostate cancer screening. Recommendations will vary depending on your family history and other risks.  Hepatitis C blood test.  Hepatitis B blood test.  Sexually transmitted disease (STD) testing.  Diabetes screening. This is done by checking your blood sugar (glucose) after you have not eaten for a while (fasting). You may have this done every 1-3 years.  Abdominal aortic aneurysm (AAA) screening. You may need this if you are a current or former smoker.  Osteoporosis. You may be screened starting at age 21 if you are at high risk. Talk with your health care provider about your test results, treatment options, and if necessary, the need for more tests.  Vaccines  Your health care provider may recommend certain vaccines, such as:  Influenza vaccine. This is recommended every year.  Tetanus, diphtheria, and acellular pertussis (Tdap, Td) vaccine. You may need a Td booster  every 10 years.  Zoster vaccine. You may need this after age 45.  Pneumococcal 13-valent conjugate (PCV13) vaccine. One dose is recommended after age 84.  Pneumococcal polysaccharide (PPSV23) vaccine. One dose is recommended after age 36. Talk to your health care provider about which screenings and vaccines you need and how often you need them. This information is not intended to replace advice given to you by your health care provider. Make sure you discuss any questions you have with your health care provider. Document Released: 02/22/2015 Document Revised: 10/16/2015 Document Reviewed: 11/27/2014 Elsevier Interactive Patient Education  2017 [place] Prevention in the Home Falls can cause injuries. They can happen to people of all ages. There are many things you can do to make your home safe and to help prevent falls. What can I do on the outside of my home?  Regularly fix the edges of walkways and driveways and fix any cracks.  Remove anything that might make you trip as you walk through a door, such as a raised step or threshold.  Trim any bushes or trees on the path to your home.  Use bright outdoor lighting.  Clear any walking paths of anything that might make someone trip, such as rocks or tools.  Regularly check to see if handrails are loose or broken. Make sure that both sides of any steps have handrails.  Any raised decks and porches should have guardrails on the edges.  Have any leaves, snow, or ice cleared regularly.  Use sand or salt on walking paths during winter.  Clean up any spills in your garage right away. This includes oil or grease spills. What can I do in the bathroom?  Use night lights.  Install grab bars by the toilet and in the tub and shower. Do not use towel bars as grab bars.  Use non-skid mats or decals in the tub or shower.  If you need to sit down in the shower, use a plastic, non-slip stool.  Keep the floor dry. Clean up any  water that spills on the floor as soon as it happens.  Remove soap buildup in the tub or shower regularly.  Attach bath mats securely with double-sided non-slip rug tape.  Do not have throw rugs and other things on the floor that can make you trip. What can I do in the bedroom?  Use night lights.  Make sure that you have a light by your bed that is easy to reach.  Do not use any sheets or blankets that are too big for your bed. They should not hang down onto the floor.  Have a firm chair that has side arms. You can use this for support while you get dressed.  Do not have throw rugs and other things on the floor that can make you trip. What can I do in the kitchen?  Clean up any spills right away.  Avoid walking on wet floors.  Keep items that you use a lot in easy-to-reach places.  If you need to reach something above you, use a strong step stool that has a grab bar.  Keep electrical cords out of the way.  Do not use floor polish or wax that makes floors slippery. If you must use wax, use non-skid floor wax.  Do not have throw rugs and other things on the floor that can make you trip. What can I do with my stairs?  Do not leave any items on the stairs.  Make sure that there are handrails on both sides of the stairs and use them. Fix handrails that are broken or loose. Make sure that handrails are as long as the stairways.  Check any carpeting to make sure that it is firmly attached to the stairs. Fix any carpet that is loose or worn.  Avoid having throw rugs at the top or bottom of the stairs. If you do have throw rugs, attach them to the floor with carpet tape.  Make sure that you have a light switch at the top of the stairs and the bottom of the stairs. If you do not have them, ask someone to add them for you. What else can I do to help prevent falls?  Wear shoes that:  Do not have high heels.  Have rubber bottoms.  Are comfortable and fit you well.  Are closed  at the toe. Do not wear sandals.  If you use a stepladder:  Make sure that it is fully opened. Do not climb a closed stepladder.  Make sure that both sides of the stepladder are locked into place.  Ask someone to hold it for you, if possible.  Clearly mark and make sure that you can see:  Any grab bars or handrails.  First and last steps.  Where the edge of each step is.  Use tools that help you move around (mobility aids) if they are needed. These include:  Canes.  Walkers.  Scooters.  Crutches.  Turn on the lights when you go into a dark area. Replace any light bulbs as soon as they burn out.  Set up your furniture so you have a clear path. Avoid moving your furniture around.  If any of your floors are uneven, fix them.  If there are any pets around you, be aware of where they are.  Review your medicines with your doctor. Some medicines can make you feel dizzy. This can increase your chance of falling. Ask your doctor what other things that you can do to help prevent falls. This information is not intended to replace advice given to you by your health care provider. Make sure you discuss any questions you have with your health care provider. Document Released: 11/22/2008 Document Revised: 07/04/2015 Document Reviewed: 03/02/2014 Elsevier Interactive Patient Education  2017 [employer].

## 2019-08-10 LAB — LIPID PANEL
Chol/HDL Ratio: 5.6 ratio — ABNORMAL HIGH (ref 0.0–5.0)
Cholesterol, Total: 168 mg/dL (ref 100–199)
HDL: 30 mg/dL — ABNORMAL LOW (ref >39)
LDL Chol Calc (NIH): 99 mg/dL (ref 0–99)
Triglycerides: 227 mg/dL — ABNORMAL HIGH (ref 0–149)
VLDL Cholesterol Cal: 39 mg/dL (ref 5–40)

## 2019-08-10 LAB — BMP8+EGFR
BUN/Creatinine Ratio: 10 (ref 10–24)
BUN: 14 mg/dL (ref 8–27)
CO2: 21 mmol/L (ref 20–29)
Calcium: 9.4 mg/dL (ref 8.6–10.2)
Chloride: 102 mmol/L (ref 96–106)
Creatinine, Ser: 1.4 mg/dL — ABNORMAL HIGH (ref 0.76–1.27)
GFR calc Af Amer: 56 mL/min/{1.73_m2} — ABNORMAL LOW (ref >59)
GFR calc non Af Amer: 48 mL/min/{1.73_m2} — ABNORMAL LOW (ref >59)
Glucose: 92 mg/dL (ref 65–99)
Potassium: 4.4 mmol/L (ref 3.5–5.2)
Sodium: 139 mmol/L (ref 134–144)

## 2019-08-10 LAB — HEPATITIS C ANTIBODY: Hep C Virus Ab: <0.1 s/co ratio (ref 0.0–0.9)

## 2019-09-27 ENCOUNTER — Telehealth: Payer: Self-pay | Admitting: Nurse Practitioner

## 2019-09-27 NOTE — Telephone Encounter (Signed)
Rescheduled appointments per 8/2 provider message. Left message for patient with appointment date and time.

## 2019-10-25 ENCOUNTER — Telehealth: Payer: Self-pay | Admitting: *Deleted

## 2019-10-25 NOTE — Telephone Encounter (Signed)
Needs to drop off form to office for MD to sign and fax back to note he is still in remission from his cancer. 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 Requests completed form be faxed and he will note fax # when he drops form off. Can provide him w/original when he is here in October.

## 2019-10-31 NOTE — Telephone Encounter (Signed)
Patient dropped off his form on 10/30/19. Completed and faxed back to [facility] at [phone] w/confirmation of receipt on 10/31/19. Copy sent to scan and will give patient his original back at 10/13 visit.

## 2019-11-06 ENCOUNTER — Other Ambulatory Visit: Payer: Self-pay | Admitting: Nurse Practitioner

## 2019-11-06 DIAGNOSIS — E782 Mixed hyperlipidemia: Secondary | ICD-10-CM

## 2019-11-08 ENCOUNTER — Telehealth: Payer: Self-pay | Admitting: *Deleted

## 2019-11-08 NOTE — Telephone Encounter (Signed)
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 The local office has not received it. Called back and left VM that this form was sent on 9/21 to main office and faxed again today. The local fax # hand written on the paper does not work--will not go through. He will be given his original forms back at his 10/13 OV>

## 2019-11-21 ENCOUNTER — Ambulatory Visit: Payer: Medicare Other | Admitting: Nurse Practitioner

## 2019-11-21 ENCOUNTER — Other Ambulatory Visit: Payer: Medicare Other

## 2019-11-22 ENCOUNTER — Inpatient Hospital Stay (HOSPITAL_BASED_OUTPATIENT_CLINIC_OR_DEPARTMENT_OTHER): Payer: Medicare Other | Admitting: Nurse Practitioner

## 2019-11-22 ENCOUNTER — Inpatient Hospital Stay: Payer: Medicare Other | Attending: Nurse Practitioner

## 2019-11-22 ENCOUNTER — Other Ambulatory Visit: Payer: Self-pay | Admitting: *Deleted

## 2019-11-22 ENCOUNTER — Telehealth: Payer: Self-pay

## 2019-11-22 ENCOUNTER — Other Ambulatory Visit: Payer: Self-pay

## 2019-11-22 ENCOUNTER — Encounter: Payer: Self-pay | Admitting: Nurse Practitioner

## 2019-11-22 VITALS — BP 143/83 | HR 88 | Temp 97.8°F | Ht 74.0 in | Wt 219.9 lb

## 2019-11-22 DIAGNOSIS — D6959 Other secondary thrombocytopenia: Secondary | ICD-10-CM | POA: Diagnosis not present

## 2019-11-22 DIAGNOSIS — C189 Malignant neoplasm of colon, unspecified: Secondary | ICD-10-CM

## 2019-11-22 DIAGNOSIS — G620 Drug-induced polyneuropathy: Secondary | ICD-10-CM | POA: Diagnosis not present

## 2019-11-22 DIAGNOSIS — Z85038 Personal history of other malignant neoplasm of large intestine: Secondary | ICD-10-CM | POA: Insufficient documentation

## 2019-11-22 LAB — CEA (IN HOUSE-CHCC): CEA (CHCC-In House): 4.49 ng/mL (ref 0.00–5.00)

## 2019-11-22 NOTE — Telephone Encounter (Signed)
Called [facility] about patient's need for colonoscopy. Patient was sent a letter in July about scheduling an colonoscopy, patient did not response. Called patient and gave patient [facility] phone number to call and get an appointment to schedule an colonoscopy. Patient verbalized understanding and stated he would call [facility] and schedule an appointment.

## 2019-11-22 NOTE — Progress Notes (Signed)
[place] OFFICE PROGRESS NOTE   Diagnosis: Colon cancer  INTERVAL HISTORY:   Patrick Woodward returns as scheduled.  He feels well.  He continues to work.  Bowels moving regularly.  No bleeding.  No abdominal pain.  No nausea or vomiting.  He has a good appetite.  Objective:  Vital signs in last 24 hours:  Blood pressure (!) 143/83, pulse 88, temperature 97.8 F (36.6 C), temperature source Tympanic, height 6\' 2"  (1.88 m), weight 219 lb 14.4 oz (99.7 kg), SpO2 97 %.    HEENT: Neck without mass. Lymphatics: No palpable cervical, supraclavicular, axillary or inguinal lymph nodes. Resp: Lungs clear bilaterally. Cardio: Regular rate and rhythm. GI: Abdomen soft and nontender.  No hepatomegaly. Vascular: No leg edema.   Lab Results:  Lab Results  Component Value Date   WBC 5.4 07/18/2018   HGB 15.9 07/18/2018   HCT 49.3 07/18/2018   MCV 87.4 07/18/2018   PLT 153 07/18/2018   NEUTROABS 2.7 10/25/2013    Imaging:  No results found.  Medications: I have reviewed the patient's current medications.  Assessment/Plan: 1. 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 Status post cycle #1 adjuvant FOLFOX chemotherapy 12/01/2010. He completed cycle 2 beginning 12/15/2010. He completed cycle 3 beginning 01/05/2011 with Neulasta support. He completed cycle 11 on 05/04/2011. He completed the 12th and final cycle on 05/18/2011.   Negative colonoscopy 09/08/2011, benign polypoid lesion in the descending colon. Colonoscopy 08/28/2013 with multiple benign polyps removed.  Restaging CT scans 10/26/2012 with no evidence of metastatic disease.  Restaging CT scans 06/24/2013 with no evidence of recurrent disease  Surveillance colonoscopy 08/17/2016- 16 mm polyp in the cecum; 10 mm polyp in the ascending colon; 2 mm polyp in the ascending colon; 5 mm  polyp in the transverse colon.  Pathology on the cecal polyp-tubular adenoma, 1.6 cm; pathology on polyps descending/transverse colon-tubular adenomas.  Mildly elevated CEA 12/07/2016, stable mild elevation 01/07/2017, improved but continued mild elevation 02/23/2017; CT scans recommended, patient declined due to renal dysfunction 2. History of a bowel obstruction secondary to the primary colon mass. 3. History of abdominal pain/constipation secondary to the descending/sigmoid colon mass. 4. History of weight loss. Improved. 5. Status post Port-A-Cath placement 11/27/2010. Removed 12/02/2011. 6. History of neutropenia secondary to chemotherapy. Cycle 3 FOLFOX was held 12/29/2010 due to neutropenia. He received Neulasta with cycle #3 FOLFOX chemotherapy on 01/05/2011. He did not receive Neulasta following cycle 4. Cycle 5 was held for one week due to neutropenia. He received Neulasta with subsequent treatments. Neulasta was held beginning with cycle 10 since the oxaliplatin was discontinued. 7. Thrombocytopenia secondary to chemotherapy. Oxaliplatin was dose reduced beginning with cycle 7. 8. Back pain following cycle 3 FOLFOX. Likely related to Neulasta. 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 Improved.  10. Delayed nausea following cycle 7 of FOLFOX. Aloxi was added to the antiemetic regimen beginning with cycle 8 FOLFOX. He also began prophylactic Decadron on days 2 and 3 following chemotherapy with cycle 8. He did not have nausea following cycle 8 or cycle 9 FOLFOX 11. Colostomy reversal 12/02/2011. 12. Mild elevation of the creatinine 10/26/2012. Repeat value on 10/28/2012 stable at 1.5. He is followed by nephrology     Disposition: Patrick Woodward remains in clinical remission from colon cancer.  We will follow-up on the CEA from today.  He will return for a  CEA and follow-up visit in 1 year.    Patrick Woodward ANP/GNP-BC   11/22/2019  4:14  PM

## 2019-11-22 NOTE — Progress Notes (Signed)
CEA orders placed

## 2019-11-23 ENCOUNTER — Encounter: Payer: Self-pay | Admitting: Nurse Practitioner

## 2019-11-23 ENCOUNTER — Ambulatory Visit (INDEPENDENT_AMBULATORY_CARE_PROVIDER_SITE_OTHER): Payer: Medicare Other | Admitting: Nurse Practitioner

## 2019-11-23 ENCOUNTER — Telehealth: Payer: Self-pay | Admitting: Nurse Practitioner

## 2019-11-23 VITALS — BP 124/78 | HR 72 | Temp 98.3°F | Ht 74.0 in | Wt 219.6 lb

## 2019-11-23 DIAGNOSIS — N528 Other male erectile dysfunction: Secondary | ICD-10-CM

## 2019-11-23 DIAGNOSIS — Z85038 Personal history of other malignant neoplasm of large intestine: Secondary | ICD-10-CM

## 2019-11-23 DIAGNOSIS — I10 Essential (primary) hypertension: Secondary | ICD-10-CM | POA: Diagnosis not present

## 2019-11-23 DIAGNOSIS — Z0000 Encounter for general adult medical examination without abnormal findings: Secondary | ICD-10-CM

## 2019-11-23 DIAGNOSIS — Z23 Encounter for immunization: Secondary | ICD-10-CM

## 2019-11-23 DIAGNOSIS — N1831 Chronic kidney disease, stage 3a: Secondary | ICD-10-CM | POA: Diagnosis not present

## 2019-11-23 DIAGNOSIS — R351 Nocturia: Secondary | ICD-10-CM

## 2019-11-23 DIAGNOSIS — I129 Hypertensive chronic kidney disease with stage 1 through stage 4 chronic kidney disease, or unspecified chronic kidney disease: Secondary | ICD-10-CM | POA: Diagnosis not present

## 2019-11-23 DIAGNOSIS — N183 Chronic kidney disease, stage 3 unspecified: Secondary | ICD-10-CM

## 2019-11-23 DIAGNOSIS — Z79899 Other long term (current) drug therapy: Secondary | ICD-10-CM

## 2019-11-23 DIAGNOSIS — E782 Mixed hyperlipidemia: Secondary | ICD-10-CM | POA: Diagnosis not present

## 2019-11-23 LAB — POCT URINALYSIS DIPSTICK
Bilirubin, UA: NEGATIVE
Blood, UA: NEGATIVE
Glucose, UA: NEGATIVE
Ketones, UA: NEGATIVE
Leukocytes, UA: NEGATIVE
Nitrite, UA: NEGATIVE
Protein, UA: NEGATIVE
Spec Grav, UA: 1.015 (ref 1.010–1.025)
Urobilinogen, UA: 0.2 E.U./dL
pH, UA: 6 (ref 5.0–8.0)

## 2019-11-23 LAB — POCT UA - MICROALBUMIN
Albumin/Creatinine Ratio, Urine, POC: 30
Creatinine, POC: 200 mg/dL
Microalbumin Ur, POC: 30 mg/L

## 2019-11-23 MED ORDER — TETANUS-DIPHTH-ACELL PERTUSSIS 5-2.5-18.5 LF-MCG/0.5 IM SUSP: 0.5000 mL | Freq: Once | INTRAMUSCULAR | 0 refills | Status: AC

## 2019-11-23 NOTE — Telephone Encounter (Signed)
Scheduled appointments per 10/13 los. Spoke to patient who is aware of appointment date and time.

## 2019-11-23 NOTE — Patient Instructions (Signed)
Health Maintenance After Age 77 After age 77, you are at a higher risk for certain long-term diseases and infections as well as injuries from falls. Falls are a major cause of broken bones and head injuries in people who are older than age 77. Getting regular preventive care can help to keep you healthy and well. Preventive care includes getting regular testing and making lifestyle changes as recommended by your health care provider. Talk with your health care provider about:  Which screenings and tests you should have. A screening is a test that checks for a disease when you have no symptoms.  A diet and exercise plan that is right for you. What should I know about screenings and tests to prevent falls? Screening and testing are the best ways to find a health problem early. Early diagnosis and treatment give you the best chance of managing medical conditions that are common after age 77. Certain conditions and lifestyle choices may make you more likely to have a fall. Your health care provider may recommend:  Regular vision checks. Poor vision and conditions such as cataracts can make you more likely to have a fall. If you wear glasses, make sure to get your prescription updated if your vision changes.  Medicine review. Work with your health care provider to regularly review all of the medicines you are taking, including over-the-counter medicines. Ask your health care provider about any side effects that may make you more likely to have a fall. Tell your health care provider if any medicines that you take make you feel dizzy or sleepy.  Osteoporosis screening. Osteoporosis is a condition that causes the bones to get weaker. This can make the bones weak and cause them to break more easily.  Blood pressure screening. Blood pressure changes and medicines to control blood pressure can make you feel dizzy.  Strength and balance checks. Your health care provider may recommend certain tests to check your  strength and balance while standing, walking, or changing positions.  Foot health exam. Foot pain and numbness, as well as not wearing proper footwear, can make you more likely to have a fall.  Depression screening. You may be more likely to have a fall if you have a fear of falling, feel emotionally low, or feel unable to do activities that you used to do.  Alcohol use screening. Using too much alcohol can affect your balance and may make you more likely to have a fall. What actions can I take to lower my risk of falls? General instructions  Talk with your health care provider about your risks for falling. Tell your health care provider if: ? You fall. Be sure to tell your health care provider about all falls, even ones that seem minor. ? You feel dizzy, sleepy, or off-balance.  Take over-the-counter and prescription medicines only as told by your health care provider. These include any supplements.  Eat a healthy diet and maintain a healthy weight. A healthy diet includes low-fat dairy products, low-fat (lean) meats, and fiber from whole grains, beans, and lots of fruits and vegetables. Home safety  Remove any tripping hazards, such as rugs, cords, and clutter.  Install safety equipment such as grab bars in bathrooms and safety rails on stairs.  Keep rooms and walkways well-lit. Activity   Follow a regular exercise program to stay fit. This will help you maintain your balance. Ask your health care provider what types of exercise are appropriate for you.  If you need a cane or   walker, use it as recommended by your health care provider.  Wear supportive shoes that have nonskid soles. Lifestyle  Do not drink alcohol if your health care provider tells you not to drink.  If you drink alcohol, limit how much you have: ? 0-1 drink a day for women. ? 0-2 drinks a day for men.  Be aware of how much alcohol is in your drink. In the [place] one drink equals one typical bottle of beer (12  oz), one-half glass of wine (5 oz), or one shot of hard liquor (1 oz).  Do not use any products that contain nicotine or tobacco, such as cigarettes and e-cigarettes. If you need help quitting, ask your health care provider. Summary  Having a healthy lifestyle and getting preventive care can help to protect your health and wellness after age 77.  Screening and testing are the best way to find a health problem early and help you avoid having a fall. Early diagnosis and treatment give you the best chance for managing medical conditions that are more common for people who are older than age 77.  Falls are a major cause of broken bones and head injuries in people who are older than age 77. Take precautions to prevent a fall at home.  Work with your health care provider to learn what changes you can make to improve your health and wellness and to prevent falls. This information is not intended to replace advice given to you by your health care provider. Make sure you discuss any questions you have with your health care provider. Document Revised: 05/19/2018 Document Reviewed: 12/09/2016 Elsevier Patient Education  2020 [employer]  

## 2019-11-23 NOTE — Progress Notes (Addendum)
I,[employer] as a Education administrator for [employer], FNP.,have documented all relevant documentation on the behalf of Patrick Brine, FNP,as directed by  Patrick Brine, FNP while in the presence of Patrick Woodward, Patrick Woodward.  This visit occurred during the SARS-CoV-2 public health emergency.  Safety protocols were in place, including screening questions prior to the visit, additional usage of staff PPE, and extensive cleaning of exam room while observing appropriate contact time as indicated for disinfecting solutions.  Subjective:     Patient ID: Patrick Woodward , male    DOB: 11-27-1940 , 79 y.o.   MRN: [id number]   Chief Complaint  Patient presents with  . Annual Exam    HPI  Here for HM.  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    He has seen Dr. Meredeth Woodward (Nephrology) in the past for his chronic kidney disease.    Hypertension This is a chronic problem. The current episode started more than 1 year ago. The problem is controlled. Pertinent negatives include no anxiety. There are no associated agents to hypertension. Risk factors for coronary artery disease include male gender, sedentary lifestyle and dyslipidemia. Past treatments include beta blockers. Compliance problems include exercise.  Identifiable causes of hypertension include chronic renal disease.     Past Medical History:  Diagnosis Date  . Colon cancer ([place]) 10/2010   Stage III-mod differentiated adeno  . GERD (gastroesophageal reflux disease)   . Hypercholesteremia   . Hypertension   . 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 Cancer Mother        colon  . Cancer Father        prostate     Current Outpatient Medications:  .  aspirin 325 MG EC tablet, Take 325 mg by mouth daily., Disp: , Rfl:  .  atorvastatin (LIPITOR) 20 MG tablet, Take 1 tablet by mouth once daily, Disp: 90 tablet, Rfl: 2 .  cholecalciferol (VITAMIN D) 1000  UNITS tablet, Take 2,000 Units by mouth daily., Disp: , Rfl:  .  metoprolol succinate (TOPROL-XL) 50 MG 24 hr tablet, Take 1 tablet (50 mg total) by mouth daily., Disp: 90 tablet, Rfl: 1 .  Multiple Vitamin (MULTIVITAMIN PO), Take 1 tablet by mouth daily. , Disp: , Rfl:  .  sildenafil (REVATIO) 20 MG tablet, Take 1 tablet (20 mg total) by mouth daily., Disp: 30 tablet, Rfl: 5 .  vitamin E (VITAMIN E) 400 UNIT capsule, Take 400 Units by mouth daily.  , Disp: , Rfl:  .  Tdap (BOOSTRIX) 5-2.5-18.5 LF-MCG/0.5 injection, Inject 0.5 mLs into the muscle once for 1 dose., Disp: 0.5 mL, Rfl: 0   Allergies  Allergen Reactions  . Procardia [Nifedipine] Other (See Comments)    irratic heart rate      Men's preventive visit. Patient Health Questionnaire (PHQ-2) is    Clinical Support from 08/09/2019 in [facility]  PHQ-2 Total Score 0     Patient is on a Regular diet; he is trying to decrease his intake of pork. Limited exercise - will walk a little bit.  Marital status: Divorced. Relevant history for alcohol use is:  Social History   Substance and Sexual Activity  Alcohol Use No   Relevant history for tobacco use is:  Social History   Tobacco Use  Smoking Status Former Smoker  . Packs/day: 1.00  . Years: 20.00  .  Pack years: 20.00  . Types: Cigarettes, Pipe, Cigars  Smokeless Tobacco Never Used  Tobacco Comment   12/02/2011 "stopped smoking ~ 30 yr ago"  .   Review of Systems  Constitutional: Negative.   HENT: Negative.   Eyes: Negative.   Respiratory: Negative.   Cardiovascular: Negative.   Gastrointestinal: Negative.   Endocrine: Negative.   Genitourinary: Negative.   Musculoskeletal: Negative.   Skin: Negative.   Neurological: Negative.   Hematological: Negative.   Psychiatric/Behavioral: Negative.      Today's Vitals   11/23/19 0843  BP: 124/78  Pulse: 72  Temp: 98.3 F (36.8 C)  TempSrc: Oral  Weight: 219 lb 9.6 oz (99.6 kg)  Height: '6\' 2"'   (1.88 m)  PainSc: 0-No pain   Body mass index is 28.19 kg/m.   Objective:  Physical Exam Vitals reviewed.  Constitutional:      Appearance: Normal appearance. He is obese.  HENT:     Head: Normocephalic and atraumatic.     Right Ear: Tympanic membrane, ear canal and external ear normal. There is no impacted cerumen.     Left Ear: Tympanic membrane, ear canal and external ear normal. There is no impacted cerumen.  Cardiovascular:     Rate and Rhythm: Normal rate and regular rhythm.     Pulses: Normal pulses.     Heart sounds: Normal heart sounds. No murmur heard.   Pulmonary:     Effort: Pulmonary effort is normal. No respiratory distress.     Breath sounds: Normal breath sounds.  Abdominal:     General: Abdomen is flat. Bowel sounds are normal. There is no distension.     Palpations: Abdomen is soft.  Genitourinary:    Prostate: Normal.     Rectum: Guaiac result negative.  Musculoskeletal:        General: Normal range of motion.     Cervical back: Normal range of motion and neck supple.  Skin:    General: Skin is warm.     Capillary Refill: Capillary refill takes less than 2 seconds.     Comments: Healed surgical scar to mid and left lower abdomen.  Also has scattered hypopigmented areas to bilateral lower extremities.  Neurological:     General: No focal deficit present.     Mental Status: He is alert and oriented to person, place, and time.  Psychiatric:        Mood and Affect: Mood normal.        Behavior: Behavior normal.        Thought Content: Thought content normal.        Judgment: Judgment normal.          Assessment And Plan:    1. Health maintenance examination . Behavior modifications discussed and diet history reviewed.   . Pt will continue to exercise regularly and modify diet with low GI, plant based foods and decrease intake of processed foods.  . Recommend intake of daily multivitamin, Vitamin D, and calcium.  . Recommend for preventive  screenings, as well as recommend immunizations that include influenza, TDAP (sent Rx to pharmacy)  2. 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 B/P is well controlled.  . CMP ordered to check renal function.  . The importance of regular exercise and dietary modification was stressed to the patient.  . EKG done with NSR  He has seen a Nephrologist in the past, has been several years  Will check his kidney functions today and encouraged to stay  hydrated with water and to limit intake of sodas - POCT Urinalysis Dipstick (81002) - POCT UA - Microalbumin - EKG 12-Lead - CBC no Diff - CMP14+EGFR  3. Mixed hyperlipidemia  Chronic, stable  Tolerating medications well  Continue with avoiding a high fat diet - Lipid panel - CMP14+EGFR  4. Nocturia  Does get up 3-4 times a night to urinate  Will check PSA - PSA  5. Other long term (current) drug therapy - CBC no Diff  6. Encounter for immunization  Rx sent to pharmacy  Encouraged to wait 2 weeks apart from any vaccine and his covid booster when it is available  - Tdap (Vesta) 5-2.5-18.5 LF-MCG/0.5 injection; Inject 0.5 mLs into the muscle once for 1 dose.  Dispense: 0.5 mL; Refill: 0  7. History of colon cancer Continue follow up with Oncology, recent visit reports he is doing well  8. Other male erectile dysfunction Chronic, stable.    Patient was given opportunity to ask questions. Patient verbalized understanding of the plan and was able to repeat key elements of the plan. All questions were answered to their satisfaction.   Teola Bradley, FNP, have reviewed all documentation for this visit. The documentation on 11/23/19 for the exam, diagnosis, procedures, and orders are all accurate and complete.  THE PATIENT IS ENCOURAGED TO PRACTICE SOCIAL DISTANCING DUE TO THE COVID-19 PANDEMIC.

## 2019-11-24 LAB — CBC
Hematocrit: 47.9 % (ref 37.5–51.0)
Hemoglobin: 15.7 g/dL (ref 13.0–17.7)
MCH: 28 pg (ref 26.6–33.0)
MCHC: 32.8 g/dL (ref 31.5–35.7)
MCV: 85 fL (ref 79–97)
Platelets: 167 10*3/uL (ref 150–450)
RBC: 5.61 x10E6/uL (ref 4.14–5.80)
RDW: 15.2 % (ref 11.6–15.4)
WBC: 5.2 10*3/uL (ref 3.4–10.8)

## 2019-11-24 LAB — CMP14+EGFR
ALT: 27 IU/L (ref 0–44)
AST: 24 IU/L (ref 0–40)
Albumin/Globulin Ratio: 1.8 (ref 1.2–2.2)
Albumin: 4.7 g/dL (ref 3.7–4.7)
Alkaline Phosphatase: 83 IU/L (ref 44–121)
BUN/Creatinine Ratio: 9 — ABNORMAL LOW (ref 10–24)
BUN: 11 mg/dL (ref 8–27)
Bilirubin Total: 0.7 mg/dL (ref 0.0–1.2)
CO2: 22 mmol/L (ref 20–29)
Calcium: 9.3 mg/dL (ref 8.6–10.2)
Chloride: 102 mmol/L (ref 96–106)
Creatinine, Ser: 1.27 mg/dL (ref 0.76–1.27)
GFR calc Af Amer: 63 mL/min/{1.73_m2} (ref >59)
GFR calc non Af Amer: 55 mL/min/{1.73_m2} — ABNORMAL LOW (ref >59)
Globulin, Total: 2.6 g/dL (ref 1.5–4.5)
Glucose: 93 mg/dL (ref 65–99)
Potassium: 4.1 mmol/L (ref 3.5–5.2)
Sodium: 141 mmol/L (ref 134–144)
Total Protein: 7.3 g/dL (ref 6.0–8.5)

## 2019-11-24 LAB — PSA: Prostate Specific Ag, Serum: 0.9 ng/mL (ref 0.0–4.0)

## 2019-11-24 LAB — LIPID PANEL
Chol/HDL Ratio: 5.5 ratio — ABNORMAL HIGH (ref 0.0–5.0)
Cholesterol, Total: 177 mg/dL (ref 100–199)
HDL: 32 mg/dL — ABNORMAL LOW (ref >39)
LDL Chol Calc (NIH): 107 mg/dL — ABNORMAL HIGH (ref 0–99)
Triglycerides: 216 mg/dL — ABNORMAL HIGH (ref 0–149)
VLDL Cholesterol Cal: 38 mg/dL (ref 5–40)

## 2019-11-27 ENCOUNTER — Other Ambulatory Visit: Payer: Self-pay | Admitting: Nurse Practitioner

## 2019-11-27 DIAGNOSIS — N529 Male erectile dysfunction, unspecified: Secondary | ICD-10-CM

## 2019-11-27 NOTE — Telephone Encounter (Signed)
Sildenafil refill  

## 2019-11-30 ENCOUNTER — Other Ambulatory Visit: Payer: Self-pay | Admitting: Nurse Practitioner

## 2019-11-30 DIAGNOSIS — N529 Male erectile dysfunction, unspecified: Secondary | ICD-10-CM

## 2020-02-08 ENCOUNTER — Other Ambulatory Visit: Payer: Self-pay | Admitting: Nurse Practitioner

## 2020-02-08 DIAGNOSIS — I10 Essential (primary) hypertension: Secondary | ICD-10-CM

## 2020-02-25 ENCOUNTER — Other Ambulatory Visit: Payer: Self-pay | Admitting: Nurse Practitioner

## 2020-02-25 DIAGNOSIS — N529 Male erectile dysfunction, unspecified: Secondary | ICD-10-CM

## 2020-02-26 NOTE — Telephone Encounter (Signed)
Please refill patient's prescription YL,RMA 

## 2020-03-18 ENCOUNTER — Other Ambulatory Visit: Payer: Self-pay | Admitting: Nurse Practitioner

## 2020-03-18 DIAGNOSIS — N529 Male erectile dysfunction, unspecified: Secondary | ICD-10-CM

## 2020-03-18 NOTE — Telephone Encounter (Signed)
Sildenafil refill  

## 2020-03-25 ENCOUNTER — Telehealth: Payer: Self-pay

## 2020-03-25 NOTE — Telephone Encounter (Signed)
Patient called requesting a refill on his sildenafil to be sent to the pharmacy.  I returned his call and advised him that the med was sent on 03/18/20. Patrick Woodward

## 2020-04-23 ENCOUNTER — Other Ambulatory Visit: Payer: Self-pay | Admitting: Nurse Practitioner

## 2020-04-23 DIAGNOSIS — N529 Male erectile dysfunction, unspecified: Secondary | ICD-10-CM

## 2020-04-23 NOTE — Telephone Encounter (Signed)
Sildenafil refill

## 2020-05-14 ENCOUNTER — Other Ambulatory Visit: Payer: Self-pay | Admitting: Nurse Practitioner

## 2020-05-14 DIAGNOSIS — N529 Male erectile dysfunction, unspecified: Secondary | ICD-10-CM

## 2020-05-20 ENCOUNTER — Other Ambulatory Visit: Payer: Self-pay | Admitting: Nurse Practitioner

## 2020-05-20 DIAGNOSIS — I10 Essential (primary) hypertension: Secondary | ICD-10-CM

## 2020-05-22 ENCOUNTER — Ambulatory Visit (INDEPENDENT_AMBULATORY_CARE_PROVIDER_SITE_OTHER): Payer: Medicare Other | Admitting: Nurse Practitioner

## 2020-05-22 ENCOUNTER — Other Ambulatory Visit: Payer: Self-pay

## 2020-05-22 ENCOUNTER — Encounter: Payer: Self-pay | Admitting: Nurse Practitioner

## 2020-05-22 VITALS — BP 132/78 | HR 89 | Temp 98.3°F | Ht 74.0 in | Wt 212.8 lb

## 2020-05-22 DIAGNOSIS — I10 Essential (primary) hypertension: Secondary | ICD-10-CM

## 2020-05-22 DIAGNOSIS — H9191 Unspecified hearing loss, right ear: Secondary | ICD-10-CM | POA: Diagnosis not present

## 2020-05-22 DIAGNOSIS — N529 Male erectile dysfunction, unspecified: Secondary | ICD-10-CM

## 2020-05-22 DIAGNOSIS — Z23 Encounter for immunization: Secondary | ICD-10-CM | POA: Diagnosis not present

## 2020-05-22 DIAGNOSIS — E782 Mixed hyperlipidemia: Secondary | ICD-10-CM

## 2020-05-22 LAB — LIPID PANEL
Chol/HDL Ratio: 4.9 ratio (ref 0.0–5.0)
Cholesterol, Total: 151 mg/dL (ref 100–199)
HDL: 31 mg/dL — ABNORMAL LOW (ref >39)
LDL Chol Calc (NIH): 86 mg/dL (ref 0–99)
Triglycerides: 199 mg/dL — ABNORMAL HIGH (ref 0–149)
VLDL Cholesterol Cal: 34 mg/dL (ref 5–40)

## 2020-05-22 LAB — CMP14+EGFR
ALT: 32 IU/L (ref 0–44)
AST: 28 IU/L (ref 0–40)
Albumin/Globulin Ratio: 1.7 (ref 1.2–2.2)
Albumin: 4.3 g/dL (ref 3.7–4.7)
Alkaline Phosphatase: 76 IU/L (ref 44–121)
BUN/Creatinine Ratio: 11 (ref 10–24)
BUN: 14 mg/dL (ref 8–27)
Bilirubin Total: 0.7 mg/dL (ref 0.0–1.2)
CO2: 23 mmol/L (ref 20–29)
Calcium: 9.1 mg/dL (ref 8.6–10.2)
Chloride: 101 mmol/L (ref 96–106)
Creatinine, Ser: 1.27 mg/dL (ref 0.76–1.27)
Globulin, Total: 2.5 g/dL (ref 1.5–4.5)
Glucose: 99 mg/dL (ref 65–99)
Potassium: 4.2 mmol/L (ref 3.5–5.2)
Sodium: 139 mmol/L (ref 134–144)
Total Protein: 6.8 g/dL (ref 6.0–8.5)
eGFR: 58 mL/min/{1.73_m2} — ABNORMAL LOW (ref >59)

## 2020-05-22 MED ORDER — ATORVASTATIN CALCIUM 20 MG PO TABS: 1.0000 | ORAL_TABLET | Freq: Every day | ORAL | 1 refills | Status: DC

## 2020-05-22 MED ORDER — SILDENAFIL CITRATE 20 MG PO TABS: 20.0000 mg | ORAL_TABLET | Freq: Every day | ORAL | 5 refills | Status: DC

## 2020-05-22 MED ORDER — TETANUS-DIPHTH-ACELL PERTUSSIS 5-2.5-18.5 LF-MCG/0.5 IM SUSP: 0.5000 mL | Freq: Once | INTRAMUSCULAR | 0 refills | Status: AC

## 2020-05-22 NOTE — Progress Notes (Signed)
Rutherford Nail as a scribe for [name], FNP.,have documented all relevant documentation on the behalf of [name], FNP,as directed by  [name], FNP while in the presence of [name], [place]. This visit occurred during the SARS-CoV-2 public health emergency.  Safety protocols were in place, including screening questions prior to the visit, additional usage of staff PPE, and extensive cleaning of exam room while observing appropriate contact time as indicated for disinfecting solutions.  Subjective:     Patient ID: Patrick Woodward , male    DOB: December 25, 1940 , 80 y.o.   MRN: [id number]   Chief Complaint  Patient presents with  . Hypertension    HPI  Pt here today for hypertension f/u he has been compliant with all medications     Hypertension This is a chronic problem. The current episode started more than 1 year ago. The problem has been gradually worsening since onset. The problem is uncontrolled. Pertinent negatives include no anxiety, chest pain, headaches or palpitations. There are no associated agents to hypertension. Risk factors for coronary artery disease include male gender and sedentary lifestyle. The current treatment provides no improvement. Compliance problems include diet (eats increased amounts of salt).  There is no history of angina or kidney disease. There is no history of chronic renal disease.     Past Medical History:  Diagnosis Date  . 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 GERD (gastroesophageal reflux disease)   . Hypercholesteremia   . Hypertension   . 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 Cancer Mother        colon  . Cancer Father        prostate     Current Outpatient Medications:  .  aspirin 325 MG EC tablet, Take 325 mg by mouth daily., Disp: , Rfl:  .  cholecalciferol (VITAMIN D) 1000 UNITS tablet, Take 2,000 Units by  mouth daily., Disp: , Rfl:  .  metoprolol succinate (TOPROL-XL) 50 MG 24 hr tablet, Take 1 tablet by mouth once daily, Disp: 90 tablet, Rfl: 0 .  Multiple Vitamin (MULTIVITAMIN PO), Take 1 tablet by mouth daily., Disp: , Rfl:  .  Tdap (BOOSTRIX) 5-2.5-18.5 LF-MCG/0.5 injection, Inject 0.5 mLs into the muscle once for 1 dose., Disp: 0.5 mL, Rfl: 0 .  vitamin E 180 MG (400 UNITS) capsule, Take 400 Units by mouth daily., Disp: , Rfl:  .  atorvastatin (LIPITOR) 20 MG tablet, Take 1 tablet (20 mg total) by mouth daily., Disp: 90 tablet, Rfl: 1 .  sildenafil (REVATIO) 20 MG tablet, Take 1 tablet (20 mg total) by mouth daily., Disp: 30 tablet, Rfl: 5   Allergies  Allergen Reactions  . Procardia [Nifedipine] Other (See Comments)    irratic heart rate      Review of Systems  Constitutional: Negative.  Negative for fatigue.  HENT: Negative.   Respiratory: Negative.   Cardiovascular: Negative for chest pain, palpitations and leg swelling.  Endocrine: Negative for polydipsia, polyphagia and polyuria.  Musculoskeletal: Negative.   Skin: Negative.   Neurological: Negative for dizziness and headaches.  Psychiatric/Behavioral: Negative.      Today's Vitals   05/22/20 0858  BP: 132/78  Pulse: 89  Temp: 98.3 F (36.8 C)  TempSrc: Oral  Weight: 212 lb 12.8 oz (96.5 kg)  Height: '6\' 2"'  (1.88 m)  PainSc: 0-No pain  Body mass index is 27.32 kg/m.   Objective:  Physical Exam Vitals reviewed.  Constitutional:      General: He is not in acute distress.    Appearance: Normal appearance.     Comments: overweight  HENT:     Right Ear: Tympanic membrane, ear canal and external ear normal. There is no impacted cerumen.     Left Ear: Tympanic membrane and ear canal normal. There is no impacted cerumen.     Ears:     Comments: Gross hearing intact Cardiovascular:     Rate and Rhythm: Normal rate and regular rhythm.     Pulses: Normal pulses.     Heart sounds: Normal heart sounds. No murmur  heard.   Pulmonary:     Effort: Pulmonary effort is normal. No respiratory distress.     Breath sounds: Normal breath sounds. No wheezing.  Musculoskeletal:        General: No swelling.  Skin:    General: Skin is warm and dry.     Capillary Refill: Capillary refill takes less than 2 seconds.  Neurological:     General: No focal deficit present.     Mental Status: He is alert and oriented to person, place, and time.  Psychiatric:        Mood and Affect: Mood normal.        Behavior: Behavior normal.        Thought Content: Thought content normal.        Judgment: Judgment normal.         Assessment And Plan:     1. Essential hypertension . B/P is controlled.  . CMP ordered to check renal function.  . The importance of regular exercise and dietary modification was stressed to the patient.  - CMP14+EGFR  2. Mixed hyperlipidemia  Chronic, controlled  Continue with current medications, tolerating medications - atorvastatin (LIPITOR) 20 MG tablet; Take 1 tablet (20 mg total) by mouth daily.  Dispense: 90 tablet; Refill: 1 - Lipid panel - CMP14+EGFR  3. Erectile dysfunction, unspecified erectile dysfunction type  - sildenafil (REVATIO) 20 MG tablet; Take 1 tablet (20 mg total) by mouth daily.  Dispense: 30 tablet; Refill: 5  4. Hearing loss of right ear, unspecified hearing loss type  No abnormal findings on physical exam - Ambulatory referral to ENT  5. Encounter for immunization  Rx for Tdap sent to pharmacy.  TDAP will be administered to adults 18-55 years old every 10 years. - Tdap (BOOSTRIX) 5-2.5-18.5 LF-MCG/0.5 injection; Inject 0.5 mLs into the muscle once for 1 dose.  Dispense: 0.5 mL; Refill: 0     Patient was given opportunity to ask questions. Patient verbalized understanding of the plan and was able to repeat key elements of the plan. All questions were answered to their satisfaction.  [name], FNP   I, [name], FNP, have reviewed all  documentation for this visit. The documentation on 05/22/20 for the exam, diagnosis, procedures, and orders are all accurate and complete.   IF YOU HAVE BEEN REFERRED TO A SPECIALIST, IT MAY TAKE 1-2 WEEKS TO SCHEDULE/PROCESS THE REFERRAL. IF YOU HAVE NOT HEARD FROM US/SPECIALIST IN TWO WEEKS, PLEASE GIVE [place] A CALL AT [phone].   THE PATIENT IS ENCOURAGED TO PRACTICE SOCIAL DISTANCING DUE TO THE COVID-19 PANDEMIC.

## 2020-05-22 NOTE — Patient Instructions (Addendum)

## 2020-05-31 ENCOUNTER — Telehealth: Payer: Self-pay

## 2020-05-31 NOTE — Telephone Encounter (Signed)
Left the patient a message to call back for lab results. 

## 2020-05-31 NOTE — Telephone Encounter (Signed)
-----   Message from [name], [place] sent at 05/30/2020  1:49 PM EDT ----- Cholesterol levels are better however continue to limit the intake of breads and sweets.  Kidney functions are slightly declined make sure you are drinking adequate amounts of water. Liver functions are normal.

## 2020-06-13 ENCOUNTER — Other Ambulatory Visit: Payer: Self-pay | Admitting: Nurse Practitioner

## 2020-06-13 DIAGNOSIS — N529 Male erectile dysfunction, unspecified: Secondary | ICD-10-CM

## 2020-06-19 ENCOUNTER — Other Ambulatory Visit: Payer: Self-pay

## 2020-06-19 ENCOUNTER — Ambulatory Visit (INDEPENDENT_AMBULATORY_CARE_PROVIDER_SITE_OTHER): Payer: Medicare Other | Admitting: Otolaryngology

## 2020-06-19 VITALS — Temp 97.9°F

## 2020-06-19 DIAGNOSIS — H6122 Impacted cerumen, left ear: Secondary | ICD-10-CM | POA: Diagnosis not present

## 2020-06-19 NOTE — Progress Notes (Signed)
HPI: Patrick Woodward is a 80 y.o. male who presents is referred by his PCP for evaluation of hearing complaints.  A couple weeks ago he was having hearing problems in the left ear.  He uses an ear pot in that ear.  He has not had a problem with hearing on the right side.  He used some sweet oil in the left ear and this seems to have improved the hearing.  He presents today to have his ears checked and cleaned.Marland Kitchen He is not having a hearing problem today.  Past Medical History:  Diagnosis Date  . 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 GERD (gastroesophageal reflux disease)   . Hypercholesteremia   . Hypertension   . 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 COLON SURGERY  10/20/10   colon resection w/colostomy/Hartmann pouch procedure  . COLONOSCOPY    . COLONOSCOPY N/A 08/28/2013   Procedure: COLONOSCOPY;  Surgeon: Lear Ng, MD;  Location: [facility];  Service: Endoscopy;  Laterality: N/A;  . COLOSTOMY CLOSURE  12/02/2011  . COLOSTOMY CLOSURE  12/02/2011   Procedure: COLOSTOMY CLOSURE;  Surgeon: Joyice Faster. Cornett, MD;  Location: [facility];  Service: General;  Laterality: N/A;  . HOT HEMOSTASIS N/A 08/28/2013   Procedure: HOT HEMOSTASIS (ARGON PLASMA COAGULATION/BICAP);  Surgeon: Lear Ng, MD;  Location: [facility];  Service: Endoscopy;  Laterality: N/A;  . PORT-A-CATH REMOVAL  12/02/2011  . PORT-A-CATH REMOVAL  12/02/2011   Procedure: REMOVAL PORT-A-CATH;  Surgeon: Joyice Faster. Cornett, MD;  Location: [facility];  Service: General;  Laterality: Left;  . PORTACATH PLACEMENT  11/27/10   Dr. Judeen Hammans chest   Social History   Socioeconomic History  . Marital status: Divorced    Spouse name: Not on file  . Number of children: Not on file  . Years of education: Not on file  . Highest education level: Not on file  Occupational History  . Occupation: retired  Tobacco Use   . Smoking status: Former Smoker    Packs/day: 1.00    Years: 20.00    Pack years: 20.00    Types: Cigarettes, Pipe, Cigars  . Smokeless tobacco: Never Used  . Tobacco comment: 12/02/2011 "stopped smoking ~ 30 yr ago"  Vaping Use  . Vaping Use: Never used  Substance and Sexual Activity  . Alcohol use: No  . Drug use: No  . Sexual activity: Yes  Other Topics Concern  . Not on file  Social History Narrative  . Not on file   Social Determinants of Health   Financial Resource Strain: Low Risk   . Difficulty of Paying Living Expenses: Not hard at all  Food Insecurity: No Food Insecurity  . Worried About Charity fundraiser in the Last Year: Never true  . Ran Out of Food in the Last Year: Never true  Transportation Needs: No Transportation Needs  . Lack of Transportation (Medical): No  . Lack of Transportation (Non-Medical): No  Physical Activity: Inactive  . Days of Exercise per Week: 0 days  . Minutes of Exercise per Session: 0 min  Stress: No Stress Concern Present  . Feeling of Stress : Not at all  Social Connections: Not on file   Family History  Problem Relation Age of Onset  . Cancer Mother        colon  . Cancer Father  prostate   Allergies  Allergen Reactions  . Procardia [Nifedipine] Other (See Comments)    irratic heart rate    Prior to Admission medications   Medication Sig Start Date End Date Taking? Authorizing Provider  aspirin 325 MG EC tablet Take 325 mg by mouth daily.    [provider]  atorvastatin (LIPITOR) 20 MG tablet Take 1 tablet (20 mg total) by mouth daily. 05/22/20   [name], FNP  cholecalciferol (VITAMIN D) 1000 UNITS tablet Take 2,000 Units by mouth daily.    [provider]  metoprolol succinate (TOPROL-XL) 50 MG 24 hr tablet Take 1 tablet by mouth once daily 05/20/20   [name], FNP  Multiple Vitamin (MULTIVITAMIN PO) Take 1 tablet by mouth daily.    [provider]  sildenafil (REVATIO) 20 MG  tablet Take 1 tablet by mouth once daily 06/14/20   [name], FNP  vitamin E 180 MG (400 UNITS) capsule Take 400 Units by mouth daily.    [provider]     Positive ROS: Otherwise negative  All other systems have been reviewed and were otherwise negative with the exception of those mentioned in the HPI and as above.  Physical Exam: Constitutional: Alert, well-appearing, no acute distress Ears: External ears without lesions or tenderness.  Right ear canal had minimal wax buildup in it that was nonobstructing and this was removed.  The left ear canal and left TM are clear.  Both TMs were clear with good mobility on pneumatic otoscopy.  Hearing screening with a 512 1024 tuning fork revealed symmetric hearing in both ears.  And subjectively had good hearing in both ears with use of the 1024 tuning fork frequency. Nasal: External nose without lesions.. Clear nasal passages Oral: Lips and gums without lesions. Tongue and palate mucosa without lesions. Posterior oropharynx clear. Neck: No palpable adenopathy or masses Respiratory: Breathing comfortably  Skin: No facial/neck lesions or rash noted.  Procedures  Assessment: Suspect patient had wax buildup in the left ear that was cleaned by himself with use of the sweet oil. TMs are clear bilaterally and on hearing screening with a tuning forks he had good hearing in both ears.  This was symmetric.  Plan: Patient was scheduled for hearing test today but he felt like his hearing was doing well and did not want to have a hearing test today.  On clinical exam he does not appear to have any significant hearing loss.   Radene Journey, MD   CC:

## 2020-08-02 ENCOUNTER — Other Ambulatory Visit: Payer: Self-pay | Admitting: Nurse Practitioner

## 2020-08-02 DIAGNOSIS — I10 Essential (primary) hypertension: Secondary | ICD-10-CM

## 2020-08-14 ENCOUNTER — Other Ambulatory Visit: Payer: Self-pay

## 2020-08-14 ENCOUNTER — Encounter: Payer: Self-pay | Admitting: Nurse Practitioner

## 2020-08-14 ENCOUNTER — Ambulatory Visit (INDEPENDENT_AMBULATORY_CARE_PROVIDER_SITE_OTHER): Payer: Medicare Other | Admitting: Nurse Practitioner

## 2020-08-14 ENCOUNTER — Ambulatory Visit (INDEPENDENT_AMBULATORY_CARE_PROVIDER_SITE_OTHER): Payer: Medicare Other

## 2020-08-14 VITALS — BP 122/78 | HR 76 | Temp 98.5°F | Ht 74.0 in | Wt 210.0 lb

## 2020-08-14 VITALS — BP 122/78 | HR 76 | Temp 98.5°F | Ht 74.0 in | Wt 210.8 lb

## 2020-08-14 DIAGNOSIS — Z0000 Encounter for general adult medical examination without abnormal findings: Secondary | ICD-10-CM

## 2020-08-14 DIAGNOSIS — Z23 Encounter for immunization: Secondary | ICD-10-CM

## 2020-08-14 DIAGNOSIS — N289 Disorder of kidney and ureter, unspecified: Secondary | ICD-10-CM

## 2020-08-14 DIAGNOSIS — E782 Mixed hyperlipidemia: Secondary | ICD-10-CM

## 2020-08-14 LAB — LIPID PANEL
Chol/HDL Ratio: 4.3 ratio (ref 0.0–5.0)
Cholesterol, Total: 139 mg/dL (ref 100–199)
HDL: 32 mg/dL — ABNORMAL LOW (ref >39)
LDL Chol Calc (NIH): 77 mg/dL (ref 0–99)
Triglycerides: 173 mg/dL — ABNORMAL HIGH (ref 0–149)
VLDL Cholesterol Cal: 30 mg/dL (ref 5–40)

## 2020-08-14 LAB — CMP14+EGFR
ALT: 30 IU/L (ref 0–44)
AST: 28 IU/L (ref 0–40)
Albumin/Globulin Ratio: 2 (ref 1.2–2.2)
Albumin: 4.7 g/dL (ref 3.7–4.7)
Alkaline Phosphatase: 89 IU/L (ref 44–121)
BUN/Creatinine Ratio: 11 (ref 10–24)
BUN: 14 mg/dL (ref 8–27)
Bilirubin Total: 0.6 mg/dL (ref 0.0–1.2)
CO2: 22 mmol/L (ref 20–29)
Calcium: 9.4 mg/dL (ref 8.6–10.2)
Chloride: 101 mmol/L (ref 96–106)
Creatinine, Ser: 1.31 mg/dL — ABNORMAL HIGH (ref 0.76–1.27)
Globulin, Total: 2.4 g/dL (ref 1.5–4.5)
Glucose: 104 mg/dL — ABNORMAL HIGH (ref 65–99)
Potassium: 4.6 mmol/L (ref 3.5–5.2)
Sodium: 138 mmol/L (ref 134–144)
Total Protein: 7.1 g/dL (ref 6.0–8.5)
eGFR: 56 mL/min/{1.73_m2} — ABNORMAL LOW (ref >59)

## 2020-08-14 MED ORDER — BOOSTRIX 5-2.5-18.5 LF-MCG/0.5 IM SUSP: 0.5000 mL | Freq: Once | INTRAMUSCULAR | 0 refills | Status: AC

## 2020-08-14 NOTE — Progress Notes (Signed)
This visit occurred during the SARS-CoV-2 public health emergency.  Safety protocols were in place, including screening questions prior to the visit, additional usage of staff PPE, and extensive cleaning of exam room while observing appropriate contact time as indicated for disinfecting solutions.  Subjective:   Patrick Woodward is a 80 y.o. male who presents for Medicare Annual/Subsequent preventive examination.  Review of Systems     Cardiac Risk Factors include: advanced age (>47men, >33 women);dyslipidemia;hypertension;male gender;sedentary lifestyle     Objective:    Today's Vitals   08/14/20 0925  BP: 122/78  Pulse: 76  Temp: 98.5 F (36.9 C)  TempSrc: Oral  Weight: 210 lb (95.3 kg)  Height: 6\' 2"  (1.88 m)   Body mass index is 26.96 kg/m.  Advanced Directives 08/14/2020 08/09/2019 08/02/2018 06/07/2017 06/02/2016 12/03/2015 06/04/2015  Does Patient Have a Medical Advance Directive? No No No No No No No  Would patient like information on creating a medical advance directive? No - Patient declined - No - Patient declined - - No - patient declined information No - patient declined information    Current Medications (verified) Outpatient Encounter Medications as of 08/14/2020  Medication Sig   Tdap (BOOSTRIX) 5-2.5-18.5 LF-MCG/0.5 injection Inject 0.5 mLs into the muscle once for 1 dose.   aspirin 325 MG EC tablet Take 325 mg by mouth daily.   atorvastatin (LIPITOR) 20 MG tablet Take 1 tablet (20 mg total) by mouth daily.   cholecalciferol (VITAMIN D) 1000 UNITS tablet Take 2,000 Units by mouth daily.   metoprolol succinate (TOPROL-XL) 50 MG 24 hr tablet Take 1 tablet by mouth once daily   Multiple Vitamin (MULTIVITAMIN PO) Take 1 tablet by mouth daily.   sildenafil (REVATIO) 20 MG tablet Take 1 tablet by mouth once daily   vitamin E 180 MG (400 UNITS) capsule Take 400 Units by mouth daily.   No facility-administered encounter medications on file as of 08/14/2020.     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 Cornett, MD;  Location: [place];  Service: General;  Laterality: N/A;   HOT HEMOSTASIS N/A 08/28/2013   Procedure: HOT HEMOSTASIS (ARGON PLASMA COAGULATION/BICAP);  Surgeon: Lear Ng, MD;  Location: [facility];  Service: Endoscopy;  Laterality: N/A;   PORT-A-CATH REMOVAL  12/02/2011   PORT-A-CATH REMOVAL  12/02/2011   Procedure: REMOVAL PORT-A-CATH;  Surgeon: Joyice Faster. Cornett, MD;  Location: [facility];  Service: General;  Laterality: Left;   PORTACATH PLACEMENT  11/27/10   Dr. Judeen Hammans chest   Family History  Problem Relation Age of Onset   Cancer Mother        colon   Cancer Father        prostate   Social History   Socioeconomic History   Marital status: Divorced    Spouse name: Not  on file   Number of children: Not on file   Years of education: Not on file   Highest education level: Not on file  Occupational History   Occupation: retired  Tobacco Use   Smoking status: Former    Packs/day: 1.00    Years: 20.00    Pack years: 20.00    Types: Cigarettes, Pipe, Cigars   Smokeless tobacco: Never   Tobacco comments:    12/02/2011 "stopped smoking ~ 30 yr ago"  Vaping Use   Vaping Use: Never used  Substance and Sexual Activity   Alcohol use: No    Drug use: No   Sexual activity: Yes  Other Topics Concern   Not on file  Social History Narrative   Not on file   Social Determinants of Health   Financial Resource Strain: Low Risk    Difficulty of Paying Living Expenses: Not hard at all  Food Insecurity: No Food Insecurity   Worried About Charity fundraiser in the Last Year: Never true   [place] in the Last Year: Never true  Transportation Needs: No Transportation Needs   Lack of Transportation (Medical): No   Lack of Transportation (Non-Medical): No  Physical Activity: Inactive   Days of Exercise per Week: 0 days   Minutes of Exercise per Session: 0 min  Stress: No Stress Concern Present   Feeling of Stress : Not at all  Social Connections: Not on file    Tobacco Counseling Counseling given: Not Answered Tobacco comments: 12/02/2011 "stopped smoking ~ 30 yr ago"   Clinical Intake:  Pre-visit preparation completed: Yes  Pain : No/denies pain     Nutritional Status: BMI 25 -29 Overweight Nutritional Risks: None Diabetes: No  How often do you need to have someone help you when you read instructions, pamphlets, or other written materials from your doctor or pharmacy?: 1 - Never What is the last grade level you completed in school?: 9th grade  Diabetic? no  Interpreter Needed?: No  Information entered by :: NAllen LPN   Activities of Daily Living In your present state of health, do you have any difficulty performing the following activities: 08/14/2020 08/14/2020  Hearing? N N  Vision? N N  Difficulty concentrating or making decisions? N N  Walking or climbing stairs? N N  Dressing or bathing? N N  Doing errands, shopping? N N  Preparing Food and eating ? N -  Using the Toilet? N -  In the past six months, have you accidently leaked urine? N -  Do you have problems with loss of bowel control? N -  Managing your Medications? N -  Managing your Finances? N -  Housekeeping or managing your  Housekeeping? N -  Some recent data might be hidden    Patient Care Team: [name], FNP as PCP - General (General Practice) Warden Fillers, MD as Consulting Physician (Ophthalmology) Ladell Pier, MD as Consulting Physician (Oncology) Donato Heinz, MD as Consulting Physician (Nephrology)  Indicate any recent Medical Services you may have received from other than [facility] providers in the past year (date may be approximate).     Assessment:   This is a routine wellness examination for Patrick Woodward.  Hearing/Vision screen Vision Screening - Comments:: No regular eye exams, Dr. Katy Fitch  Dietary issues and exercise activities discussed: Current Exercise Habits: The patient does not participate in regular exercise at present   Goals Addressed  This Visit's Progress    Patient Stated       08/14/2020, stay healthy        Depression Screen PHQ 2/9 Scores 08/14/2020 08/14/2020 08/09/2019 11/17/2018 08/02/2018  PHQ - 2 Score 0 0 0 0 0  PHQ- 9 Score - - 0 - 0    Fall Risk Fall Risk  08/14/2020 08/14/2020 08/09/2019 11/17/2018 08/02/2018  Falls in the past year? 0 0 0 0 0  Number falls in past yr: - 0 - - -  Injury with Fall? - 0 - - -  Risk for fall due to : Medication side effect No Fall Risks Medication side effect - Medication side effect  Follow up Falls evaluation completed;Education provided;Falls prevention discussed - Falls evaluation completed;Education provided;Falls prevention discussed - Education provided;Falls prevention discussed    FALL RISK PREVENTION PERTAINING TO THE HOME:  Any stairs in or around the home? Yes  If so, are there any without handrails? No  Home free of loose throw rugs in walkways, pet beds, electrical cords, etc? Yes  Adequate lighting in your home to reduce risk of falls? Yes   ASSISTIVE DEVICES UTILIZED TO PREVENT FALLS:  Life alert? No  Use of a cane, walker or w/c? No  Grab bars in the bathroom? No  Shower chair or bench in  shower? Yes  Elevated toilet seat or a handicapped toilet? Yes   TIMED UP AND GO:  Was the test performed? No .    Gait steady and fast without use of assistive device  Cognitive Function:     6CIT Screen 08/14/2020 08/09/2019 08/02/2018  What Year? 0 points 0 points 0 points  What month? 0 points 0 points 0 points  What time? 0 points 0 points 0 points  Count back from 20 0 points 0 points 0 points  Months in reverse 2 points 4 points 0 points  Repeat phrase 4 points 2 points 0 points  Total Score 6 6 0    Immunizations Immunization History  Administered Date(s) Administered   Fluad Quad(high Dose 65+) 10/26/2018   Influenza Split 02/23/2011   Influenza, High Dose Seasonal PF 10/30/2016   Influenza-Unspecified 10/26/2011, 10/11/2013, 11/10/2019   Moderna Sars-Covid-2 Vaccination 03/24/2019, 04/22/2019, 12/06/2019   Pneumococcal Conjugate-13 11/04/2017   Pneumococcal Polysaccharide-23 12/03/2011    TDAP status: Due, Education has been provided regarding the importance of this vaccine. Advised may receive this vaccine at local pharmacy or Health Dept. Aware to provide a copy of the vaccination record if obtained from local pharmacy or Health Dept. Verbalized acceptance and understanding.  Flu Vaccine status: Up to date  Pneumococcal vaccine status: Up to date  Covid-19 vaccine status: Completed vaccines  Qualifies for Shingles Vaccine? Yes   Zostavax completed No   Shingrix Completed?: No.    Education has been provided regarding the importance of this vaccine. Patient has been advised to call insurance company to determine out of pocket expense if they have not yet received this vaccine. Advised may also receive vaccine at local pharmacy or Health Dept. Verbalized acceptance and understanding.  Screening Tests Health Maintenance  Topic Date Due   TETANUS/TDAP  09/12/2019   COVID-19 Vaccine (4 - Booster for Moderna series) 03/07/2020   Zoster Vaccines- Shingrix (1 of 2)  11/14/2020 (Originally 12/05/1959)   INFLUENZA VACCINE  09/09/2020   Hepatitis C Screening  Completed   PNA vac Low Risk Adult  Completed   HPV VACCINES  Aged Out    Health Maintenance  Health  Maintenance Due  Topic Date Due   TETANUS/TDAP  09/12/2019   COVID-19 Vaccine (4 - Booster for Moderna series) 03/07/2020    Colorectal cancer screening: No longer required.   Lung Cancer Screening: (Low Dose CT Chest recommended if Age 30-80 years, 30 pack-year currently smoking OR have quit w/in 15years.) does not qualify.   Lung Cancer Screening Referral: no  Additional Screening:  Hepatitis C Screening: does qualify; Completed 08/09/2019  Vision Screening: Recommended annual ophthalmology exams for early detection of glaucoma and other disorders of the eye. Is the patient up to date with their annual eye exam?  No  Who is the provider or what is the name of the office in which the patient attends annual eye exams? Dr. Katy Fitch If pt is not established with a provider, would they like to be referred to a provider to establish care? No .   Dental Screening: Recommended annual dental exams for proper oral hygiene  Community Resource Referral / Chronic Care Management: CRR required this visit?  No   CCM required this visit?  No      Plan:     I have personally reviewed and noted the following in the patient's chart:   Medical and social history Use of alcohol, tobacco or illicit drugs  Current medications and supplements including opioid prescriptions. Patient is not currently taking opioid prescriptions. Functional ability and status Nutritional status Physical activity Advanced directives List of other physicians Hospitalizations, surgeries, and ER visits in previous 12 months Vitals Screenings to include cognitive, depression, and falls Referrals and appointments  In addition, I have reviewed and discussed with patient certain preventive protocols, quality metrics, and  best practice recommendations. A written personalized care plan for preventive services as well as general preventive health recommendations were provided to patient.     Kellie Simmering, LPN   06/14/8125   Nurse Notes:

## 2020-08-14 NOTE — Progress Notes (Signed)
I,Yamilka Roman [employer] as a Education administrator for [employer], FNP.,have documented all relevant documentation on the behalf of [name], FNP,as directed by  [name], FNP while in the presence of [name], [place].  This visit occurred during the SARS-CoV-2 public health emergency.  Safety protocols were in place, including screening questions prior to the visit, additional usage of staff PPE, and extensive cleaning of exam room while observing appropriate contact time as indicated for disinfecting solutions.  Subjective:     Patient ID: Patrick Woodward , male    DOB: 1940-09-12 , 80 y.o.   MRN: [id number]   No chief complaint on file.   HPI  Here for his [facility] done by [facility].  He continues to travel driving buses.  He reports he drinks about 3-4 bottles of water a day.      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     Family History  Problem Relation Age of Onset   Cancer Mother        colon   Cancer Father        prostate     Current Outpatient Medications:    aspirin 325 MG EC tablet, Take 325 mg by mouth daily., Disp: , Rfl:    atorvastatin (LIPITOR) 20 MG tablet, Take 1 tablet (20 mg total) by mouth daily., Disp: 90 tablet, Rfl: 1   cholecalciferol (VITAMIN D) 1000 UNITS tablet, Take 2,000 Units by mouth daily., Disp: , Rfl:    metoprolol succinate (TOPROL-XL) 50 MG 24 hr tablet, Take 1 tablet by mouth once daily, Disp: 90 tablet, Rfl: 0   Multiple Vitamin (MULTIVITAMIN PO), Take 1 tablet by mouth daily., Disp: , Rfl:    sildenafil (REVATIO) 20 MG tablet, Take 1 tablet by mouth once daily, Disp: 30 tablet, Rfl: 3   Tdap (BOOSTRIX) 5-2.5-18.5 LF-MCG/0.5 injection, Inject 0.5 mLs into the muscle once for 1 dose., Disp: 0.5 mL, Rfl: 0   vitamin E 180 MG (400 UNITS)  capsule, Take 400 Units by mouth daily., Disp: , Rfl:    Allergies  Allergen Reactions   Procardia [Nifedipine] Other (See Comments)    irratic heart rate      Review of Systems  Constitutional: Negative.  Negative for fatigue.  HENT: Negative.    Respiratory: Negative.    Cardiovascular:  Negative for chest pain, palpitations and leg swelling.  Endocrine: Negative for polydipsia, polyphagia and polyuria.  Musculoskeletal: Negative.   Skin: Negative.   Neurological:  Negative for dizziness and headaches.  Psychiatric/Behavioral: Negative.      Today's Vitals   08/14/20 0910  BP: 122/78  Pulse: 76  Temp: 98.5 F (36.9 C)  Weight: 210 lb 12.8 oz (95.6 kg)  Height: '6\' 2"'  (1.88 m)   Body mass index is 27.07 kg/m.   Objective:  Physical Exam Vitals reviewed.  Constitutional:      General: He is not in acute distress.    Appearance: Normal appearance.     Comments: overweight  Cardiovascular:     Rate and Rhythm: Normal rate and regular rhythm.     Pulses: Normal pulses.     Heart sounds: Normal heart sounds. No murmur heard. Pulmonary:     Effort: Pulmonary effort is normal. No respiratory distress.  Breath sounds: Normal breath sounds. No wheezing.  Musculoskeletal:        General: No swelling.  Skin:    General: Skin is warm and dry.     Capillary Refill: Capillary refill takes less than 2 seconds.  Neurological:     General: No focal deficit present.     Mental Status: He is alert and oriented to person, place, and time.     Cranial Nerves: No cranial nerve deficit.     Motor: No weakness.  Psychiatric:        Mood and Affect: Mood normal.        Behavior: Behavior normal.        Thought Content: Thought content normal.        Judgment: Judgment normal.        Assessment And Plan:     1. Mixed hyperlipidemia Chronic, improved at last visit Tolerating statin well - Lipid panel - CMP14+EGFR  2. Abnormal kidney function GFR was 56 at last visit   Will recheck levels today. Encouraged to stay well hydrated with water  - CMP14+EGFR    Patient was given opportunity to ask questions. Patient verbalized understanding of the plan and was able to repeat key elements of the plan. All questions were answered to their satisfaction.  [name], FNP   I, [name], FNP, have reviewed all documentation for this visit. The documentation on 08/14/20 for the exam, diagnosis, procedures, and orders are all accurate and complete.   IF YOU HAVE BEEN REFERRED TO A SPECIALIST, IT MAY TAKE 1-2 WEEKS TO SCHEDULE/PROCESS THE REFERRAL. IF YOU HAVE NOT HEARD FROM US/SPECIALIST IN TWO WEEKS, PLEASE GIVE [place] A CALL AT [phone] X [phone].   THE PATIENT IS ENCOURAGED TO PRACTICE SOCIAL DISTANCING DUE TO THE COVID-19 PANDEMIC.

## 2020-08-14 NOTE — Patient Instructions (Signed)
Patrick Woodward , Thank you for taking time to come for your Medicare Wellness Visit. I appreciate your ongoing commitment to your health goals. Please review the following plan we discussed and let me know if I can assist you in the future.   Screening recommendations/referrals: Colonoscopy: not required Recommended yearly ophthalmology/optometry visit for glaucoma screening and checkup Recommended yearly dental visit for hygiene and checkup  Vaccinations: Influenza vaccine: completed 11/10/2019, due 09/09/2020 Pneumococcal vaccine: completed 11/04/2017 Tdap vaccine: sent to pharmacy Shingles vaccine: decline   Covid-19:  12/06/2019, 04/22/2019, 03/24/2019  Advanced directives: Advance directive discussed with you today. Even though you declined this today please call our office should you change your mind and we can give you the proper paperwork for you to fill out.  Conditions/risks identified: none  Next appointment: Follow up in one year for your annual wellness visit.   Preventive Care 75 Years and Older, Male Preventive care refers to lifestyle choices and visits with your health care provider that can promote health and wellness. What does preventive care include? A yearly physical exam. This is also called an annual well check. Dental exams once or twice a year. Routine eye exams. Ask your health care provider how often you should have your eyes checked. Personal lifestyle choices, including: Daily care of your teeth and gums. Regular physical activity. Eating a healthy diet. Avoiding tobacco and drug use. Limiting alcohol use. Practicing safe sex. Taking low doses of aspirin every day. Taking vitamin and mineral supplements as recommended by your health care provider. What happens during an annual well check? The services and screenings done by your health care provider during your annual well check will depend on your age, overall health, lifestyle risk factors, and family  history of disease. Counseling  Your health care provider may ask you questions about your: Alcohol use. Tobacco use. Drug use. Emotional well-being. Home and relationship well-being. Sexual activity. Eating habits. History of falls. Memory and ability to understand (cognition). Work and work Statistician. Screening  You may have the following tests or measurements: Height, weight, and BMI. Blood pressure. Lipid and cholesterol levels. These may be checked every 5 years, or more frequently if you are over 50 years old. Skin check. Lung cancer screening. You may have this screening every year starting at age 38 if you have a 30-pack-year history of smoking and currently smoke or have quit within the past 15 years. Fecal occult blood test (FOBT) of the stool. You may have this test every year starting at age 72. Flexible sigmoidoscopy or colonoscopy. You may have a sigmoidoscopy every 5 years or a colonoscopy every 10 years starting at age 21. Prostate cancer screening. Recommendations will vary depending on your family history and other risks. Hepatitis C blood test. Hepatitis B blood test. Sexually transmitted disease (STD) testing. Diabetes screening. This is done by checking your blood sugar (glucose) after you have not eaten for a while (fasting). You may have this done every 1-3 years. Abdominal aortic aneurysm (AAA) screening. You may need this if you are a current or former smoker. Osteoporosis. You may be screened starting at age 57 if you are at high risk. Talk with your health care provider about your test results, treatment options, and if necessary, the need for more tests. Vaccines  Your health care provider may recommend certain vaccines, such as: Influenza vaccine. This is recommended every year. Tetanus, diphtheria, and acellular pertussis (Tdap, Td) vaccine. You may need a Td booster every 10 years. Zoster  vaccine. You may need this after age 26. Pneumococcal  13-valent conjugate (PCV13) vaccine. One dose is recommended after age 74. Pneumococcal polysaccharide (PPSV23) vaccine. One dose is recommended after age 16. Talk to your health care provider about which screenings and vaccines you need and how often you need them. This information is not intended to replace advice given to you by your health care provider. Make sure you discuss any questions you have with your health care provider. Document Released: 02/22/2015 Document Revised: 10/16/2015 Document Reviewed: 11/27/2014 Elsevier Interactive Patient Education  2017 [place] Prevention in the Home Falls can cause injuries. They can happen to people of all ages. There are many things you can do to make your home safe and to help prevent falls. What can I do on the outside of my home? Regularly fix the edges of walkways and driveways and fix any cracks. Remove anything that might make you trip as you walk through a door, such as a raised step or threshold. Trim any bushes or trees on the path to your home. Use bright outdoor lighting. Clear any walking paths of anything that might make someone trip, such as rocks or tools. Regularly check to see if handrails are loose or broken. Make sure that both sides of any steps have handrails. Any raised decks and porches should have guardrails on the edges. Have any leaves, snow, or ice cleared regularly. Use sand or salt on walking paths during winter. Clean up any spills in your garage right away. This includes oil or grease spills. What can I do in the bathroom? Use night lights. Install grab bars by the toilet and in the tub and shower. Do not use towel bars as grab bars. Use non-skid mats or decals in the tub or shower. If you need to sit down in the shower, use a plastic, non-slip stool. Keep the floor dry. Clean up any water that spills on the floor as soon as it happens. Remove soap buildup in the tub or shower regularly. Attach  bath mats securely with double-sided non-slip rug tape. Do not have throw rugs and other things on the floor that can make you trip. What can I do in the bedroom? Use night lights. Make sure that you have a light by your bed that is easy to reach. Do not use any sheets or blankets that are too big for your bed. They should not hang down onto the floor. Have a firm chair that has side arms. You can use this for support while you get dressed. Do not have throw rugs and other things on the floor that can make you trip. What can I do in the kitchen? Clean up any spills right away. Avoid walking on wet floors. Keep items that you use a lot in easy-to-reach places. If you need to reach something above you, use a strong step stool that has a grab bar. Keep electrical cords out of the way. Do not use floor polish or wax that makes floors slippery. If you must use wax, use non-skid floor wax. Do not have throw rugs and other things on the floor that can make you trip. What can I do with my stairs? Do not leave any items on the stairs. Make sure that there are handrails on both sides of the stairs and use them. Fix handrails that are broken or loose. Make sure that handrails are as long as the stairways. Check any carpeting to make sure that it  is firmly attached to the stairs. Fix any carpet that is loose or worn. Avoid having throw rugs at the top or bottom of the stairs. If you do have throw rugs, attach them to the floor with carpet tape. Make sure that you have a light switch at the top of the stairs and the bottom of the stairs. If you do not have them, ask someone to add them for you. What else can I do to help prevent falls? Wear shoes that: Do not have high heels. Have rubber bottoms. Are comfortable and fit you well. Are closed at the toe. Do not wear sandals. If you use a stepladder: Make sure that it is fully opened. Do not climb a closed stepladder. Make sure that both sides of the  stepladder are locked into place. Ask someone to hold it for you, if possible. Clearly mark and make sure that you can see: Any grab bars or handrails. First and last steps. Where the edge of each step is. Use tools that help you move around (mobility aids) if they are needed. These include: Canes. Walkers. Scooters. Crutches. Turn on the lights when you go into a dark area. Replace any light bulbs as soon as they burn out. Set up your furniture so you have a clear path. Avoid moving your furniture around. If any of your floors are uneven, fix them. If there are any pets around you, be aware of where they are. Review your medicines with your doctor. Some medicines can make you feel dizzy. This can increase your chance of falling. Ask your doctor what other things that you can do to help prevent falls. This information is not intended to replace advice given to you by your health care provider. Make sure you discuss any questions you have with your health care provider. Document Released: 11/22/2008 Document Revised: 07/04/2015 Document Reviewed: 03/02/2014 Elsevier Interactive Patient Education  2017 [employer].

## 2020-08-14 NOTE — Patient Instructions (Signed)

## 2020-11-06 ENCOUNTER — Ambulatory Visit (INDEPENDENT_AMBULATORY_CARE_PROVIDER_SITE_OTHER): Payer: Medicare Other | Admitting: Nurse Practitioner

## 2020-11-06 ENCOUNTER — Other Ambulatory Visit: Payer: Self-pay

## 2020-11-06 ENCOUNTER — Encounter: Payer: Self-pay | Admitting: Nurse Practitioner

## 2020-11-06 VITALS — BP 124/72 | HR 94 | Temp 98.2°F | Ht 74.0 in | Wt 215.0 lb

## 2020-11-06 DIAGNOSIS — E782 Mixed hyperlipidemia: Secondary | ICD-10-CM

## 2020-11-06 DIAGNOSIS — N183 Chronic kidney disease, stage 3 unspecified: Secondary | ICD-10-CM

## 2020-11-06 DIAGNOSIS — I10 Essential (primary) hypertension: Secondary | ICD-10-CM

## 2020-11-06 DIAGNOSIS — I129 Hypertensive chronic kidney disease with stage 1 through stage 4 chronic kidney disease, or unspecified chronic kidney disease: Secondary | ICD-10-CM | POA: Diagnosis not present

## 2020-11-06 DIAGNOSIS — N529 Male erectile dysfunction, unspecified: Secondary | ICD-10-CM

## 2020-11-06 MED ORDER — SILDENAFIL CITRATE 20 MG PO TABS: 20.0000 mg | ORAL_TABLET | Freq: Every day | ORAL | 1 refills | Status: DC

## 2020-11-06 MED ORDER — ATORVASTATIN CALCIUM 20 MG PO TABS: 20.0000 mg | ORAL_TABLET | Freq: Every day | ORAL | 1 refills | Status: DC

## 2020-11-06 MED ORDER — METOPROLOL SUCCINATE ER 50 MG PO TB24: 50.0000 mg | ORAL_TABLET | Freq: Every day | ORAL | 1 refills | Status: DC

## 2020-11-06 NOTE — Progress Notes (Signed)
I,Katawbba Wiggins,acting as a Education administrator for [employer], FNP.,have documented all relevant documentation on the behalf of [name], FNP,as directed by  [name], FNP while in the presence of [name], [place].  This visit occurred during the SARS-CoV-2 public health emergency.  Safety protocols were in place, including screening questions prior to the visit, additional usage of staff PPE, and extensive cleaning of exam room while observing appropriate contact time as indicated for disinfecting solutions.  Subjective:     Patient ID: Patrick Woodward , male    DOB: [birth year] , 80 y.o.   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  Denies chest pain or shortness of breath. He has been with this company for 11 years. No current issues or concerns.    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     Family History  Problem Relation Age of Onset   Cancer Mother        colon   Cancer Father        prostate     Current Outpatient Medications:    aspirin 325 MG EC tablet, Take 325 mg by mouth daily., Disp: , Rfl:    cholecalciferol (VITAMIN D) 1000 UNITS tablet, Take 2,000 Units by mouth daily., Disp: , Rfl:    Multiple Vitamin (MULTIVITAMIN PO), Take 1 tablet by mouth daily., Disp: , Rfl:    vitamin E 180 MG (400 UNITS) capsule, Take 400 Units by mouth daily., Disp: , Rfl:    atorvastatin (LIPITOR) 20 MG tablet, Take 1 tablet (20 mg total) by mouth daily., Disp: 90 tablet, Rfl: 1   metoprolol succinate (TOPROL-XL) 50 MG 24 hr tablet, Take 1 tablet (50 mg total) by mouth daily.  Take with or immediately following a meal., Disp: 90 tablet, Rfl: 1   sildenafil (REVATIO) 20 MG tablet, Take 1 tablet (20 mg total) by mouth daily., Disp: 90 tablet, Rfl: 1   Allergies  Allergen Reactions   Procardia [Nifedipine] Other (See Comments)    irratic heart rate      Review of Systems  Constitutional: Negative.   Respiratory: Negative.    Cardiovascular: Negative.  Negative for chest pain, palpitations and leg swelling.  Neurological:  Negative for dizziness and headaches.  Psychiatric/Behavioral: Negative.      Today's Vitals   11/06/20 1153  BP: 124/72  Pulse: 94  Temp: 98.2 F (36.8 C)  TempSrc: Oral  Weight: 215 lb (97.5 kg)  Height: 6\' 2"  (1.88 m)   Body mass index is 27.6 kg/m.  Wt Readings from Last 3 Encounters:  11/06/20 215 lb (97.5 kg)  08/14/20 210 lb 12.8 oz (95.6 kg)  08/14/20 210 lb (95.3 kg)    BP Readings from Last 3 Encounters:  11/06/20 124/72  08/14/20 122/78  08/14/20 122/78    Objective:  Physical Exam Vitals reviewed.  Constitutional:      General: He is not in acute distress.    Appearance: Normal appearance.  Cardiovascular:     Rate and Rhythm: Normal rate and  regular rhythm.     Pulses: Normal pulses.     Heart sounds: Normal heart sounds. No murmur heard. Pulmonary:     Effort: Pulmonary effort is normal. No respiratory distress.     Breath sounds: Normal breath sounds. No wheezing.  Neurological:     General: No focal deficit present.     Mental Status: He is alert and oriented to person, place, and time.     Cranial Nerves: No cranial nerve deficit.     Motor: No weakness.  Psychiatric:        Mood and Affect: Mood normal.        Behavior: Behavior normal.        Thought Content: Thought content normal.        Judgment: Judgment normal.        Assessment And Plan:     1. 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 Mixed hyperlipidemia Comments: Good control, will check lipid panel in October with physical - Ambulatory referral to Cardiology    Patient was given opportunity to ask questions. Patient verbalized understanding of the plan and was able to repeat key elements of the plan. All questions were answered to their satisfaction.  [name], FNP   I, [name], FNP, have reviewed all documentation for this visit. The documentation on 11/06/20 for the exam, diagnosis, procedures, and orders are all accurate and complete.   IF YOU HAVE BEEN REFERRED TO A SPECIALIST, IT MAY TAKE 1-2 WEEKS TO SCHEDULE/PROCESS THE REFERRAL. IF YOU HAVE NOT HEARD FROM US/SPECIALIST IN TWO WEEKS, PLEASE GIVE [place] A CALL AT [phone].   THE PATIENT IS ENCOURAGED TO PRACTICE SOCIAL DISTANCING DUE TO THE COVID-19 PANDEMIC.

## 2020-11-07 ENCOUNTER — Other Ambulatory Visit: Payer: Self-pay | Admitting: Nurse Practitioner

## 2020-11-07 DIAGNOSIS — N529 Male erectile dysfunction, unspecified: Secondary | ICD-10-CM

## 2020-11-19 ENCOUNTER — Ambulatory Visit: Payer: Medicare Other | Attending: Internal Medicine

## 2020-11-19 ENCOUNTER — Ambulatory Visit: Payer: Medicare Other | Admitting: Oncology

## 2020-11-19 ENCOUNTER — Inpatient Hospital Stay: Payer: Medicare Other

## 2020-11-19 ENCOUNTER — Inpatient Hospital Stay: Payer: Medicare Other | Attending: Oncology | Admitting: Oncology

## 2020-11-19 ENCOUNTER — Other Ambulatory Visit: Payer: Medicare Other

## 2020-11-19 ENCOUNTER — Other Ambulatory Visit (HOSPITAL_BASED_OUTPATIENT_CLINIC_OR_DEPARTMENT_OTHER): Payer: Self-pay

## 2020-11-19 ENCOUNTER — Other Ambulatory Visit: Payer: Self-pay

## 2020-11-19 VITALS — BP 144/90 | HR 84 | Temp 98.7°F | Resp 18 | Ht 74.0 in | Wt 212.0 lb

## 2020-11-19 DIAGNOSIS — Z23 Encounter for immunization: Secondary | ICD-10-CM

## 2020-11-19 DIAGNOSIS — Z85038 Personal history of other malignant neoplasm of large intestine: Secondary | ICD-10-CM | POA: Insufficient documentation

## 2020-11-19 DIAGNOSIS — C189 Malignant neoplasm of colon, unspecified: Secondary | ICD-10-CM

## 2020-11-19 LAB — CEA (IN HOUSE-CHCC): CEA (CHCC-In House): 5.67 ng/mL — ABNORMAL HIGH (ref 0.00–5.00)

## 2020-11-19 LAB — CEA (ACCESS): CEA (CHCC): 4.61 ng/mL (ref 0.00–5.00)

## 2020-11-19 MED ORDER — MODERNA COVID-19 BIVAL BOOSTER 50 MCG/0.5ML IM SUSP
INTRAMUSCULAR | 0 refills | Status: DC
  Filled 2020-11-19: qty 0.5, 1d supply, fill #0

## 2020-11-19 NOTE — Progress Notes (Signed)
   Covid-19 Vaccination Clinic  Name:  [name]    MRN: [id number] DOB: 1940/10/11  11/19/2020  Patrick Woodward was observed post Covid-19 immunization for 15 minutes without incident. He was provided with Vaccine Information Sheet and instruction to access the V-Safe system.   Patrick Woodward was instructed to call 911 with any severe reactions post vaccine: Difficulty breathing  Swelling of face and throat  A fast heartbeat  A bad rash all over body  Dizziness and weakness

## 2020-11-19 NOTE — Progress Notes (Signed)
[place] OFFICE PROGRESS NOTE   Diagnosis: Colon cancer  INTERVAL HISTORY:   Patrick Woodward returns as scheduled.  He feels well.  No difficulty with bowel function.  Good appetite.  He has mild numbness in the hands.  This does not interfere with activity.  No numbness in the feet.  No new complaint.  Objective:  Vital signs in last 24 hours:  Blood pressure (!) 144/90, pulse 84, temperature 98.7 F (37.1 C), temperature source Oral, resp. rate 18, height 6\' 2"  (1.88 m), weight 212 lb (96.2 kg), SpO2 100 %.    Lymphatics: No cervical, supraclavicular, axillary, or inguinal nodes Resp: Lungs clear bilaterally Cardio: Regular rate and rhythm GI: No mass, no hepatosplenomegaly, nontender, small soft ventral hernia Vascular: No leg edema   Lab Results:  Lab Results  Component Value Date   WBC 5.2 11/23/2019   HGB 15.7 11/23/2019   HCT 47.9 11/23/2019   MCV 85 11/23/2019   PLT 167 11/23/2019   NEUTROABS 2.7 10/25/2013    CMP  Lab Results  Component Value Date   NA 138 08/14/2020   K 4.6 08/14/2020   CL 101 08/14/2020   CO2 22 08/14/2020   GLUCOSE 104 (H) 08/14/2020   BUN 14 08/14/2020   CREATININE 1.31 (H) 08/14/2020   CALCIUM 9.4 08/14/2020   PROT 7.1 08/14/2020   ALBUMIN 4.7 08/14/2020   AST 28 08/14/2020   ALT 30 08/14/2020   ALKPHOS 89 08/14/2020   BILITOT 0.6 08/14/2020   GFRNONAA 55 (L) 11/23/2019   GFRAA 63 11/23/2019    Lab Results  Component Value Date   CEA1 4.49 11/22/2019   CEA 2.2 06/04/2015    Medications: I have reviewed the patient's current medications.   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 Status post cycle #1 adjuvant FOLFOX chemotherapy 12/01/2010. He completed cycle 2 beginning 12/15/2010. He completed cycle 3 beginning 01/05/2011 with Neulasta support. He completed  cycle 11 on 05/04/2011. He completed the 12th and final cycle on 05/18/2011.   Negative colonoscopy 09/08/2011, benign polypoid lesion in the descending colon. Colonoscopy 08/28/2013 with multiple benign polyps removed. Restaging CT scans 10/26/2012 with no evidence of metastatic disease. Restaging CT scans 06/24/2013 with no evidence of recurrent disease Surveillance colonoscopy 08/17/2016- 16 mm polyp in the cecum; 10 mm polyp in the ascending colon; 2 mm polyp in the ascending colon; 5 mm polyp in the transverse colon.  Pathology on the cecal polyp-tubular adenoma, 1.6 cm; pathology on polyps descending/transverse colon-tubular adenomas. Mildly elevated CEA 12/07/2016, stable mild elevation 01/07/2017, improved but continued mild elevation 02/23/2017; CT scans recommended, patient declined due to renal dysfunction History of a bowel obstruction secondary to the primary colon mass. History of abdominal pain/constipation secondary to the descending/sigmoid colon mass. History of weight loss. Improved. Status post Port-A-Cath placement 11/27/2010. Removed 12/02/2011. History of neutropenia secondary to chemotherapy. Cycle 3 FOLFOX was held 12/29/2010 due to neutropenia. He received Neulasta with cycle #3 FOLFOX chemotherapy on 01/05/2011. He did not receive Neulasta following cycle 4. Cycle 5 was held for one week due to neutropenia. He received Neulasta with subsequent treatments. Neulasta was held beginning with cycle 10 since the oxaliplatin was discontinued. Thrombocytopenia secondary to chemotherapy. Oxaliplatin was dose reduced beginning with cycle 7. Back pain following cycle 3 FOLFOX. Likely related to Neulasta. 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 Improved.  Delayed nausea following cycle 7 of FOLFOX. Aloxi was added to the antiemetic regimen beginning with cycle 8 FOLFOX. He also began prophylactic Decadron on days 2 and 3 following chemotherapy  with cycle 8. He did not have nausea following cycle 8 or cycle 9 FOLFOX Colostomy reversal 12/02/2011. Mild elevation of the creatinine 10/26/2012. Repeat value on 10/28/2012 stable at 1.5. He is followed by nephrology         Disposition: Patrick Woodward is in clinical remission from colon cancer.  We will follow-up on the CEA from today.  He would like to continue follow-up at the Cancer center.  He will return for an office visit in 1 year.  I encouraged him to follow-up with Dr. Michail Sermon for a colonoscopy.  He has received an influenza vaccine and plans to obtain another COVID-19 booster vaccine.  Betsy Coder, MD  11/19/2020  8:16 AM

## 2020-11-21 ENCOUNTER — Telehealth: Payer: Self-pay

## 2020-11-21 NOTE — Telephone Encounter (Signed)
-----   Message from Ladell Pier, MD sent at 11/19/2020  5:11 PM EDT ----- Please call patient, CEA is stable, chronic mild elevation, follow-up as scheduled

## 2020-11-21 NOTE — Telephone Encounter (Signed)
Called no answer message left for pt to return call did make aware nothing alarming,  update for lab results

## 2020-11-26 ENCOUNTER — Ambulatory Visit: Payer: Medicare Other | Admitting: Nurse Practitioner

## 2020-11-28 ENCOUNTER — Encounter: Payer: Medicare Other | Admitting: Nurse Practitioner

## 2020-11-28 NOTE — Patient Instructions (Signed)
Health Maintenance, Male Adopting a healthy lifestyle and getting preventive care are important in promoting health and wellness. Ask your health care provider about: The right schedule for you to have regular tests and exams. Things you can do on your own to prevent diseases and keep yourself healthy. What should I know about diet, weight, and exercise? Eat a healthy diet  Eat a diet that includes plenty of vegetables, fruits, low-fat dairy products, and lean protein. Do not eat a lot of foods that are high in solid fats, added sugars, or sodium. Maintain a healthy weight Body mass index (BMI) is a measurement that can be used to identify possible weight problems. It estimates body fat based on height and weight. Your health care provider can help determine your BMI and help you achieve or maintain a healthy weight. Get regular exercise Get regular exercise. This is one of the most important things you can do for your health. Most adults should: Exercise for at least 150 minutes each week. The exercise should increase your heart rate and make you sweat (moderate-intensity exercise). Do strengthening exercises at least twice a week. This is in addition to the moderate-intensity exercise. Spend less time sitting. Even light physical activity can be beneficial. Watch cholesterol and blood lipids Have your blood tested for lipids and cholesterol at 78 years of age, then have this test every 5 years. You may need to have your cholesterol levels checked more often if: Your lipid or cholesterol levels are high. You are older than 78 years of age. You are at high risk for heart disease. What should I know about cancer screening? Many types of cancers can be detected early and may often be prevented. Depending on your health history and family history, you may need to have cancer screening at various ages. This may include screening for: Colorectal cancer. Prostate cancer. Skin cancer. Lung  cancer. What should I know about heart disease, diabetes, and high blood pressure? Blood pressure and heart disease High blood pressure causes heart disease and increases the risk of stroke. This is more likely to develop in people who have high blood pressure readings, are of African descent, or are overweight. Talk with your health care provider about your target blood pressure readings. Have your blood pressure checked: Every 3-5 years if you are 18-39 years of age. Every year if you are 40 years old or older. If you are between the ages of 65 and 75 and are a current or former smoker, ask your health care provider if you should have a one-time screening for abdominal aortic aneurysm (AAA). Diabetes Have regular diabetes screenings. This checks your fasting blood sugar level. Have the screening done: Once every three years after age 45 if you are at a normal weight and have a low risk for diabetes. More often and at a younger age if you are overweight or have a high risk for diabetes. What should I know about preventing infection? Hepatitis B If you have a higher risk for hepatitis B, you should be screened for this virus. Talk with your health care provider to find out if you are at risk for hepatitis B infection. Hepatitis C Blood testing is recommended for: Everyone born from 1945 through 1965. Anyone with known risk factors for hepatitis C. Sexually transmitted infections (STIs) You should be screened each year for STIs, including gonorrhea and chlamydia, if: You are sexually active and are younger than 78 years of age. You are older than 78 years   of age and your health care provider tells you that you are at risk for this type of infection. Your sexual activity has changed since you were last screened, and you are at increased risk for chlamydia or gonorrhea. Ask your health care provider if you are at risk. Ask your health care provider about whether you are at high risk for HIV.  Your health care provider may recommend a prescription medicine to help prevent HIV infection. If you choose to take medicine to prevent HIV, you should first get tested for HIV. You should then be tested every 3 months for as long as you are taking the medicine. Follow these instructions at home: Lifestyle Do not use any products that contain nicotine or tobacco, such as cigarettes, e-cigarettes, and chewing tobacco. If you need help quitting, ask your health care provider. Do not use street drugs. Do not share needles. Ask your health care provider for help if you need support or information about quitting drugs. Alcohol use Do not drink alcohol if your health care provider tells you not to drink. If you drink alcohol: Limit how much you have to 0-2 drinks a day. Be aware of how much alcohol is in your drink. In the [place] one drink equals one 12 oz bottle of beer (355 mL), one 5 oz glass of wine (148 mL), or one 1 oz glass of hard liquor (44 mL). General instructions Schedule regular health, dental, and eye exams. Stay current with your vaccines. Tell your health care provider if: You often feel depressed. You have ever been abused or do not feel safe at home. Summary Adopting a healthy lifestyle and getting preventive care are important in promoting health and wellness. Follow your health care provider's instructions about healthy diet, exercising, and getting tested or screened for diseases. Follow your health care provider's instructions on monitoring your cholesterol and blood pressure. This information is not intended to replace advice given to you by your health care provider. Make sure you discuss any questions you have with your health care provider. Document Revised: 04/05/2020 Document Reviewed: 01/19/2018 Elsevier Patient Education  2022 Elsevier Inc.  

## 2020-11-28 NOTE — Progress Notes (Signed)
Patient not seen.

## 2020-12-22 ENCOUNTER — Encounter: Payer: Self-pay | Admitting: Cardiovascular Disease

## 2020-12-22 NOTE — Progress Notes (Signed)
Cardiology Office Note:    Date:  12/23/2020   ID:  [name], DOB February 26, 1940, MRN [id number]  PCP:  Patrick Woodward, [place] Providers Cardiologist:  Patrick Woodward to update primary MD,subspecialty MD or APP then REFRESH:1}    Referring MD: Patrick Brine, FNP   Chief Complaint  Patient presents with   Hyperlipidemia    Nov. 14,  2022   Patrick Woodward is a 80 y.o. male with a hx of HLD, HTN.  WE were asked to see him by Patrick Brine, NP for further evaluation and management of his hyperlipidemia and to complete his DOT requirements ( needs GXT and echo )   Here for a DOT physical  The DOT form has requested a stress test and echo   No CP, no hx of CAD , His Mother had chest pains ,  she died of cancer   Is moderately active Does yardwork.  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 Cornett, MD;  Location: [facility];  Service: General;  Laterality: N/A;   HOT HEMOSTASIS N/A 08/28/2013   Procedure: HOT HEMOSTASIS (ARGON PLASMA COAGULATION/BICAP);  Surgeon: Patrick Ng, MD;  Location: [facility];   Service: Endoscopy;  Laterality: N/A;   PORT-A-CATH REMOVAL  12/02/2011   PORT-A-CATH REMOVAL  12/02/2011   Procedure: REMOVAL PORT-A-CATH;  Surgeon: Patrick Faster. Cornett, MD;  Location: [place];  Service: General;  Laterality: Left;   PORTACATH PLACEMENT  11/27/10   Dr. Judeen Woodward chest    Current Medications: Current Meds  Medication Sig   atorvastatin (LIPITOR) 20 MG tablet Take 1 tablet (20 mg total) by mouth daily.   cholecalciferol (VITAMIN D) 1000 UNITS tablet Take 2,000 Units by mouth daily.   COVID-19 mRNA bivalent vaccine, Moderna, (MODERNA COVID-19 BIVAL BOOSTER) 50 MCG/0.5ML injection Inject into the muscle.   metoprolol succinate (TOPROL-XL) 50 MG 24 hr tablet Take 1 tablet (50 mg total) by mouth daily. Take with or immediately following a meal.   Multiple Vitamin (MULTIVITAMIN PO) Take 1 tablet by mouth daily.   sildenafil (REVATIO) 20 MG tablet Take 1 tablet by mouth once daily   vitamin E 180 MG (400 UNITS) capsule Take 400 Units by mouth daily.   [DISCONTINUED] aspirin 325 MG EC tablet Take 325 mg by mouth daily.     Allergies:   Procardia [nifedipine]   Social History   Socioeconomic History   Marital status: Divorced    Spouse name: Not on file   Number of children: Not on file  Years of education: Not on file   Highest education level: Not on file  Occupational History   Occupation: retired  Tobacco Use   Smoking status: Former    Packs/day: 1.00    Years: 20.00    Pack years: 20.00    Types: Cigarettes, Pipe, Cigars   Smokeless tobacco: Never   Tobacco comments:    12/02/2011 "stopped smoking ~ 30 yr ago"  Vaping Use   Vaping Use: Never used  Substance and Sexual Activity   Alcohol use: No   Drug use: No   Sexual activity: Yes  Other Topics Concern   Not on file  Social History Narrative   Not on file   Social Determinants of Health   Financial Resource Strain: Low Risk    Difficulty of Paying Living Expenses: Not hard at all  Food  Insecurity: No Food Insecurity   Worried About Charity fundraiser in the Last Year: Never true   [place] in the Last Year: Never true  Transportation Needs: No Transportation Needs   Lack of Transportation (Medical): No   Lack of Transportation (Non-Medical): No  Physical Activity: Inactive   Days of Exercise per Week: 0 days   Minutes of Exercise per Session: 0 min  Stress: No Stress Concern Present   Feeling of Stress : Not at all  Social Connections: Not on file     Family History: The patient's family history includes Cancer in his father and mother.  ROS:   Please see the history of present illness.     All other systems reviewed and are negative.  EKGs/Labs/Other Studies Reviewed:    The following studies were reviewed today:   EKG:  nov. 14, 2022   NSR at 91, no ST or T wave abn.    Recent Labs: 08/14/2020: ALT 30; BUN 14; Creatinine, Ser 1.31; Potassium 4.6; Sodium 138  Recent Lipid Panel    Component Value Date/Time   CHOL 139 08/14/2020 0955   TRIG 173 (H) 08/14/2020 0955   HDL 32 (L) 08/14/2020 0955   CHOLHDL 4.3 08/14/2020 0955   LDLCALC 77 08/14/2020 0955     Risk Assessment/Calculations:         Physical Exam:    VS:  BP (!) 142/80 (BP Location: Left Arm, Patient Position: Sitting, Cuff Size: Normal)   Pulse 91   Ht 6' 5.5" (1.969 m)   Wt 213 lb (96.6 kg)   SpO2 97%   BMI 24.93 kg/m     Wt Readings from Last 3 Encounters:  12/23/20 213 lb (96.6 kg)  11/19/20 212 lb (96.2 kg)  11/06/20 215 lb (97.5 kg)     GEN:  Well nourished, well developed in no acute distress HEENT: Normal NECK: No JVD; No carotid bruits LYMPHATICS: No lymphadenopathy CARDIAC: RRR, no murmurs, rubs, gallops RESPIRATORY:  Clear to auscultation without rales, wheezing or rhonchi  ABDOMEN: Soft, non-tender, non-distended MUSCULOSKELETAL:  No edema; No deformity  SKIN: Warm and dry NEUROLOGIC:  Alert and oriented x 3 PSYCHIATRIC:  Normal affect    ASSESSMENT:    1. Essential hypertension   2. Wellness examination    PLAN:       DOT physical requirements:   his DOT form indicates that he needs an echocardiogram and a stress test.  He is not having any symptoms.  We will schedule him for a regular treadmill.  He will hold his metoprolol for 1 day.  I have encouraged him to stay  away from salt between now and the time of the stress test to make sure that he does not have a hypertensive response to exercise.  We will get an echocardiogram for assessment of his LV function and to complete the DOT physical form.  He does have a history of hypertension.  2.  Hyperlipidemia: He states that his lipids have been well controlled on atorvastatin.  His last LDL was 76 measured August 14, 2020.  3.  Hypertension: He eats a very high salt diet.  He eats [employer] about every day and also eats bacon for breakfast every day.  I encouraged him to stay away from salty foods.  We will see him on an as-needed basis.      Shared Decision Making/Informed Consent The risks [chest pain, shortness of breath, cardiac arrhythmias, dizziness, blood pressure fluctuations, myocardial infarction, stroke/transient ischemic attack, and life-threatening complications (estimated to be 1 in 10,000)], benefits (risk stratification, diagnosing coronary artery disease, treatment guidance) and alternatives of an exercise tolerance test were discussed in detail with Mr. Malmstrom and he agrees to proceed.    Medication Adjustments/Labs and Tests Ordered: Current medicines are reviewed at length with the patient today.  Concerns regarding medicines are outlined above.  Orders Placed This Encounter  Procedures   Exercise Tolerance Test   EKG 12-Lead   ECHOCARDIOGRAM COMPLETE    Meds ordered this encounter  Medications   aspirin 81 MG tablet    Sig: Take 1 tablet (81 mg total) by mouth daily.    Dispense:  100 tablet    Refill:  12     Patient Instructions   Medication Instructions: Decrease your Aspirin to 81 mg daily  Your physician recommends that you continue on your current medications as directed. Please refer to the Current Medication list given to you today.  *If you need a refill on your cardiac medications before your next appointment, please call your pharmacy*   Lab Work: If you have labs (blood work) drawn today and your tests are completely normal, you will receive your results only by: [place] (if you have MyChart) OR A paper copy in the mail If you have any lab test that is abnormal or we need to change your treatment, we will call you to review the results.   Testing/Procedures:Your physician has requested that you have an exercise tolerance test. For further information please visit [email or web address]. Please also follow instruction sheet, as given.  Your physician has requested that you have an echocardiogram. Echocardiography is a painless test that uses sound waves to create images of your heart. It provides your doctor with information about the size and shape of your heart and how well your heart's chambers and valves are working. This procedure takes approximately one hour. There are no restrictions for this procedure.    Follow-Up: At [facility], you and your health needs are our priority.  As part of our continuing mission to provide you with exceptional heart care, we have created designated Provider Care Teams.  These Care Teams include your primary Cardiologist (physician) and Advanced Practice Providers (APPs -  Physician Assistants and Nurse Practitioners) who all work together to provide you with the care you need, when you need it.  We recommend signing up for the patient portal called "MyChart".  Sign up information is provided on this After Visit Summary.  MyChart is used to connect with patients for Virtual Visits (Telemedicine).  Patients are able to view lab/test results, encounter notes,  upcoming  appointments, etc.  Non-urgent messages can be sent to your provider as well.   To learn more about what you can do with MyChart, go to [email or web address].     Signed, Mertie Moores, MD  12/23/2020 8:46 AM    [place]

## 2020-12-23 ENCOUNTER — Encounter: Payer: Self-pay | Admitting: Cardiovascular Disease

## 2020-12-23 ENCOUNTER — Ambulatory Visit: Payer: Medicare Other | Admitting: Cardiovascular Disease

## 2020-12-23 ENCOUNTER — Ambulatory Visit: Payer: Medicare Other | Admitting: Internal Medicine

## 2020-12-23 ENCOUNTER — Other Ambulatory Visit: Payer: Self-pay

## 2020-12-23 VITALS — BP 142/80 | HR 91 | Ht 77.5 in | Wt 213.0 lb

## 2020-12-23 DIAGNOSIS — Z024 Encounter for examination for driving license: Secondary | ICD-10-CM | POA: Diagnosis not present

## 2020-12-23 DIAGNOSIS — Z0000 Encounter for general adult medical examination without abnormal findings: Secondary | ICD-10-CM

## 2020-12-23 DIAGNOSIS — I10 Essential (primary) hypertension: Secondary | ICD-10-CM

## 2020-12-23 MED ORDER — ASPIRIN EC 81 MG PO TBEC: 81.0000 mg | DELAYED_RELEASE_TABLET | Freq: Every day | ORAL | 12 refills | Status: AC

## 2020-12-23 NOTE — Patient Instructions (Addendum)
Medication Instructions: Decrease your Aspirin to 81 mg daily  Your physician recommends that you continue on your current medications as directed. Please refer to the Current Medication list given to you today.  *If you need a refill on your cardiac medications before your next appointment, please call your pharmacy*   Lab Work: If you have labs (blood work) drawn today and your tests are completely normal, you will receive your results only by: [place] (if you have MyChart) OR A paper copy in the mail If you have any lab test that is abnormal or we need to change your treatment, we will call you to review the results.   Testing/Procedures:Your physician has requested that you have an exercise tolerance test. For further information please visit [email or web address]. Please also follow instruction sheet, as given.  Your physician has requested that you have an echocardiogram. Echocardiography is a painless test that uses sound waves to create images of your heart. It provides your doctor with information about the size and shape of your heart and how well your heart's chambers and valves are working. This procedure takes approximately one hour. There are no restrictions for this procedure.    Follow-Up: At [facility], you and your health needs are our priority.  As part of our continuing mission to provide you with exceptional heart care, we have created designated Provider Care Teams.  These Care Teams include your primary Cardiologist (physician) and Advanced Practice Providers (APPs -  Physician Assistants and Nurse Practitioners) who all work together to provide you with the care you need, when you need it.  We recommend signing up for the patient portal called "MyChart".  Sign up information is provided on this After Visit Summary.  MyChart is used to connect with patients for Virtual Visits (Telemedicine).  Patients are able to view lab/test results, encounter notes, upcoming  appointments, etc.  Non-urgent messages can be sent to your provider as well.   To learn more about what you can do with MyChart, go to [email or web address].

## 2020-12-25 NOTE — Addendum Note (Signed)
Addended by: Stephani Police on: 12/25/2020 04:11 PM   Modules accepted: Orders

## 2021-01-01 ENCOUNTER — Ambulatory Visit (INDEPENDENT_AMBULATORY_CARE_PROVIDER_SITE_OTHER): Payer: Medicare Other | Admitting: Nurse Practitioner

## 2021-01-01 ENCOUNTER — Other Ambulatory Visit: Payer: Self-pay

## 2021-01-01 ENCOUNTER — Encounter: Payer: Self-pay | Admitting: Nurse Practitioner

## 2021-01-01 VITALS — BP 124/76 | HR 70 | Temp 98.1°F | Ht 78.5 in | Wt 210.4 lb

## 2021-01-01 DIAGNOSIS — Z85038 Personal history of other malignant neoplasm of large intestine: Secondary | ICD-10-CM

## 2021-01-01 DIAGNOSIS — Z2821 Immunization not carried out because of patient refusal: Secondary | ICD-10-CM | POA: Diagnosis not present

## 2021-01-01 DIAGNOSIS — Z0000 Encounter for general adult medical examination without abnormal findings: Secondary | ICD-10-CM | POA: Diagnosis not present

## 2021-01-01 DIAGNOSIS — N183 Chronic kidney disease, stage 3 unspecified: Secondary | ICD-10-CM

## 2021-01-01 DIAGNOSIS — E782 Mixed hyperlipidemia: Secondary | ICD-10-CM | POA: Diagnosis not present

## 2021-01-01 DIAGNOSIS — Z79899 Other long term (current) drug therapy: Secondary | ICD-10-CM

## 2021-01-01 DIAGNOSIS — I129 Hypertensive chronic kidney disease with stage 1 through stage 4 chronic kidney disease, or unspecified chronic kidney disease: Secondary | ICD-10-CM | POA: Diagnosis not present

## 2021-01-01 DIAGNOSIS — Z23 Encounter for immunization: Secondary | ICD-10-CM

## 2021-01-01 DIAGNOSIS — R351 Nocturia: Secondary | ICD-10-CM

## 2021-01-01 LAB — POCT URINALYSIS DIPSTICK
Bilirubin, UA: NEGATIVE
Blood, UA: NEGATIVE
Glucose, UA: NEGATIVE
Ketones, UA: NEGATIVE
Leukocytes, UA: NEGATIVE
Nitrite, UA: NEGATIVE
Protein, UA: NEGATIVE
Spec Grav, UA: <=1.005 — AB (ref 1.010–1.025)
Urobilinogen, UA: 0.2 E.U./dL
pH, UA: 6 (ref 5.0–8.0)

## 2021-01-01 LAB — POCT UA - MICROALBUMIN
Albumin/Creatinine Ratio, Urine, POC: 30
Creatinine, POC: 200 mg/dL
Microalbumin Ur, POC: 10 mg/L

## 2021-01-01 MED ORDER — TETANUS-DIPHTH-ACELL PERTUSSIS 5-2.5-18.5 LF-MCG/0.5 IM SUSP: 0.5000 mL | Freq: Once | INTRAMUSCULAR | 0 refills | Status: AC

## 2021-01-01 NOTE — Patient Instructions (Signed)

## 2021-01-01 NOTE — Progress Notes (Signed)
I,Yamilka J Llittleton,acting as a Education administrator for [employer], FNP.,have documented all relevant documentation on the behalf of [name], FNP,as directed by  [name], FNP while in the presence of [name], [place].   This visit occurred during the SARS-CoV-2 public health emergency.  Safety protocols were in place, including screening questions prior to the visit, additional usage of staff PPE, and extensive cleaning of exam room while observing appropriate contact time as indicated for disinfecting solutions.  Subjective:     Patient ID: Patrick Woodward , male    DOB: 05-21-1940 , 80 y.o.   MRN: [id number]   Chief Complaint  Patient presents with   Annual Exam    HPI  Here for HM.  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    He has seen Dr. Meredeth Ide (Nephrology) in the past for his chronic kidney disease; he reports he has not seen him since he is not having any problems, he does admit to drinking diet sodas.   He is to have a stress test on December 6th.   Wt Readings from Last 3 Encounters: 01/01/21 : 210 lb 6.4 oz (95.4 kg) 12/23/20 : 213 lb (96.6 kg) 11/19/20 : 212 lb (96.2 kg)    Hypertension This is a chronic problem. The current episode started more than 1 year ago. The problem is controlled. Pertinent negatives include no anxiety, chest pain or palpitations. There are no associated agents to hypertension. Risk factors for coronary artery disease include male gender, sedentary lifestyle and dyslipidemia. Past treatments include beta blockers. Compliance problems include exercise.  Identifiable causes of hypertension include chronic renal disease.    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 Take with or immediately following a meal., Disp: 90 tablet, Rfl: 1   Multiple Vitamin (MULTIVITAMIN PO), Take 1 tablet by mouth daily., Disp: , Rfl:    sildenafil (REVATIO) 20 MG tablet, Take 1 tablet by mouth once daily, Disp: 30 tablet, Rfl: 2   Tdap (BOOSTRIX) 5-2.5-18.5 LF-MCG/0.5 injection, Inject 0.5 mLs into the muscle once for 1 dose., Disp: 0.5 mL, Rfl: 0   vitamin E 180 MG (400 UNITS) capsule, Take 400 Units by mouth daily., Disp: , Rfl:    Allergies  Allergen Reactions   Procardia [Nifedipine] Other (See Comments)    irratic heart rate      Men's preventive visit. Patient Health Questionnaire (PHQ-2) is  [place] Office Visit from 08/14/2020 in [facility]  PHQ-2 Total Score 0      Patient is on  a regular diet; states "I eat good". Exercises with walking with his job. Marital status: Divorced. Relevant history for alcohol use is:  Social History   Substance and Sexual Activity  Alcohol Use No  . Relevant history for tobacco use is:  Social History   Tobacco Use  Smoking Status Former   Packs/day: 1.00   Years: 20.00   Pack years: 20.00   Types: Cigarettes, Pipe, Cigars  Smokeless Tobacco Never  Tobacco Comments   12/02/2011 "stopped smoking ~ 30 yr ago"  .   Review of Systems  Constitutional: Negative.   HENT: Negative.    Eyes: Negative.   Respiratory: Negative.    Cardiovascular: Negative.  Negative for chest pain, palpitations and leg swelling.   Gastrointestinal: Negative.   Endocrine: Negative.   Genitourinary: Negative.   Musculoskeletal: Negative.   Skin: Negative.   Allergic/Immunologic: Negative.   Neurological: Negative.   Hematological: Negative.   Psychiatric/Behavioral: Negative.      Today's Vitals   01/01/21 1145  BP: 124/76  Pulse: 70  Temp: 98.1 F (36.7 C)  Weight: 210 lb 6.4 oz (95.4 kg)  Height: 6' 6.5" (1.994 m)  PainSc: 0-No pain   Body mass index is 24.01 kg/m.   Objective:  Physical Exam Vitals reviewed.  Constitutional:      General: He is not in acute distress.    Appearance: Normal appearance.  HENT:     Head: Normocephalic and atraumatic.     Right Ear: Tympanic membrane, ear canal and external ear normal. There is no impacted cerumen.     Left Ear: Tympanic membrane, ear canal and external ear normal. There is no impacted cerumen.     Nose:     Comments: Deferred - masked    Mouth/Throat:     Comments: Deferred - masked Eyes:     Pupils: Pupils are equal, round, and reactive to light.  Cardiovascular:     Rate and Rhythm: Normal rate and regular rhythm.     Pulses: Normal pulses.     Heart sounds: Normal heart sounds. No murmur heard. Pulmonary:     Effort: Pulmonary effort is normal. No respiratory distress.     Breath sounds: Normal breath sounds. No wheezing.  Abdominal:     General: Abdomen is flat. Bowel sounds are normal. There is no distension.     Palpations: Abdomen is soft.     Tenderness: There is no abdominal tenderness.  Genitourinary:    Prostate: Normal.     Rectum: Guaiac result negative.  Musculoskeletal:        General: No swelling or tenderness. Normal range of motion.     Cervical back: Normal range of motion and neck supple.  Skin:    General: Skin is warm and dry.     Capillary Refill: Capillary refill takes less than 2 seconds.     Comments: Healed surgical scar to mid and left lower abdomen.    Neurological:     General: No focal deficit present.      Mental Status: He is alert and oriented to person, place, and time.     Cranial Nerves: No cranial nerve deficit.     Motor: No weakness.  Psychiatric:        Mood and Affect: Mood normal.        Behavior: Behavior normal.        Thought Content: Thought content normal.        Judgment: Judgment normal.  Assessment And Plan:    1. Encounter for general adult medical examination w/o abnormal findings Behavior modifications discussed and diet history reviewed.   Pt will continue to exercise regularly and modify diet with low GI, plant based foods and decrease intake of processed foods.  Recommend intake of daily multivitamin, Vitamin D, and calcium.  Recommend mammogram and colonoscopy for preventive screenings, as well as recommend immunizations that include influenza, TDAP (Rx sent to pharmacy), and Shingles (does not want due to cost)  2. Benign hypertension with chronic kidney disease, stage III (HCC) Comments: Blood pressure is normal. Continue current medications - POCT Urinalysis Dipstick (81002) - POCT UA - Microalbumin - CMP14+EGFR  3. Mixed hyperlipidemia Comments: Stable, continue current medications. Tolerating well. - Lipid panel  4. Immunization due Rx sent to pharmacy. TDAP will be administered to adults 45-47 years old every 10 years. - Tdap (BOOSTRIX) 5-2.5-18.5 LF-MCG/0.5 injection; Inject 0.5 mLs into the muscle once for 1 dose.  Dispense: 0.5 mL; Refill: 0  5. Encounter for long-term (current) use of medications - CBC no Diff  6. History of colon cancer Comments: Continue follow up with Oncology  7. Herpes zoster vaccination declined  8. Nocturia Comments: will check PSA and prostate exam is normal. - PSA     Patient was given opportunity to ask questions. Patient verbalized understanding of the plan and was able to repeat key elements of the plan. All questions were answered to their satisfaction.   [name], FNP   I, [name],  FNP, have reviewed all documentation for this visit. The documentation on 01/01/21 for the exam, diagnosis, procedures, and orders are all accurate and complete.  THE PATIENT IS ENCOURAGED TO PRACTICE SOCIAL DISTANCING DUE TO THE COVID-19 PANDEMIC.

## 2021-01-02 LAB — LIPID PANEL
Chol/HDL Ratio: 4.3 ratio (ref 0.0–5.0)
Cholesterol, Total: 143 mg/dL (ref 100–199)
HDL: 33 mg/dL — ABNORMAL LOW (ref >39)
LDL Chol Calc (NIH): 86 mg/dL (ref 0–99)
Triglycerides: 133 mg/dL (ref 0–149)
VLDL Cholesterol Cal: 24 mg/dL (ref 5–40)

## 2021-01-02 LAB — CMP14+EGFR
ALT: 28 IU/L (ref 0–44)
AST: 27 IU/L (ref 0–40)
Albumin/Globulin Ratio: 1.6 (ref 1.2–2.2)
Albumin: 4.6 g/dL (ref 3.7–4.7)
Alkaline Phosphatase: 80 IU/L (ref 44–121)
BUN/Creatinine Ratio: 12 (ref 10–24)
BUN: 14 mg/dL (ref 8–27)
Bilirubin Total: 0.8 mg/dL (ref 0.0–1.2)
CO2: 25 mmol/L (ref 20–29)
Calcium: 9.4 mg/dL (ref 8.6–10.2)
Chloride: 101 mmol/L (ref 96–106)
Creatinine, Ser: 1.21 mg/dL (ref 0.76–1.27)
Globulin, Total: 2.8 g/dL (ref 1.5–4.5)
Glucose: 97 mg/dL (ref 70–99)
Potassium: 4.6 mmol/L (ref 3.5–5.2)
Sodium: 139 mmol/L (ref 134–144)
Total Protein: 7.4 g/dL (ref 6.0–8.5)
eGFR: 61 mL/min/{1.73_m2} (ref >59)

## 2021-01-02 LAB — CBC
Hematocrit: 46.9 % (ref 37.5–51.0)
Hemoglobin: 15.8 g/dL (ref 13.0–17.7)
MCH: 28.6 pg (ref 26.6–33.0)
MCHC: 33.7 g/dL (ref 31.5–35.7)
MCV: 85 fL (ref 79–97)
Platelets: 168 10*3/uL (ref 150–450)
RBC: 5.52 x10E6/uL (ref 4.14–5.80)
RDW: 14.8 % (ref 11.6–15.4)
WBC: 4 10*3/uL (ref 3.4–10.8)

## 2021-01-02 LAB — PSA: Prostate Specific Ag, Serum: 0.8 ng/mL (ref 0.0–4.0)

## 2021-01-14 ENCOUNTER — Ambulatory Visit (HOSPITAL_COMMUNITY): Payer: Medicare Other | Attending: Internal Medicine

## 2021-01-14 ENCOUNTER — Ambulatory Visit (INDEPENDENT_AMBULATORY_CARE_PROVIDER_SITE_OTHER): Payer: Medicare Other

## 2021-01-14 ENCOUNTER — Other Ambulatory Visit: Payer: Self-pay

## 2021-01-14 DIAGNOSIS — I10 Essential (primary) hypertension: Secondary | ICD-10-CM | POA: Diagnosis present

## 2021-01-14 DIAGNOSIS — Z024 Encounter for examination for driving license: Secondary | ICD-10-CM

## 2021-01-14 DIAGNOSIS — Z0000 Encounter for general adult medical examination without abnormal findings: Secondary | ICD-10-CM

## 2021-01-14 LAB — ECHOCARDIOGRAM COMPLETE
Area-P 1/2: 4.5 cm2
S' Lateral: 2.2 cm

## 2021-01-14 LAB — EXERCISE TOLERANCE TEST
Angina Index: 0
Estimated workload: 3.1
Exercise duration (min): 1 min
Exercise duration (sec): 0 s
MPHR: 142 {beats}/min
Peak HR: 151 {beats}/min
Percent HR: 106 %
RPE: 13
Rest HR: 98 {beats}/min

## 2021-01-16 ENCOUNTER — Telehealth: Payer: Self-pay | Admitting: Cardiovascular Disease

## 2021-01-16 NOTE — Telephone Encounter (Signed)
Patient states he dropped off a form that needs to be faxed over to his job.  He wants to make sure the stress test gets faxed to his job.

## 2021-01-16 NOTE — Telephone Encounter (Signed)
Attempted to contact pt to verify what paperwork and when it was left.  NA and no VM

## 2021-01-17 NOTE — Telephone Encounter (Signed)
Pt is returning call regarding paperwork to triage. Please advise pt further

## 2021-01-17 NOTE — Telephone Encounter (Signed)
Returned call to Pt.  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  Will mail copy to Pt  Pt given test results.  He will try to avoid salt.

## 2021-03-25 ENCOUNTER — Encounter: Payer: Self-pay | Admitting: Nurse Practitioner

## 2021-03-25 ENCOUNTER — Ambulatory Visit
Admission: RE | Admit: 2021-03-25 | Discharge: 2021-03-25 | Disposition: A | Discharge status: Home / Self Care | Payer: Medicare Other | Source: Ambulatory Visit | Attending: Nurse Practitioner | Admitting: Nurse Practitioner

## 2021-03-25 ENCOUNTER — Ambulatory Visit (INDEPENDENT_AMBULATORY_CARE_PROVIDER_SITE_OTHER): Payer: Medicare Other | Admitting: Nurse Practitioner

## 2021-03-25 ENCOUNTER — Other Ambulatory Visit: Payer: Self-pay

## 2021-03-25 VITALS — BP 142/88 | HR 95 | Temp 98.9°F | Ht 78.0 in | Wt 208.0 lb

## 2021-03-25 DIAGNOSIS — W19XXXA Unspecified fall, initial encounter: Secondary | ICD-10-CM

## 2021-03-25 DIAGNOSIS — M545 Low back pain, unspecified: Secondary | ICD-10-CM

## 2021-03-25 MED ORDER — KETOROLAC TROMETHAMINE 60 MG/2ML IM SOLN
60.0000 mg | Freq: Once | INTRAMUSCULAR | Status: AC
  Administered 2021-03-25: 60 mg via INTRAMUSCULAR

## 2021-03-25 MED ORDER — TRIAMCINOLONE ACETONIDE 40 MG/ML IJ SUSP
40.0000 mg | Freq: Once | INTRAMUSCULAR | Status: AC
  Administered 2021-03-25: 40 mg via INTRAMUSCULAR

## 2021-03-25 NOTE — Patient Instructions (Signed)
Flank Pain, Adult ?Flank pain is pain in your side. The flank is the area on your side between your upper belly (abdomen) and your spine. The pain may occur over a short time (acute), or it may be long-term or come back often (chronic). It may be mild or very bad. Pain in this area can be caused by many different things. ?Follow these instructions at home: ? ?Drink enough fluid to keep your pee (urine) pale yellow. ?Rest as told by your doctor. ?Take over-the-counter and prescription medicines only as told by your doctor. ?Keep a journal to keep track of: ?What has caused your flank pain. ?What has made your flank pain feel better. ?Keep all follow-up visits. ?Contact a doctor if: ?Medicine does not help your pain. ?You have new symptoms. ?Your pain gets worse. ?Your symptoms last longer than 2-3 days. ?You have trouble peeing. ?You are peeing more often than normal. ?Get help right away if: ?You have trouble breathing. ?You are short of breath. ?Your belly hurts, or it is swollen or red. ?You feel like you may vomit (nauseous). ?You vomit. ?You feel faint, or you faint. ?You have blood in your pee. ?You have flank pain and a fever. ?These symptoms may be an emergency. Get help right away. Call your local emergency services (911 in the [place] ?Do not wait to see if the symptoms will go away. ?Do not drive yourself to the hospital. ?Summary ?Flank pain is pain in your side. The flank is the area of your side between your upper belly (abdomen) and your spine. ?Flank pain may occur over a short time (acute), or it may be long-term or come back often (chronic). It may be mild or very bad. ?Pain in this area can be caused by many different things. ?Contact your doctor if your symptoms get worse or last longer than 2-3 days. ?This information is not intended to replace advice given to you by your health care provider. Make sure you discuss any questions you have with your health care provider. ?Document Revised:  04/08/2020 Document Reviewed: 04/08/2020 ?Elsevier Patient Education ? 2022 Elsevier Inc. ? ?

## 2021-03-25 NOTE — Progress Notes (Signed)
I,Victoria T Hamilton,acting as a Education administrator for [name], FNP.,have documented all relevant documentation on the behalf of [name], FNP,as directed by  [name], FNP while in the presence of [name], [place].  This visit occurred during the SARS-CoV-2 public health emergency.  Safety protocols were in place, including screening questions prior to the visit, additional usage of staff PPE, and extensive cleaning of exam room while observing appropriate contact time as indicated for disinfecting solutions.  Subjective:     Patient ID: Patrick Woodward , male    DOB: Mar 17, 1940 , 81 y.o.   MRN: [id number]   Chief Complaint  Patient presents with   Back Pain    HPI  Pt presents today for back pain, he reports it happened 3 weeks ago, ice developed in his drive way and he caught himself from falling. He still works currently, 5 days last week. Affecting his ability to walk. He did drive to [facility] for 5 days and mentions he should not have gone. He has been using a pain cream and patches, with mild relief.   Back Pain This is a new problem. The current episode started 1 to 4 weeks ago (3 weeks ago). The pain is present in the lumbar spine. The quality of the pain is described as aching. The pain does not radiate. The pain is at a severity of 10/10. The pain is severe. Pertinent negatives include no abdominal pain, bladder incontinence, bowel incontinence or numbness. Risk factors include sedentary lifestyle, lack of exercise and history of cancer. He has tried analgesics for the symptoms. The treatment provided no relief.    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 Take with or immediately following a meal., Disp: 90 tablet, Rfl: 1   Multiple Vitamin (MULTIVITAMIN PO), Take 1 tablet by mouth daily., Disp: , Rfl:    sildenafil (REVATIO) 20 MG tablet, Take 1 tablet by mouth once daily, Disp: 30 tablet, Rfl: 2   vitamin E 180 MG (400 UNITS) capsule, Take 400 Units by mouth daily., Disp: , Rfl:    Allergies  Allergen Reactions   Procardia [Nifedipine] Other (See Comments)    irratic heart rate      Review of Systems  Constitutional: Negative.   HENT: Negative.    Gastrointestinal: Negative.  Negative for abdominal pain and bowel incontinence.  Genitourinary:  Negative for bladder incontinence.  Musculoskeletal:  Positive for back pain.  Skin: Negative.   Allergic/Immunologic: Negative.   Neurological: Negative.  Negative for numbness.  Hematological: Negative.     Today's Vitals  03/25/21 1130  BP: (!) 142/88  Pulse: 95  Temp: 98.9 F (37.2 C)  Weight: 208 lb (94.3 kg)  Height: 6\' 6"  (1.981 m)   Body mass index is 24.04 kg/m.  Wt Readings from Last 3 Encounters:  03/25/21 208 lb (94.3 kg)  01/01/21 210 lb 6.4 oz (95.4 kg)  12/23/20 213 lb (96.6 kg)    Objective:  Physical Exam Vitals reviewed.  Constitutional:      General: He is not in acute distress.    Appearance: Normal appearance.  Cardiovascular:     Rate and Rhythm: Normal rate and regular rhythm.     Pulses: Normal pulses.     Heart sounds: Normal heart sounds. No murmur heard. Pulmonary:     Effort: Pulmonary effort is normal. No  respiratory distress.     Breath sounds: Normal breath sounds. No wheezing.  Musculoskeletal:        General: Tenderness (pain to low mid back and has swelling noted) present. No deformity.     Right lower leg: No edema.     Left lower leg: No edema.     Comments: Severe pain when lying down and his movement is slower  Skin:    General: Skin is warm and dry.     Capillary Refill: Capillary refill takes less than 2 seconds.  Neurological:     General: No focal deficit present.     Mental Status: He is alert and oriented to person, place, and time.     Cranial Nerves: No cranial nerve deficit.     Motor: No weakness.  Psychiatric:        Mood and Affect: Mood normal.        Behavior: Behavior normal.        Thought Content: Thought content normal.        Judgment: Judgment normal.        Assessment And Plan:     1. Acute midline low back pain without sciatica Comments: pain to low back and swelling noted, will treat with kenalog and toradol, continue to take Tylenol for pain and apply pain patch - ketorolac (TORADOL) injection 60 mg - triamcinolone acetonide (KENALOG-40) injection 40 mg - DG Lumbar Spine Complete; Future  2. Fall, initial encounter Comments: discussed the importance to not be outside when the temperature is below freezing due to risk of ice     Patient was given opportunity to ask questions. Patient verbalized understanding of the plan and was able to repeat key elements of the plan. All questions were answered to their satisfaction.  [name], FNP   I, [name], FNP, have reviewed all documentation for this visit. The documentation on 03/25/21 for the exam, diagnosis, procedures, and orders are all accurate and complete.   IF YOU HAVE BEEN REFERRED TO A SPECIALIST, IT MAY TAKE 1-2 WEEKS TO SCHEDULE/PROCESS THE REFERRAL. IF YOU HAVE NOT HEARD FROM US/SPECIALIST IN TWO WEEKS, PLEASE GIVE [place] A CALL AT [phone].   THE PATIENT IS ENCOURAGED TO  PRACTICE SOCIAL DISTANCING DUE TO THE COVID-19 PANDEMIC.

## 2021-03-31 NOTE — Progress Notes (Signed)
Would he like a referral to see an orthopedic?

## 2021-04-01 ENCOUNTER — Other Ambulatory Visit: Payer: Self-pay

## 2021-04-01 DIAGNOSIS — W19XXXA Unspecified fall, initial encounter: Secondary | ICD-10-CM

## 2021-04-01 DIAGNOSIS — M545 Low back pain, unspecified: Secondary | ICD-10-CM

## 2021-04-01 NOTE — Progress Notes (Signed)
Yes and fall

## 2021-04-07 ENCOUNTER — Other Ambulatory Visit: Payer: Self-pay | Admitting: Nurse Practitioner

## 2021-04-07 DIAGNOSIS — M545 Low back pain, unspecified: Secondary | ICD-10-CM

## 2021-04-07 MED ORDER — TRAZODONE HCL 50 MG PO TABS: 50.0000 mg | ORAL_TABLET | Freq: Every day | ORAL | 0 refills | Status: DC

## 2021-04-07 MED ORDER — TRAMADOL HCL 50 MG PO TABS: 50.0000 mg | ORAL_TABLET | Freq: Four times a day (QID) | ORAL | 0 refills | Status: AC | PRN

## 2021-04-14 ENCOUNTER — Ambulatory Visit (INDEPENDENT_AMBULATORY_CARE_PROVIDER_SITE_OTHER): Payer: Medicare Other | Admitting: Physician Assistant

## 2021-04-14 ENCOUNTER — Encounter: Payer: Self-pay | Admitting: Physician Assistant

## 2021-04-14 DIAGNOSIS — M545 Low back pain, unspecified: Secondary | ICD-10-CM | POA: Diagnosis not present

## 2021-04-14 MED ORDER — METHYLPREDNISOLONE 4 MG PO TABS: ORAL_TABLET | ORAL | 0 refills | Status: DC

## 2021-04-14 NOTE — Progress Notes (Signed)
? ?Office Visit Note ?  ?Patient: Patrick Woodward           ?Date of Birth: 1940/11/06           ?MRN: [id number] ?Visit Date: 04/14/2021 ?             ?Requested by: [name], [place] ?[address] ?[address] ?[place],  [place] [address] ?PCP: [name], FNP ? ? ?Assessment & Plan: ?Visit Diagnoses:  ?1. Lumbar pain   ? ? ?Plan: Patient is nondiabetic.  We will place him on Medrol Dosepak.  He will continue taking just Tylenol for pain.  We will also send him to formal physical therapy for core strengthening, back exercises, stretching particularly hamstrings, modalities and home exercise program.  We will see him back in 4 weeks see how he is doing overall.  Questions were encouraged and answered ? ? ? ? ?Follow-Up Instructions: Return in about 4 weeks (around 05/12/2021).  ? ?Orders:  ?No orders of the defined types were placed in this encounter. ? ?Meds ordered this encounter  ?Medications  ? methylPREDNISolone (MEDROL) 4 MG tablet  ?  Sig: Take as directed  ?  Dispense:  21 tablet  ?  Refill:  0  ? ? ? ? Procedures: ?No procedures performed ? ? ?Clinical Data: ?No additional findings. ? ? ?Subjective: ?Chief Complaint  ?Patient presents with  ? Lower Back - Pain  ? ? ?HPI ?Patrick Woodward is a 81 year old male comes in today with low back pain has been ongoing for more than a month.  He does report that he was having some back pain before he fell 1 month ago.  He states he fell on some ice in his driveway.  States the back pain is worse since the fall.  He has seen his primary care physician who obtained x-rays of his lumbar spine.  Personally reviewed the radiographs.  This shows diffuse degenerative changes with anterior endplate spurring.  No spinal listhesis.  No acute fractures.  Facet arthritic changes are noted.  He has been taking Tylenol and tramadol also had a Toradol injection which helped some.  However he still having a out of 10 pain when bending over.  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  He denies any waking pain.  Denies any numbness tingling.  Denies any saddle anesthesia.  Denies any bowel bladder dysfunction. ? ?Review of Systems  ?Constitutional:  Negative for chills and fever.  ?Gastrointestinal:  Negative for constipation.  ?Genitourinary:  Negative for decreased urine volume.  ?Musculoskeletal:  Positive for back pain.  ?Neurological:  Negative for numbness.  ? ? ?Objective: ?Vital Signs: There were no vitals taken for this visit. ? ?Physical Exam ?Constitutional:   ?   Appearance: He is normal weight. He is not ill-appearing or diaphoretic.  ?Cardiovascular:  ?   Pulses: Normal pulses.  ?Pulmonary:  ?   Effort: Pulmonary effort is normal.  ?Neurological:  ?   Mental Status: He is alert and oriented to person, place, and time.  ?Psychiatric:     ?   Mood and Affect: Mood normal.     ?   Behavior: Behavior normal.  ? ? ?Ortho Exam ?Lower extremities 5 out of 5 strength throughout the lower extremities against resistance.  Full sensation bilateral feet to light touch.  Straight leg raise is positive bilaterally.  Exquisitely tight hamstrings bilaterally.  He has diminished flexion extension lumbar  spine.  The lumbar bar spine flexion causes pain in the lower back.  Tenderness over the lower lumbar spinal column. ?Specialty Comments:  ?No specialty comments available. ? ?Imaging: ?No results found. ? ? ?PMFS History: ?Patient Active Problem List  ? Diagnosis Date Noted  ? Essential hypertension 04/25/2018  ? Mixed hyperlipidemia 04/25/2018  ? Erectile dysfunction 04/25/2018  ? History of colon cancer 04/25/2018  ? Benign neoplasm of colon 08/28/2013  ? Colon cancer ([place]) 11/30/2010  ? ?Past Medical History:  ?Diagnosis Date  ? Colon cancer ([place]) 10/2010  ? Stage III-mod differentiated adeno  ? GERD (gastroesophageal reflux disease)   ? Hypercholesteremia   ? Hypertension   ? Neuropathy   ? 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  ?Family  History  ?Problem Relation Age of Onset  ? Cancer Mother   ?     colon  ? Cancer Father   ?     prostate  ?  ?Past Surgical History:  ?Procedure Laterality Date  ? COLON SURGERY  10/20/10  ? colon resection w/colostomy/Hartmann pouch procedure  ? COLONOSCOPY    ? COLONOSCOPY N/A 08/28/2013  ? Procedure: COLONOSCOPY;  Surgeon: Lear Ng, MD;  Location: [facility];  Service: Endoscopy;  Laterality: N/A;  ? COLOSTOMY CLOSURE  12/02/2011  ? COLOSTOMY CLOSURE  12/02/2011  ? Procedure: COLOSTOMY CLOSURE;  Surgeon: Joyice Faster. Cornett, MD;  Location: [facility];  Service: General;  Laterality: N/A;  ? HOT HEMOSTASIS N/A 08/28/2013  ? Procedure: HOT HEMOSTASIS (ARGON PLASMA COAGULATION/BICAP);  Surgeon: Lear Ng, MD;  Location: [facility];  Service: Endoscopy;  Laterality: N/A;  ? PORT-A-CATH REMOVAL  12/02/2011  ? PORT-A-CATH REMOVAL  12/02/2011  ? Procedure: REMOVAL PORT-A-CATH;  Surgeon: Joyice Faster. Cornett, MD;  Location: [facility];  Service: General;  Laterality: Left;  ? PORTACATH PLACEMENT  11/27/10  ? Dr. Judeen Hammans chest  ? ?Social History  ? ?Occupational History  ? Occupation: retired  ?Tobacco Use  ? Smoking status: Former  ?  Packs/day: 1.00  ?  Years: 20.00  ?  Pack years: 20.00  ?  Types: Cigarettes, Pipe, Cigars  ? Smokeless tobacco: Never  ? Tobacco comments:  ?  12/02/2011 "stopped smoking ~ 30 yr ago"  ?Vaping Use  ? Vaping Use: Never used  ?Substance and Sexual Activity  ? Alcohol use: No  ? Drug use: No  ? Sexual activity: Yes  ? ? ? ? ? ? ?

## 2021-04-14 NOTE — Addendum Note (Signed)
Addended by: Robyne Peers on: 04/14/2021 11:22 AM ? ? Modules accepted: Orders ? ?

## 2021-04-24 ENCOUNTER — Other Ambulatory Visit: Payer: Self-pay | Admitting: Nurse Practitioner

## 2021-04-25 ENCOUNTER — Encounter: Payer: Self-pay | Admitting: Rehabilitative and Restorative Service Providers"

## 2021-04-25 ENCOUNTER — Ambulatory Visit: Payer: Medicare Other | Admitting: Rehabilitative and Restorative Service Providers"

## 2021-04-25 ENCOUNTER — Other Ambulatory Visit: Payer: Self-pay

## 2021-04-25 DIAGNOSIS — M6281 Muscle weakness (generalized): Secondary | ICD-10-CM | POA: Diagnosis not present

## 2021-04-25 DIAGNOSIS — R293 Abnormal posture: Secondary | ICD-10-CM

## 2021-04-25 DIAGNOSIS — M545 Low back pain, unspecified: Secondary | ICD-10-CM | POA: Diagnosis not present

## 2021-04-25 NOTE — Therapy (Signed)
?OUTPATIENT PHYSICAL THERAPY THORACOLUMBAR EVALUATION ? ? ?Patient Name: Patrick Woodward ?MRN: [id number] ?DOB:12-04-1940, 81 y.o., male ?Today's Date: 04/25/2021 ? ? PT End of Session - 04/25/21 1408   ? ? Visit Number 1   ? Number of Visits 16   ? Date for PT Re-Evaluation 06/20/21   ? Progress Note Due on Visit 10   ? PT Start Time 1015   ? PT Stop Time 1100   ? PT Time Calculation (min) 45 min   ? Activity Tolerance Patient tolerated treatment well;No increased pain   ? Behavior During Therapy [facility] for tasks assessed/performed   ? ?  ?  ? ?  ? ? ?Past Medical History:  ?Diagnosis Date  ? Colon cancer ([place]) 10/2010  ? Stage III-mod differentiated adeno  ? GERD (gastroesophageal reflux disease)   ? Hypercholesteremia   ? Hypertension   ? Neuropathy   ? 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 ?Past Surgical History:  ?Procedure Laterality Date  ? COLON SURGERY  10/20/10  ? colon resection w/colostomy/Hartmann pouch procedure  ? COLONOSCOPY    ? COLONOSCOPY N/A 08/28/2013  ? Procedure: COLONOSCOPY;  Surgeon: Lear Ng, MD;  Location: [facility];  Service: Endoscopy;  Laterality: N/A;  ? COLOSTOMY CLOSURE  12/02/2011  ? COLOSTOMY CLOSURE  12/02/2011  ? Procedure: COLOSTOMY CLOSURE;  Surgeon: Joyice Faster. Cornett, MD;  Location: [facility];  Service: General;  Laterality: N/A;  ? HOT HEMOSTASIS N/A 08/28/2013  ? Procedure: HOT HEMOSTASIS (ARGON PLASMA COAGULATION/BICAP);  Surgeon: Lear Ng, MD;  Location: [facility];  Service: Endoscopy;  Laterality: N/A;  ? PORT-A-CATH REMOVAL  12/02/2011  ? PORT-A-CATH REMOVAL  12/02/2011  ? Procedure: REMOVAL PORT-A-CATH;  Surgeon: Joyice Faster. Cornett, MD;  Location: [facility];  Service: General;  Laterality: Left;  ? PORTACATH PLACEMENT  11/27/10  ? Dr. Judeen Hammans chest  ? ?Patient Active Problem List  ? Diagnosis Date Noted  ? Essential hypertension 04/25/2018  ? Mixed hyperlipidemia 04/25/2018  ? Erectile dysfunction 04/25/2018  ? History of colon cancer 04/25/2018   ? Benign neoplasm of colon 08/28/2013  ? Colon cancer ([place]) 11/30/2010  ? ? ?PCP: [name], FNP ? ?REFERRING PROVIDER: Pete Pelt, PA-C ? ?REFERRING DIAG: Access Code: [email or web address] ?URL: [email or web address] ?Date: 04/25/2021 ?Prepared by: Vista Mink ? ?REFERRING DIAGNOSIS: M54.50 (ICD-10-CM) - Lumbar pain  ? ? ?THERAPY DIAG:  ?Abnormal posture ? ?Muscle weakness (generalized) ? ?Acute bilateral low back pain without sciatica ? ?ONSET DATE: 2 months ? ?SUBJECTIVE:                                                                                                                                                                                          ? ?  SUBJECTIVE STATEMENT: ?Roshad notes difficulty with bending and flexed postures.   ? ?PERTINENT HISTORY:  ?HTN, neuropathy, history of colon CA, former smoker ? ?PAIN:  ?Are you having pain? Yes: NPRS scale: 0-6/10 ?Pain location: Low back ?Pain description: Hurt ?Aggravating factors: Flexion (like tying shoes) and prolonged sitting (drives a bus) ?Relieving factors: Pain medication or lie down ? ? ?PRECAUTIONS: Back ? ?WEIGHT BEARING RESTRICTIONS No ? ?FALLS:  ?Has patient fallen in last 6 months? Yes, Number of falls: 1 related to ice ? ?LIVING ENVIRONMENT: ?Lives with: lives with their family ? ?OCCUPATION: Part-time bus driver 2 days a week ? ?PLOF: Independent ? ?PATIENT GOALS Return to work without pain, tie shoes. ? ? ?OBJECTIVE:  ? ?DIAGNOSTIC FINDINGS:  ?Degenerative changes with spurring and ?osteophyte most prominent at L2-L3. Lower lumbar facet arthropathy ? ?PATIENT SURVEYS:  ?FOTO 50 (Goal 70) ? ?SCREENING FOR RED FLAGS: ?Bowel or bladder incontinence: No ?Spinal tumors: No ?Cauda equina syndrome: No ?Compression fracture: No ? ?COGNITION: ? Overall cognitive status: Within functional limits for tasks assessed   ?  ? ?MUSCLE LENGTH: ?Hamstrings: Right 30 deg; Left 20 deg ? ?POSTURE:  ?Forward head, IR and protracted shoulders and  decreased lumbar lordosis ? ?LUMBAR ROM:  ? ?Active  A/PROM  ?04/25/2021  ?Flexion   ?Extension 0  ?Right lateral flexion   ?Left lateral flexion   ?Right rotation   ?Left rotation   ? (Blank rows = not tested) ? ?LE ROM: ? ?Active  Right ?04/25/2021 Left ?04/25/2021  ?Hip flexion 75 75  ?Hip extension    ?Hip abduction    ?Hip adduction    ?Hip internal rotation    ?Hip external rotation    ?Knee flexion    ?Knee extension    ?Ankle dorsiflexion    ?Ankle plantarflexion    ?Ankle inversion    ?Ankle eversion    ? (Blank rows = not tested) ? ?LE MMT: ? ?MMT Right ?04/25/2021 Left ?04/25/2021  ?Hip flexion    ?Hip extension    ?Hip abduction    ?Hip adduction    ?Hip internal rotation    ?Hip external rotation    ?Knee flexion    ?Knee extension    ?Ankle dorsiflexion    ?Ankle plantarflexion    ?Ankle inversion    ?Ankle eversion    ? (Blank rows = not tested) ? ? ?TODAY'S TREATMENT  ?Functional Activities: Reviewed exam findings, postural education using spine model and reviewed imaging.  Encouraged log roll and lumbar roll use.  Encouraged walking 3X/week for 20 minutes and to avoid slouching and flexed postures. ? ?Therapeutic exercises:  ? ?Lumbar extension AROM 10X 3 seconds ? ?Heel to toe raises with transversus abdominus activation 10X 3 seconds ? ?Single knee to chest stretch (other leg straight) supine 4X 20 seconds B ? ?Supine hamstrings stretch (90/90) 4X 20 seconds B ? ? ?PATIENT EDUCATION:  ?Education details: See above ?Person educated: Patient ?Education method: Explanation, Demonstration, Tactile cues, Verbal cues, and Handouts ?Education comprehension: verbalized understanding, returned demonstration, verbal cues required, tactile cues required, and needs further education ? ? ?HOME EXERCISE PROGRAM: ?Access Code: [email or web address] ?URL: [email or web address] ?Date: 04/25/2021 ?Prepared by: Vista Mink ? ?Exercises ?Single Knee to Chest Stretch - 2-3 x daily - 7 x weekly - 1 sets - 5 reps - 20  seconds hold ?Supine Hamstring Stretch - 2-3 x daily - 7 x weekly - 1 sets - 5 reps - 20 seconds hold ?Standing Lumbar Extension at [employer] -  Forearms - 5 x daily - 7 x weekly - 1 sets - 5 reps - 3 seconds hold ?Heel Toe Raises with Counter Support - 2-3 x daily - 7 x weekly - 1 sets - 10 reps - 3 seconds hold ? ? ?ASSESSMENT: ? ?CLINICAL IMPRESSION: ?Patient is a 81 y.o. male who was seen today for physical therapy evaluation and treatment for acute low back pain.  Jakavion is '6\' 5"'$  tall and is used to having to bend to function.  He will benefit from postural and practical body mechanics work, trunk extension AROM, LE flexibility and spine strength work to meet long-term goals. ? ? ?OBJECTIVE IMPAIRMENTS decreased activity tolerance, decreased knowledge of condition, decreased ROM, decreased strength, decreased safety awareness, impaired perceived functional ability, increased muscle spasms, impaired flexibility, improper body mechanics, postural dysfunction, and pain.  ? ?ACTIVITY LIMITATIONS community activity and driving.  ? ?PERSONAL FACTORS HTN, neuropathy, history of colon CA, former smoker are also affecting patient's functional outcome.  ? ? ?REHAB POTENTIAL: Good ? ?CLINICAL DECISION MAKING: Stable/uncomplicated ? ?EVALUATION COMPLEXITY: Low ? ? ?GOALS: ?Goals reviewed with patient? Yes ? ?SHORT TERM GOALS: Target date: 06/20/2021 ? ?Improve trunk extension AROM to 10 degrees. ?Baseline: 0 degrees ?Goal status: INITIAL ? ?2.  Hewitt will be independent with his day 1 HEP at [place]. ?Baseline: Started 04/25/2021 ?Goal status: INITIAL ? ?LONG TERM GOALS: Target date: 06/20/2021 ? ?Improve FOTO to 70. ?Baseline: 50 ?Goal status: INITIAL ? ?2.  Improve low back pain to consistently 0-2/10 on the Numeric Pain Rating Scale. ?Baseline: Can be 6/10 ?Goal status: INITIAL ? ?3.  Improve B LE flexibility for hip flexors to 100; hamstrings to 45 degrees. ?Baseline: See objective ?Goal status: INITIAL ? ?4.  Improve spine  strength as assessed by posture, spine strength testing and FOTO scores. ?Baseline: Flexed posture and 50 FOTO score ?Goal status: INITIAL ? ?5.  [place] will be independent with his long-term HEP at [place]. ?[place]

## 2021-05-12 ENCOUNTER — Encounter: Payer: Self-pay | Admitting: Physical Therapy

## 2021-05-12 ENCOUNTER — Ambulatory Visit: Payer: Medicare Other | Admitting: Physical Therapy

## 2021-05-12 DIAGNOSIS — M545 Low back pain, unspecified: Secondary | ICD-10-CM | POA: Diagnosis not present

## 2021-05-12 DIAGNOSIS — M6281 Muscle weakness (generalized): Secondary | ICD-10-CM

## 2021-05-12 DIAGNOSIS — R293 Abnormal posture: Secondary | ICD-10-CM | POA: Diagnosis not present

## 2021-05-12 NOTE — Therapy (Signed)
?OUTPATIENT PHYSICAL THERAPY TREATMENT  ? ? ?Patient Name: Patrick Woodward ?MRN: [id number] ?DOB:[birth year], 81 y.o., male ?Today's Date: 05/12/2021 ? ? PT End of Session - 05/12/21 1009   ? ? Visit Number 2   ? Number of Visits 16   ? Date for PT Re-Evaluation 06/20/21   ? Progress Note Due on Visit 10   ? PT Start Time [phone]   ? PT Stop Time 1010   ? PT Time Calculation (min) 39 min   ? Activity Tolerance Patient tolerated treatment well;No increased pain   ? Behavior During Therapy [facility] for tasks assessed/performed   ? ?  ?  ? ?  ? ? ? ?Past Medical History:  ?Diagnosis Date  ? Colon cancer ([place]) 10/2010  ? Stage III-mod differentiated adeno  ? GERD (gastroesophageal reflux disease)   ? Hypercholesteremia   ? Hypertension   ? Neuropathy   ? 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 ?Past Surgical History:  ?Procedure Laterality Date  ? COLON SURGERY  10/20/10  ? colon resection w/colostomy/Hartmann pouch procedure  ? COLONOSCOPY    ? COLONOSCOPY N/A 08/28/2013  ? Procedure: COLONOSCOPY;  Surgeon: Lear Ng, MD;  Location: [facility];  Service: Endoscopy;  Laterality: N/A;  ? COLOSTOMY CLOSURE  12/02/2011  ? COLOSTOMY CLOSURE  12/02/2011  ? Procedure: COLOSTOMY CLOSURE;  Surgeon: Joyice Faster. Cornett, MD;  Location: [place];  Service: General;  Laterality: N/A;  ? HOT HEMOSTASIS N/A 08/28/2013  ? Procedure: HOT HEMOSTASIS (ARGON PLASMA COAGULATION/BICAP);  Surgeon: Lear Ng, MD;  Location: [facility];  Service: Endoscopy;  Laterality: N/A;  ? PORT-A-CATH REMOVAL  12/02/2011  ? PORT-A-CATH REMOVAL  12/02/2011  ? Procedure: REMOVAL PORT-A-CATH;  Surgeon: Joyice Faster. Cornett, MD;  Location: [facility];  Service: General;  Laterality: Left;  ? PORTACATH PLACEMENT  11/27/10  ? Dr. Judeen Hammans chest  ? ?Patient Active Problem List  ? Diagnosis Date Noted  ? Essential hypertension 04/25/2018  ? Mixed hyperlipidemia 04/25/2018  ? Erectile dysfunction 04/25/2018  ? History of colon cancer 04/25/2018  ? Benign  neoplasm of colon 08/28/2013  ? Colon cancer ([place]) 11/30/2010  ? ? ?PCP: [name], FNP ? ?REFERRING PROVIDER: Pete Pelt, PA-C ? ?REFERRING DIAG: Access Code: [email or web address] ?URL: [email or web address] ?Date: 04/25/2021 ?Prepared by: Vista Mink ? ?REFERRING DIAGNOSIS: M54.50 (ICD-10-CM) - Lumbar pain  ? ? ?THERAPY DIAG:  ?Abnormal posture ? ?Muscle weakness (generalized) ? ?Acute bilateral low back pain without sciatica ? ?ONSET DATE: 2 months ? ?SUBJECTIVE:                                                                                                                                                                                          ? ?  SUBJECTIVE STATEMENT: ?Pt stating his pain is 5/10 today upon arrival. 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  Pt stating he is now able to put his shoes on with less pain.  ? ?PERTINENT HISTORY:  ?HTN, neuropathy, history of colon CA, former smoker ? ?PAIN:  ?Are you having pain? Yes: NPRS scale: 5/10 ?Pain location: Low back ?Pain description: Hurt ?Aggravating factors: Flexion (like tying shoes) and prolonged sitting (drives a bus) ?Relieving factors: Pain medication or lie down ? ? ?PRECAUTIONS: Back ? ?WEIGHT BEARING RESTRICTIONS No ? ?FALLS:  ?Has patient fallen in last 6 months? Yes, Number of falls: 1 related to ice ? ?LIVING ENVIRONMENT: ?Lives with: lives with their family ? ?OCCUPATION: Part-time bus driver 2 days a week ? ?PLOF: Independent ? ?PATIENT GOALS Return to work without pain, tie shoes. ? ? ?OBJECTIVE:  ? ?DIAGNOSTIC FINDINGS:  ?Degenerative changes with spurring and ?osteophyte most prominent at L2-L3. Lower lumbar facet arthropathy ? ?PATIENT SURVEYS:  ?FOTO 50 (Goal 70) ? ?SCREENING FOR RED FLAGS: ?Bowel or bladder incontinence: No ?Spinal tumors: No ?Cauda equina syndrome: No ?Compression fracture: No ? ?COGNITION: ? Overall cognitive status: Within functional limits for tasks assessed   ?  ? ?MUSCLE  LENGTH: ?Hamstrings: Right 30 deg; Left 20 deg ? ?POSTURE:  ?Forward head, IR and protracted shoulders and decreased lumbar lordosis ? ?LUMBAR ROM:  ? ?Active  A/PROM  ?04/25/2021 AROM ?05/12/2021  ?Flexion  30  ?Extension 0 5  ?Right lateral flexion  24  ?Left lateral flexion  20  ?Right rotation  Limited 75%  ?Left rotation  Limited 75%  ? (Blank rows = not tested) ? ?LE ROM: ? ?Active  Right ?04/25/2021 Left ?04/25/2021  ?Hip flexion 75 75  ?Hip extension    ?Hip abduction    ?Hip adduction    ?Hip internal rotation    ?Hip external rotation    ?Knee flexion    ?Knee extension    ?Ankle dorsiflexion    ?Ankle plantarflexion    ?Ankle inversion    ?Ankle eversion    ? (Blank rows = not tested) ? ?LE MMT: ? ?MMT Right ?05/12/2021 Left ?05/12/2021  ?Hip flexion 4/5 4-/5  ?Hip extension    ?Hip abduction 4/5 4-/5  ?Hip adduction 4/5 4-/5  ?Hip internal rotation    ?Hip external rotation    ?Knee flexion 5/5 5/5  ?Knee extension 5/5 5/5  ? (Blank rows = not tested) ? ? ?TODAY'S TREATMENT  ?05/12/2021: ?Supine trunk rotation x 3 each side 20 seconds ?Supine hamstring stretches x 2 each LE 30 seconds ?Standing trunk extension x 10 holding 5 seconds elbows against wall ?Bridges 2 x 10 unable to hold more than 3 seconds ?Hip flexor stretch x 3 holding 30 seconds ?Piriformis stretch pushing knee away in figure 4 x 2 each LE 20 seconds ?Recumbent bike: x 5 minutes L4 seat at 10 ? ? ?04/25/2021:  ?Functional Activities: Reviewed exam findings, postural education using spine model and reviewed imaging.  Encouraged log roll and lumbar roll use.  Encouraged walking 3X/week for 20 minutes and to avoid slouching and flexed postures. ? ?Therapeutic exercises:  ? ?Lumbar extension AROM 10X 3 seconds ? ?Heel to toe raises with transversus abdominus activation 10X 3 seconds ? ?Single knee to chest stretch (other leg straight) supine 4X 20 seconds B ? ?Supine hamstrings stretch (90/90) 4X 20 seconds B ? ? ?PATIENT EDUCATION:  ?Education details:  See above ?Person educated:  Patient ?Education method: Explanation, Demonstration, Tactile cues, Verbal cues, and Handouts ?Education comprehension: verbalized understanding, returned demonstration, verbal cues required, tactile cues required, and needs further education ? ? ?HOME EXERCISE PROGRAM: ?Access Code: [email or web address] ?URL: [email or web address] ?Date: 04/25/2021 ?Prepared by: Vista Mink ? ?Exercises ?Single Knee to Chest Stretch - 2-3 x daily - 7 x weekly - 1 sets - 5 reps - 20 seconds hold ?Supine Hamstring Stretch - 2-3 x daily - 7 x weekly - 1 sets - 5 reps - 20 seconds hold ?Standing Lumbar Extension at [place] 5 x daily - 7 x weekly - 1 sets - 5 reps - 3 seconds hold ?Heel Toe Raises with Counter Support - 2-3 x daily - 7 x weekly - 1 sets - 10 reps - 3 seconds hold ? ? ?ASSESSMENT: ? ?CLINICAL IMPRESSION: ?05/12/2021:  ?Pt arriving today reporting mild improvements since beginning his stretches that were issued at his initial evaluation. Pt tolerating all exercises well. Pt's lumbar mobility was measured and LE stretch with deficits noted. Pt with significant limitations in trunk rotation and extension. Recommending continue skilled therapy to progress pt toward his LTG's.  ? ?04/25/2021:  ?Patient is a 81 y.o. male who was seen today for physical therapy evaluation and treatment for acute low back pain.  Catherine is '6\' 5"'$  tall and is used to having to bend to function.  He will benefit from postural and practical body mechanics work, trunk extension AROM, LE flexibility and spine strength work to meet long-term goals. ? ? ?OBJECTIVE IMPAIRMENTS decreased activity tolerance, decreased knowledge of condition, decreased ROM, decreased strength, decreased safety awareness, impaired perceived functional ability, increased muscle spasms, impaired flexibility, improper body mechanics, postural dysfunction, and pain.  ? ?ACTIVITY LIMITATIONS community activity and driving.  ? ?PERSONAL  FACTORS HTN, neuropathy, history of colon CA, former smoker are also affecting patient's functional outcome.  ? ? ?REHAB POTENTIAL: Good ? ?CLINICAL DECISION MAKING: Stable/uncomplicated ? ?EVALUATION COMPLEXITY: Lo

## 2021-05-20 ENCOUNTER — Encounter: Payer: Medicare Other | Admitting: Rehabilitative and Restorative Service Providers"

## 2021-05-27 ENCOUNTER — Encounter: Payer: Medicare Other | Admitting: Rehabilitative and Restorative Service Providers"

## 2021-06-03 ENCOUNTER — Ambulatory Visit (INDEPENDENT_AMBULATORY_CARE_PROVIDER_SITE_OTHER): Payer: Medicare Other | Admitting: Rehabilitative and Restorative Service Providers"

## 2021-06-03 ENCOUNTER — Other Ambulatory Visit: Payer: Self-pay

## 2021-06-03 ENCOUNTER — Encounter: Payer: Self-pay | Admitting: Rehabilitative and Restorative Service Providers"

## 2021-06-03 DIAGNOSIS — R293 Abnormal posture: Secondary | ICD-10-CM | POA: Diagnosis not present

## 2021-06-03 DIAGNOSIS — M545 Low back pain, unspecified: Secondary | ICD-10-CM

## 2021-06-03 DIAGNOSIS — M6281 Muscle weakness (generalized): Secondary | ICD-10-CM

## 2021-06-03 NOTE — Therapy (Addendum)
OUTPATIENT PHYSICAL THERAPY TREATMENT    Patient Name: Patrick Woodward MRN: [id number] DOB:17-Nov-1940, 81 y.o., male Today's Date: 06/03/2021  PHYSICAL THERAPY DISCHARGE SUMMARY  Visits from Start of Care: 3  Current functional level related to goals / functional outcomes: See note   Remaining deficits: See note   Education / Equipment: HEP   Patient agrees to discharge. Patient goals were  making progress but not met . Patient is being discharged due to not returning since the last visit.    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 Cornett, MD;  Location: [facility];  Service: General;  Laterality: N/A;   HOT HEMOSTASIS N/A 08/28/2013   Procedure: HOT HEMOSTASIS (ARGON PLASMA COAGULATION/BICAP);  Surgeon: Lear Ng, MD;  Location: [facility];  Service: Endoscopy;  Laterality: N/A;   PORT-A-CATH REMOVAL  12/02/2011   PORT-A-CATH REMOVAL  12/02/2011   Procedure: REMOVAL PORT-A-CATH;   Surgeon: Joyice Faster. Cornett, MD;  Location: [employer] OR;  Service: General;  Laterality: Left;   PORTACATH PLACEMENT  11/27/10   Dr. Judeen Hammans chest   Patient Active Problem List   Diagnosis Date Noted   Essential hypertension 04/25/2018   Mixed hyperlipidemia 04/25/2018   Erectile dysfunction 04/25/2018   History of colon cancer 04/25/2018   Benign neoplasm of colon 08/28/2013   Colon cancer ([place]) 11/30/2010    PCP: [name], FNP  REFERRING PROVIDER: Pete Pelt, PA-C  REFERRING DIAGNOSIS: M54.50 (ICD-10-CM) - Lumbar pain    THERAPY DIAG:  Abnormal posture  Muscle weakness (generalized)  Acute bilateral low back pain without sciatica  ONSET DATE: 2 months  SUBJECTIVE:  SUBJECTIVE STATEMENT: Pt indicated doing better, pain at worst 4-5/10.  Pt indicated it previous hurt to move but it has gotten better.    PERTINENT HISTORY:  HTN, neuropathy, history of colon CA, former smoker  PAIN:  Are you having pain? Yes: NPRS scale: 4-5/10 at worst, 0/10 at best Pain location: Low back Pain description: Hurt Aggravating factors: bending over Relieving factors: treatment has helped.    PRECAUTIONS: Back  WEIGHT BEARING RESTRICTIONS No  FALLS:  Has patient fallen in last 6 months? Yes, Number of falls: 1 related to ice  LIVING ENVIRONMENT: Lives with: lives with their family  OCCUPATION: Part-time bus driver 2 days a week  PLOF: Independent  PATIENT GOALS Return to work without pain, tie shoes.   OBJECTIVE:   DIAGNOSTIC FINDINGS:  05/05/2021: Degenerative changes with spurring and osteophyte most prominent at L2-L3. Lower lumbar facet arthropathy  PATIENT SURVEYS:  05/05/2021 FOTO 50 (Goal 70)  SCREENING FOR RED FLAGS: 05/05/2021 Bowel or bladder incontinence: No Spinal  tumors: No Cauda equina syndrome: No Compression fracture: No  COGNITION: 05/05/2021 Overall cognitive status: Within functional limits for tasks assessed      MUSCLE LENGTH: 05/05/2021 Hamstrings: Right 30 deg; Left 20 deg  POSTURE:  05/05/2021 Forward head, IR and protracted shoulders and decreased lumbar lordosis  LUMBAR ROM:   Active  A/PROM  04/25/2021 AROM 05/12/2021 AROM 06/03/2021  Flexion  30   Extension 0 5 75% WFL c mild ERP.  Repeated x 5 in standing: mild reduction in end range complaints.   Right lateral flexion  24   Left lateral flexion  20   Right rotation  Limited 75%   Left rotation  Limited 75%    (Blank rows = not tested)  LE ROM:  Active  Right 04/25/2021 Left 04/25/2021  Hip flexion 75 75  Hip extension    Hip abduction    Hip adduction    Hip internal rotation    Hip external rotation    Knee flexion    Knee extension    Ankle dorsiflexion    Ankle plantarflexion    Ankle inversion    Ankle eversion     (Blank rows = not tested)  LE MMT:  MMT Right 05/12/2021 Left 05/12/2021  Hip flexion 4/5 4-/5  Hip extension    Hip abduction 4/5 4-/5  Hip adduction 4/5 4-/5  Hip internal rotation    Hip external rotation    Knee flexion 5/5 5/5  Knee extension 5/5 5/5   (Blank rows = not tested)   TODAY'S TREATMENT  06/03/2021: Therex: Supine trunk rotation x 3 each side 20 seconds Standing trunk extension x 10  Bridges 2 x 10 2 sec holds Piriformis stretch pushing knee away in figure 4 x 3 each LE 20 seconds Figure 4 in supine pulling towards chest 20 sec x 3 bilateral Recumbent bike: x 7 minutes L3 seat at 11  05/12/2021: Supine trunk rotation x 3 each side 20 seconds Supine hamstring stretches x 2 each LE 30 seconds Standing trunk extension x 10 holding 5 seconds elbows against wall Bridges 2 x 10 unable to hold more than 3 seconds Hip flexor stretch x 3 holding 30 seconds Piriformis stretch pushing knee away in figure 4 x 2 each LE 20  seconds Recumbent bike: x 5 minutes L4 seat at 10   04/25/2021:  Functional Activities: Reviewed exam findings, postural education using spine model and reviewed imaging.  Encouraged log roll and lumbar roll use.  Encouraged walking 3X/week for 20 minutes and to avoid slouching and flexed postures.  Therapeutic exercises:   Lumbar extension AROM 10X 3 seconds  Heel to toe raises with transversus abdominus activation 10X 3 seconds  Single knee to chest stretch (other leg straight) supine 4X 20 seconds B  Supine hamstrings stretch (90/90) 4X 20 seconds B   PATIENT EDUCATION:  Education details: See above Person educated: Patient Education method: Explanation, Demonstration, Tactile cues, Verbal cues, and Handouts Education comprehension: verbalized understanding, returned demonstration, verbal cues required, tactile cues required, and needs further education   HOME EXERCISE PROGRAM: Access Code: [email or web address] URL: [email or web address] Date: 04/25/2021 Prepared by: Vista Mink  Exercises Single Knee to Chest Stretch - 2-3 x daily - 7 x weekly - 1 sets - 5 reps - 20 seconds hold Supine Hamstring Stretch - 2-3 x daily - 7 x weekly - 1 sets - 5 reps - 20 seconds hold Standing Lumbar Extension at Wall - Forearms - 5 x daily - 7 x weekly - 1 sets - 5 reps - 3 seconds hold Heel Toe Raises with Counter Support - 2-3 x daily - 7 x weekly - 1 sets - 10 reps - 3 seconds hold   ASSESSMENT:  CLINICAL IMPRESSION: Pt presented c continued mild to moderate pain complaints (improved compared to previous) with lumbar and hip mobility restrictions that may continue to benefit from skilled PT services to gain improvements.    OBJECTIVE IMPAIRMENTS decreased activity tolerance, decreased knowledge of condition, decreased ROM, decreased strength, decreased safety awareness, impaired perceived functional ability, increased muscle spasms, impaired flexibility, improper body mechanics,  postural dysfunction, and pain.   ACTIVITY LIMITATIONS community activity and driving.   PERSONAL FACTORS HTN, neuropathy, history of colon CA, former smoker are also affecting patient's functional outcome.    REHAB POTENTIAL: Good  CLINICAL DECISION MAKING: Stable/uncomplicated  EVALUATION COMPLEXITY: Low   GOALS: Goals reviewed with patient? Yes  SHORT TERM GOALS: Target date: 07/29/2021  Improve trunk extension AROM to 10 degrees. Baseline: 0 degrees Goal status: On-going 05/12/2021  2.  Saurabh will be independent with his day 1 HEP at [place]. Baseline: Started 04/25/2021 Goal status: On-going 05/12/2021  LONG TERM GOALS: Target date: 07/29/2021  Improve FOTO to 70. Baseline: 50 Goal status: INITIAL  2.  Improve low back pain to consistently 0-2/10 on the Numeric Pain Rating Scale. Baseline: Can be 6/10 Goal status: INITIAL  3.  Improve B LE flexibility for hip flexors to 100; hamstrings to 45 degrees. Baseline: See objective Goal status: INITIAL  4.  Improve spine strength as assessed by posture, spine strength testing and FOTO scores. Baseline: Flexed posture and 50 FOTO score Goal status: INITIAL  5.  Japheth will be independent with his long-term HEP at [place]. Baseline: Started 04/25/2021 Goal status: INITIAL  PLAN: PT FREQUENCY: 1-2x/week  PT DURATION: 8 weeks  PLANNED INTERVENTIONS: Therapeutic exercises, Therapeutic activity, Gait training, Patient/Family education, Joint mobilization, Dry Needling, Cryotherapy, Moist heat, Traction, and Manual therapy.  PLAN FOR NEXT SESSION:  Review exercise program, recheck FOTO   Scot Jun, PT, DPT, OCS, ATC 06/03/21  10:10 AM  Farley Ly PT, MPT ([facility] note only)

## 2021-06-18 ENCOUNTER — Encounter: Payer: Medicare Other | Admitting: Rehabilitative and Restorative Service Providers"

## 2021-07-03 ENCOUNTER — Other Ambulatory Visit: Payer: Self-pay | Admitting: Nurse Practitioner

## 2021-07-03 ENCOUNTER — Other Ambulatory Visit: Payer: Self-pay

## 2021-07-03 DIAGNOSIS — I10 Essential (primary) hypertension: Secondary | ICD-10-CM

## 2021-07-03 MED ORDER — METOPROLOL SUCCINATE ER 50 MG PO TB24: ORAL_TABLET | ORAL | 0 refills | Status: DC

## 2021-07-29 ENCOUNTER — Other Ambulatory Visit: Payer: Self-pay | Admitting: Nurse Practitioner

## 2021-07-29 DIAGNOSIS — N529 Male erectile dysfunction, unspecified: Secondary | ICD-10-CM

## 2021-08-02 ENCOUNTER — Other Ambulatory Visit: Payer: Self-pay | Admitting: Nurse Practitioner

## 2021-08-02 DIAGNOSIS — E782 Mixed hyperlipidemia: Secondary | ICD-10-CM

## 2021-08-13 ENCOUNTER — Other Ambulatory Visit: Payer: Self-pay | Admitting: Nurse Practitioner

## 2021-08-13 DIAGNOSIS — N529 Male erectile dysfunction, unspecified: Secondary | ICD-10-CM

## 2021-08-29 ENCOUNTER — Other Ambulatory Visit: Payer: Self-pay | Admitting: Nurse Practitioner

## 2021-08-29 DIAGNOSIS — N529 Male erectile dysfunction, unspecified: Secondary | ICD-10-CM

## 2021-09-17 ENCOUNTER — Ambulatory Visit (INDEPENDENT_AMBULATORY_CARE_PROVIDER_SITE_OTHER): Payer: Medicare Other | Admitting: Nurse Practitioner

## 2021-09-17 ENCOUNTER — Encounter: Payer: Self-pay | Admitting: Nurse Practitioner

## 2021-09-17 ENCOUNTER — Ambulatory Visit (INDEPENDENT_AMBULATORY_CARE_PROVIDER_SITE_OTHER): Payer: Medicare Other

## 2021-09-17 VITALS — BP 122/62 | HR 95 | Temp 98.0°F | Ht 76.4 in | Wt 206.0 lb

## 2021-09-17 DIAGNOSIS — N529 Male erectile dysfunction, unspecified: Secondary | ICD-10-CM

## 2021-09-17 DIAGNOSIS — I129 Hypertensive chronic kidney disease with stage 1 through stage 4 chronic kidney disease, or unspecified chronic kidney disease: Secondary | ICD-10-CM

## 2021-09-17 DIAGNOSIS — Z0000 Encounter for general adult medical examination without abnormal findings: Secondary | ICD-10-CM

## 2021-09-17 DIAGNOSIS — N183 Chronic kidney disease, stage 3 unspecified: Secondary | ICD-10-CM

## 2021-09-17 DIAGNOSIS — Z85038 Personal history of other malignant neoplasm of large intestine: Secondary | ICD-10-CM | POA: Diagnosis not present

## 2021-09-17 DIAGNOSIS — E782 Mixed hyperlipidemia: Secondary | ICD-10-CM | POA: Diagnosis not present

## 2021-09-17 LAB — LIPID PANEL
Chol/HDL Ratio: 4.2 ratio (ref 0.0–5.0)
Cholesterol, Total: 125 mg/dL (ref 100–199)
HDL: 30 mg/dL — ABNORMAL LOW (ref >39)
LDL Chol Calc (NIH): 64 mg/dL (ref 0–99)
Triglycerides: 182 mg/dL — ABNORMAL HIGH (ref 0–149)
VLDL Cholesterol Cal: 31 mg/dL (ref 5–40)

## 2021-09-17 LAB — BMP8+EGFR
BUN/Creatinine Ratio: 12 (ref 10–24)
BUN: 16 mg/dL (ref 8–27)
CO2: 22 mmol/L (ref 20–29)
Calcium: 9.5 mg/dL (ref 8.6–10.2)
Chloride: 100 mmol/L (ref 96–106)
Creatinine, Ser: 1.33 mg/dL — ABNORMAL HIGH (ref 0.76–1.27)
Glucose: 86 mg/dL (ref 70–99)
Potassium: 4.4 mmol/L (ref 3.5–5.2)
Sodium: 137 mmol/L (ref 134–144)
eGFR: 55 mL/min/1.73 — ABNORMAL LOW

## 2021-09-17 MED ORDER — METOPROLOL SUCCINATE ER 25 MG PO TB24: 25.0000 mg | ORAL_TABLET | Freq: Every day | ORAL | 1 refills | Status: DC

## 2021-09-17 MED ORDER — METOPROLOL SUCCINATE ER 50 MG PO TB24
ORAL_TABLET | ORAL | 1 refills | Status: DC
Start: 2021-09-17 — End: 2022-01-13

## 2021-09-17 MED ORDER — SILDENAFIL CITRATE 20 MG PO TABS
20.0000 mg | ORAL_TABLET | Freq: Every day | ORAL | 6 refills | Status: DC
Start: 2021-09-17 — End: 2021-09-17

## 2021-09-17 MED ORDER — SILDENAFIL CITRATE 50 MG PO TABS: 50.0000 mg | ORAL_TABLET | Freq: Every day | ORAL | 5 refills | Status: DC | PRN

## 2021-09-17 NOTE — Progress Notes (Signed)
I,Tianna Badgett,acting as a scribe for  , FNP.,have documented all relevant documentation on the behalf of  , FNP,as directed by   , FNP while in the presence of  , FNP.  Subjective:     Patient ID: Patrick Woodward , male    DOB: 09/12/1940 , 81 y.o.   MRN: [id number]   Chief Complaint  Patient presents with   Hypertension    HPI  Pt here today for hypertension f/u he has been compliant with all medications.  He is taking 1/2 tabs of the metoprolol for a few weeks. Blood pressure was up to 145 systolic with a slight headache.     Hypertension This is a chronic problem. The current episode started more than 1 year ago. The problem has been gradually worsening since onset. The problem is uncontrolled. Pertinent negatives include no anxiety, chest pain, headaches or palpitations. There are no associated agents to hypertension. Risk factors for coronary artery disease include male gender and sedentary lifestyle. Past treatments include beta blockers. The current treatment provides no improvement. Compliance problems include diet (eats increased amounts of salt).  There is no history of angina or kidney disease. There is no history of chronic renal disease.     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 With the 50 mg dose (total 75 mg), Disp: 90 tablet, Rfl: 1   sildenafil (VIAGRA) 50 MG tablet, Take 1 tablet (50 mg total) by mouth daily as needed for erectile dysfunction., Disp: 30 tablet,  Rfl: 5   aspirin 81 MG tablet, Take 1 tablet (81 mg total) by mouth daily., Disp: 100 tablet, Rfl: 12   atorvastatin (LIPITOR) 20 MG tablet, Take 1 tablet by mouth once daily, Disp: 90 tablet, Rfl: 0   cholecalciferol (VITAMIN D) 1000 UNITS tablet, Take 2,000 Units by mouth daily., Disp: , Rfl:    methylPREDNISolone (MEDROL) 4 MG tablet, Take as directed (Patient not taking: Reported on 09/17/2021), Disp: 21 tablet, Rfl: 0   metoprolol succinate (TOPROL-XL) 50 MG 24 hr tablet, TAKE 1 TABLET BY MOUTH ONCE DAILY IN AM WITH 25 MG TABLET (TOTAL 75 MG), Disp: 90 tablet, Rfl: 1   Multiple Vitamin (MULTIVITAMIN PO), Take 1 tablet by mouth daily., Disp: , Rfl:    traMADol (ULTRAM) 50 MG tablet, Take 1 tablet (50 mg total) by mouth every 6 (six) hours as needed. (Patient not taking: Reported on 09/17/2021), Disp: 20 tablet, Rfl: 0   vitamin E 180 MG (400 UNITS) capsule, Take 400 Units by mouth daily., Disp: , Rfl:    Allergies  Allergen Reactions   Procardia [Nifedipine] Other (See Comments)    irratic heart rate      Review of Systems  Constitutional: Negative.   Respiratory: Negative.    Cardiovascular: Negative.  Negative for chest pain and palpitations.  Gastrointestinal: Negative.   Neurological: Negative.  Negative for headaches.       Today's Vitals   09/17/21 0905  BP: 122/62  Pulse: 95  Temp: 98 F (36.7 C)  TempSrc: Oral  Weight: 206 lb (93.4 kg)  Height: 6' 4.4" (1.941 m)   Body mass index is 24.81 kg/m.  Wt Readings from Last 3 Encounters:  09/17/21 206 lb (93.4 kg)  09/17/21 206 lb (93.4 kg)  03/25/21 208 lb (94.3 kg)    Objective:  Physical Exam      Assessment And Plan:     1. Benign hypertension with chronic kidney disease, stage III ([place]) Comments: Blood pressure is well controlled, advised to not take additional medication without consulting with provider. Will add a 25 mg dose metoprolol. - BMP8+EGFR - metoprolol succinate (TOPROL-XL) 50 MG 24 hr tablet; TAKE 1  TABLET BY MOUTH ONCE DAILY IN AM WITH 25 MG TABLET (TOTAL 75 MG)  Dispense: 90 tablet; Refill: 1 - metoprolol succinate (TOPROL XL) 25 MG 24 hr tablet; Take 1 tablet (25 mg total) by mouth daily. With the 50 mg dose (total 75 mg)  Dispense: 90 tablet; Refill: 1  2. Mixed hyperlipidemia Comments: Stable, continue current medications.  - Lipid panel  3. Erectile dysfunction, unspecified erectile dysfunction type Comments: Current dose 20 mg has not been effective, has been taking 3 at a time, will increase dose to 50 mg he is advised to not take more than 100 mg at a time. - sildenafil (VIAGRA) 50 MG tablet; Take 1 tablet (50 mg total) by mouth daily as needed for erectile dysfunction.  Dispense: 30 tablet; Refill: 5  4. History of colon cancer     Patient was given opportunity to ask questions. Patient verbalized understanding of the plan and was able to repeat key elements of the plan. All questions were answered to their satisfaction.  [name], FNP   I, [name], FNP, have reviewed all documentation for this visit. The documentation on 09/17/21 for the exam, diagnosis, procedures, and orders are all accurate and complete.   IF YOU HAVE BEEN REFERRED TO A SPECIALIST, IT MAY TAKE 1-2 WEEKS TO SCHEDULE/PROCESS THE REFERRAL. IF YOU HAVE NOT HEARD FROM US/SPECIALIST IN TWO WEEKS, PLEASE GIVE [place] A CALL AT [phone].   THE PATIENT IS ENCOURAGED TO PRACTICE SOCIAL DISTANCING DUE TO THE COVID-19 PANDEMIC.

## 2021-09-17 NOTE — Patient Instructions (Signed)
Patrick Woodward , Thank you for taking time to come for your Medicare Wellness Visit. I appreciate your ongoing commitment to your health goals. Please review the following plan we discussed and let me know if I can assist you in the future.   Screening recommendations/referrals: Colonoscopy: not required Recommended yearly ophthalmology/optometry visit for glaucoma screening and checkup Recommended yearly dental visit for hygiene and checkup  Vaccinations: Influenza vaccine: due Pneumococcal vaccine: completed 11/04/2017 Tdap vaccine: completed 01/07/2021, due 01/08/2031 Shingles vaccine: completed   Covid-19:  11/19/2020, 12/06/2019, 04/22/2019, 03/24/2019  Advanced directives: Advance directive discussed with you today. Even though you declined this today please call our office should you change your mind and we can give you the proper paperwork for you to fill out.  Conditions/risks identified: none  Next appointment: Follow up in one year for your annual wellness visit.   Preventive Care 75 Years and Older, Male Preventive care refers to lifestyle choices and visits with your health care provider that can promote health and wellness. What does preventive care include? A yearly physical exam. This is also called an annual well check. Dental exams once or twice a year. Routine eye exams. Ask your health care provider how often you should have your eyes checked. Personal lifestyle choices, including: Daily care of your teeth and gums. Regular physical activity. Eating a healthy diet. Avoiding tobacco and drug use. Limiting alcohol use. Practicing safe sex. Taking low doses of aspirin every day. Taking vitamin and mineral supplements as recommended by your health care provider. What happens during an annual well check? The services and screenings done by your health care provider during your annual well check will depend on your age, overall health, lifestyle risk factors, and  family history of disease. Counseling  Your health care provider may ask you questions about your: Alcohol use. Tobacco use. Drug use. Emotional well-being. Home and relationship well-being. Sexual activity. Eating habits. History of falls. Memory and ability to understand (cognition). Work and work Statistician. Screening  You may have the following tests or measurements: Height, weight, and BMI. Blood pressure. Lipid and cholesterol levels. These may be checked every 5 years, or more frequently if you are over 37 years old. Skin check. Lung cancer screening. You may have this screening every year starting at age 78 if you have a 30-pack-year history of smoking and currently smoke or have quit within the past 15 years. Fecal occult blood test (FOBT) of the stool. You may have this test every year starting at age 68. Flexible sigmoidoscopy or colonoscopy. You may have a sigmoidoscopy every 5 years or a colonoscopy every 10 years starting at age 30. Prostate cancer screening. Recommendations will vary depending on your family history and other risks. Hepatitis C blood test. Hepatitis B blood test. Sexually transmitted disease (STD) testing. Diabetes screening. This is done by checking your blood sugar (glucose) after you have not eaten for a while (fasting). You may have this done every 1-3 years. Abdominal aortic aneurysm (AAA) screening. You may need this if you are a current or former smoker. Osteoporosis. You may be screened starting at age 68 if you are at high risk. Talk with your health care provider about your test results, treatment options, and if necessary, the need for more tests. Vaccines  Your health care provider may recommend certain vaccines, such as: Influenza vaccine. This is recommended every year. Tetanus, diphtheria, and acellular pertussis (Tdap, Td) vaccine. You may need a Td booster every 10 years. Zoster vaccine.  You may need this after age 50. Pneumococcal  13-valent conjugate (PCV13) vaccine. One dose is recommended after age 55. Pneumococcal polysaccharide (PPSV23) vaccine. One dose is recommended after age 44. Talk to your health care provider about which screenings and vaccines you need and how often you need them. This information is not intended to replace advice given to you by your health care provider. Make sure you discuss any questions you have with your health care provider. Document Released: 02/22/2015 Document Revised: 10/16/2015 Document Reviewed: 11/27/2014 Elsevier Interactive Patient Education  2017 Patrick Woodward Prevention in the Home Falls can cause injuries. They can happen to people of all ages. There are many things you can do to make your home safe and to help prevent falls. What can I do on the outside of my home? Regularly fix the edges of walkways and driveways and fix any cracks. Remove anything that might make you trip as you walk through a door, such as a raised step or threshold. Trim any bushes or trees on the path to your home. Use bright outdoor lighting. Clear any walking paths of anything that might make someone trip, such as rocks or tools. Regularly check to see if handrails are loose or broken. Make sure that both sides of any steps have handrails. Any raised decks and porches should have guardrails on the edges. Have any leaves, snow, or ice cleared regularly. Use sand or salt on walking paths during winter. Clean up any spills in your garage right away. This includes oil or grease spills. What can I do in the bathroom? Use night lights. Install grab bars by the toilet and in the tub and shower. Do not use towel bars as grab bars. Use non-skid mats or decals in the tub or shower. If you need to sit down in the shower, use a plastic, non-slip stool. Keep the floor dry. Clean up any water that spills on the floor as soon as it happens. Remove soap buildup in the tub or shower regularly. Attach  bath mats securely with double-sided non-slip rug tape. Do not have throw rugs and other things on the floor that can make you trip. What can I do in the bedroom? Use night lights. Make sure that you have a light by your bed that is easy to reach. Do not use any sheets or blankets that are too big for your bed. They should not hang down onto the floor. Have a firm chair that has side arms. You can use this for support while you get dressed. Do not have throw rugs and other things on the floor that can make you trip. What can I do in the kitchen? Clean up any spills right away. Avoid walking on wet floors. Keep items that you use a lot in easy-to-reach places. If you need to reach something above you, use a strong step stool that has a grab bar. Keep electrical cords out of the way. Do not use floor polish or wax that makes floors slippery. If you must use wax, use non-skid floor wax. Do not have throw rugs and other things on the floor that can make you trip. What can I do with my stairs? Do not leave any items on the stairs. Make sure that there are handrails on both sides of the stairs and use them. Fix handrails that are broken or loose. Make sure that handrails are as long as the stairways. Check any carpeting to make sure that it is  firmly attached to the stairs. Fix any carpet that is loose or worn. Avoid having throw rugs at the top or bottom of the stairs. If you do have throw rugs, attach them to the floor with carpet tape. Make sure that you have a light switch at the top of the stairs and the bottom of the stairs. If you do not have them, ask someone to add them for you. What else can I do to help prevent falls? Wear shoes that: Do not have high heels. Have rubber bottoms. Are comfortable and fit you well. Are closed at the toe. Do not wear sandals. If you use a stepladder: Make sure that it is fully opened. Do not climb a closed stepladder. Make sure that both sides of the  stepladder are locked into place. Ask someone to hold it for you, if possible. Clearly mark and make sure that you can see: Any grab bars or handrails. First and last steps. Where the edge of each step is. Use tools that help you move around (mobility aids) if they are needed. These include: Canes. Walkers. Scooters. Crutches. Turn on the lights when you go into a dark area. Replace any light bulbs as soon as they burn out. Set up your furniture so you have a clear path. Avoid moving your furniture around. If any of your floors are uneven, fix them. If there are any pets around you, be aware of where they are. Review your medicines with your doctor. Some medicines can make you feel dizzy. This can increase your chance of falling. Ask your doctor what other things that you can do to help prevent falls. This information is not intended to replace advice given to you by your health care provider. Make sure you discuss any questions you have with your health care provider. Document Released: 11/22/2008 Document Revised: 07/04/2015 Document Reviewed: 03/02/2014 Elsevier Interactive Patient Education  2017 [employer].

## 2021-09-17 NOTE — Progress Notes (Signed)
Subjective:   Patrick Woodward is a 81 y.o. male who presents for Medicare Annual/Subsequent preventive examination.  Review of Systems     Cardiac Risk Factors include: advanced age (>20mn, >>76women);dyslipidemia;hypertension;male gender     Objective:    Today's Vitals   09/17/21 0850  BP: 122/62  Pulse: 95  Temp: 98 F (36.7 C)  TempSrc: Oral  SpO2: 94%  Weight: 206 lb (93.4 kg)  Height: 6' 4.4" (1.941 m)   Body mass index is 24.81 kg/m.     09/17/2021    9:00 AM 04/25/2021   10:25 AM 08/14/2020    9:28 AM 08/09/2019    9:08 AM 08/02/2018    8:54 AM 06/07/2017    9:35 AM 06/02/2016   12:14 PM  Advanced Directives  Does Patient Have a Medical Advance Directive? No No No No No No No  Would patient like information on creating a medical advance directive? No - Patient declined No - Patient declined No - Patient declined  No - Patient declined      Current Medications (verified) Outpatient Encounter Medications as of 09/17/2021  Medication Sig   aspirin 81 MG tablet Take 1 tablet (81 mg total) by mouth daily.   atorvastatin (LIPITOR) 20 MG tablet Take 1 tablet by mouth once daily   cholecalciferol (VITAMIN D) 1000 UNITS tablet Take 2,000 Units by mouth daily.   metoprolol succinate (TOPROL-XL) 50 MG 24 hr tablet TAKE 1 TABLET BY MOUTH ONCE DAILY TAKE  WITH  OR  IMMEDIATELY  FOLLOWING  A  MEAL (Patient taking differently: Patient is taking 1.5 tablets daily)   Multiple Vitamin (MULTIVITAMIN PO) Take 1 tablet by mouth daily.   sildenafil (REVATIO) 20 MG tablet Take 1 tablet by mouth once daily   vitamin E 180 MG (400 UNITS) capsule Take 400 Units by mouth daily.   methylPREDNISolone (MEDROL) 4 MG tablet Take as directed (Patient not taking: Reported on 09/17/2021)   traMADol (ULTRAM) 50 MG tablet Take 1 tablet (50 mg total) by mouth every 6 (six) hours as needed. (Patient not taking: Reported on 09/17/2021)   No facility-administered encounter medications on file as of  09/17/2021.    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   Past Surgical History:  Procedure Laterality Date   COLON SURGERY  10/20/10   colon resection w/colostomy/Hartmann pouch procedure   COLONOSCOPY     COLONOSCOPY N/A 08/28/2013   Procedure: COLONOSCOPY;  Surgeon: VLear Ng MD;  [facility]: [facility];  Service: Endoscopy;  Laterality: N/A;   COLOSTOMY CLOSURE  12/02/2011   COLOSTOMY CLOSURE  12/02/2011   Procedure: COLOSTOMY CLOSURE;  Surgeon: TJoyice Faster Cornett, MD;  Location[facility]  Service: General;  Laterality: N/A;   HOT HEMOSTASIS N/A 08/28/2013   Procedure: HOT HEMOSTASIS (ARGON PLASMA COAGULATION/BICAP);  Surgeon: VLear Ng MD;  [facility];  Service: Endoscopy;  Laterality: N/A;   PORT-A-CATH REMOVAL  12/02/2011   PORT-A-CATH REMOVAL  12/02/2011   Procedure: REMOVAL PORT-A-CATH;  Surgeon: TJoyice Faster Cornett, MD;  [facility]  Service: General;  Laterality: Left;   PORTACATH PLACEMENT  11/27/10   Dr. BJudeen Hammanschest   Family History  Problem Relation Age of Onset  Cancer Mother        colon   Cancer Father        prostate   Social History   Socioeconomic History   Marital status: Divorced    Spouse name: Not on file   Number of children: Not on file   Years of education: Not on file   Highest education level: Not on file  Occupational History   Occupation: retired  Tobacco Use   Smoking status: Former    Packs/day: 1.00    Years: 20.00    Total pack years: 20.00    Types: Cigarettes, Pipe, Cigars   Smokeless tobacco: Never   Tobacco comments:    12/02/2011 "stopped smoking ~ 30 yr ago"  Vaping Use   Vaping Use: Never used  Substance and Sexual Activity    Alcohol use: No   Drug use: No   Sexual activity: Yes  Other Topics Concern   Not on file  Social History Narrative   Not on file   Social Determinants of Health   Financial Resource Strain: Low Risk  (09/17/2021)   Overall Financial Resource Strain (CARDIA)    Difficulty of Paying Living Expenses: Not hard at all  Food Insecurity: No Food Insecurity (09/17/2021)   Hunger Vital Sign    Worried About Running Out of Food in the Last Year: Never true    [place] in the Last Year: Never true  Transportation Needs: No Transportation Needs (09/17/2021)   PRAPARE - Hydrologist (Medical): No    Lack of Transportation (Non-Medical): No  Physical Activity: Inactive (09/17/2021)   Exercise Vital Sign    Days of Exercise per Week: 0 days    Minutes of Exercise per Session: 0 min  Stress: No Stress Concern Present (09/17/2021)   [place]    Feeling of Stress : Not at all  Social Connections: Not on file    Tobacco Counseling Counseling given: Not Answered Tobacco comments: 12/02/2011 "stopped smoking ~ 30 yr ago"   Clinical Intake:  Pre-visit preparation completed: Yes  Pain : No/denies pain     Nutritional Status: BMI of 19-24  Normal Nutritional Risks: None Diabetes: No  How often do you need to have someone help you when you read instructions, pamphlets, or other written materials from your doctor or pharmacy?: 1 - Never What is the last grade level you completed in school?: 10th grade  Diabetic? no  Interpreter Needed?: No  Information entered by :: NAllen LPN   Activities of Daily Living    09/17/2021    9:02 AM  In your present state of health, do you have any difficulty performing the following activities:  Hearing? 0  Vision? 0  Comment trouble with small print  Difficulty concentrating or making decisions? 0  Walking or climbing stairs? 0  Dressing or bathing? 0   Doing errands, shopping? 0  Preparing Food and eating ? N  Using the Toilet? N  In the past six months, have you accidently leaked urine? N  Do you have problems with loss of bowel control? N  Managing your Medications? N  Managing your Finances? N  Housekeeping or managing your Housekeeping? N    Patient Care Team: [name], FNP as PCP - General (General Practice) Warden Fillers, MD as Consulting Physician (Ophthalmology) Ladell Pier, MD as Consulting Physician (Oncology) Donato Heinz, MD as Consulting Physician (Nephrology)  Indicate any  recent Medical Services you may have received from other than [facility] providers in the past year (date may be approximate).     Assessment:   This is a routine wellness examination for Harith.  Hearing/Vision screen Vision Screening - Comments:: No regular eye exams, Dr. Katy Fitch  Dietary issues and exercise activities discussed: Current Exercise Habits: The patient has a physically strenuous job, but has no regular exercise apart from work.   Goals Addressed             This Visit's Progress    Patient Stated       09/17/2021, stay healthy       Depression Screen    09/17/2021    9:02 AM 08/14/2020    9:29 AM 08/14/2020    9:13 AM 08/09/2019    9:09 AM 11/17/2018    9:43 AM 08/02/2018    8:56 AM  PHQ 2/9 Scores  PHQ - 2 Score 0 0 0 0 0 0  PHQ- 9 Score    0  0    Fall Risk    09/17/2021    9:01 AM 08/14/2020    9:29 AM 08/14/2020    9:13 AM 08/09/2019    9:09 AM 11/17/2018    9:43 AM  Fall Risk   Falls in the past year? 0 0 0 0 0  Number falls in past yr: 0  0    Injury with Fall? 0  0    Risk for fall due to : Medication side effect Medication side effect No Fall Risks Medication side effect   Follow up Falls evaluation completed;Education provided;Falls prevention discussed Falls evaluation completed;Education provided;Falls prevention discussed  Falls evaluation completed;Education provided;Falls prevention  discussed     FALL RISK PREVENTION PERTAINING TO THE HOME:  Any stairs in or around the home? Yes  If so, are there any without handrails? No  Home free of loose throw rugs in walkways, pet beds, electrical cords, etc? Yes  Adequate lighting in your home to reduce risk of falls? Yes   ASSISTIVE DEVICES UTILIZED TO PREVENT FALLS:  Life alert? No  Use of a cane, walker or w/c? No  Grab bars in the bathroom? No  Shower chair or bench in shower? Yes  Elevated toilet seat or a handicapped toilet? Yes   TIMED UP AND GO:  Was the test performed? No .    Gait steady and fast without use of assistive device  Cognitive Function:        09/17/2021    9:04 AM 08/14/2020    9:30 AM 08/09/2019    9:11 AM 08/02/2018    8:59 AM  6CIT Screen  What Year? 0 points 0 points 0 points 0 points  What month? 0 points 0 points 0 points 0 points  What time? 0 points 0 points 0 points 0 points  Count back from 20 0 points 0 points 0 points 0 points  Months in reverse 2 points 2 points 4 points 0 points  Repeat phrase 6 points 4 points 2 points 0 points  Total Score 8 points 6 points 6 points 0 points    Immunizations Immunization History  Administered Date(s) Administered   Fluad Quad(high Dose 65+) 10/26/2018   Influenza Split 02/23/2011   Influenza, High Dose Seasonal PF 10/30/2016   Influenza-Unspecified 10/26/2011, 10/11/2013, 11/10/2019   Moderna Covid-19 Vaccine Bivalent Booster 74yr & up 11/19/2020   Moderna Sars-Covid-2 Vaccination 03/24/2019, 04/22/2019, 12/06/2019   Pneumococcal Conjugate-13 11/04/2017   Pneumococcal  Polysaccharide-23 12/03/2011   Tdap 01/07/2021   Zoster Recombinat (Shingrix) 02/12/2021, 06/03/2021    TDAP status: Up to date  Flu Vaccine status: Due, Education has been provided regarding the importance of this vaccine. Advised may receive this vaccine at local pharmacy or Health Dept. Aware to provide a copy of the vaccination record if obtained from local  pharmacy or Health Dept. Verbalized acceptance and understanding.  Pneumococcal vaccine status: Up to date  Covid-19 vaccine status: Completed vaccines  Qualifies for Shingles Vaccine? Yes   Zostavax completed No   Shingrix Completed?: Yes  Screening Tests Health Maintenance  Topic Date Due   INFLUENZA VACCINE  09/09/2021   TETANUS/TDAP  01/08/2031   Pneumonia Vaccine 54+ Years old  Completed   COVID-19 Vaccine  Completed   Hepatitis C Screening  Completed   Zoster Vaccines- Shingrix  Completed   HPV VACCINES  Aged Out   COLONOSCOPY (Pts 45-49yr Insurance coverage will need to be confirmed)  Discontinued    Health Maintenance  Health Maintenance Due  Topic Date Due   INFLUENZA VACCINE  09/09/2021    Colorectal cancer screening: No longer required.   Lung Cancer Screening: (Low Dose CT Chest recommended if Age 79-80years, 30 pack-year currently smoking OR have quit w/in 15years.) does not qualify.   Lung Cancer Screening Referral: no  Additional Screening:  Hepatitis C Screening: does qualify; Completed 08/09/2019  Vision Screening: Recommended annual ophthalmology exams for early detection of glaucoma and other disorders of the eye. Is the patient up to date with their annual eye exam?  No  Who is the provider or what is the name of the office in which the patient attends annual eye exams? Dr. GKaty FitchIf pt is not established with a provider, would they like to be referred to a provider to establish care? No .   Dental Screening: Recommended annual dental exams for proper oral hygiene  Community Resource Referral / Chronic Care Management: CRR required this visit?  No   CCM required this visit?  No      Plan:     I have personally reviewed and noted the following in the patient's chart:   Medical and social history Use of alcohol, tobacco or illicit drugs  Current medications and supplements including opioid prescriptions. Patient is not currently taking  opioid prescriptions. Functional ability and status Nutritional status Physical activity Advanced directives List of other physicians Hospitalizations, surgeries, and ER visits in previous 12 months Vitals Screenings to include cognitive, depression, and falls Referrals and appointments  In addition, I have reviewed and discussed with patient certain preventive protocols, quality metrics, and best practice recommendations. A written personalized care plan for preventive services as well as general preventive health recommendations were provided to patient.     NKellie Simmering LPN   [id number]  Nurse Notes: none

## 2021-09-17 NOTE — Patient Instructions (Addendum)

## 2021-10-14 ENCOUNTER — Ambulatory Visit: Payer: Medicare Other

## 2021-10-27 ENCOUNTER — Other Ambulatory Visit: Payer: Self-pay | Admitting: Nurse Practitioner

## 2021-10-27 DIAGNOSIS — E782 Mixed hyperlipidemia: Secondary | ICD-10-CM

## 2021-11-19 ENCOUNTER — Inpatient Hospital Stay: Payer: Medicare Other | Admitting: Nurse Practitioner

## 2021-11-19 ENCOUNTER — Ambulatory Visit: Payer: Medicare Other | Admitting: Oncology

## 2021-11-19 ENCOUNTER — Inpatient Hospital Stay: Payer: Medicare Other

## 2022-01-05 ENCOUNTER — Telehealth: Payer: Self-pay | Admitting: *Deleted

## 2022-01-05 NOTE — Telephone Encounter (Signed)
Mr. Reier left VM that he needs Dr. Benay Spice to send employer records that his cancer is in remission. Called back and left VM that he needs to provide the form or have employer fax form to [phone] and that MD will only address his cancer and not his other health issues.

## 2022-01-09 ENCOUNTER — Encounter: Payer: Self-pay | Admitting: *Deleted

## 2022-01-09 NOTE — Progress Notes (Signed)
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 Faxed to [phone] as requested. Sent scheduling message to get him back in for lab/OV for 1 year f/u that was missed.

## 2022-01-12 ENCOUNTER — Telehealth: Payer: Self-pay | Admitting: Cardiovascular Disease

## 2022-01-12 NOTE — Progress Notes (Unsigned)
Office Visit    Patient Name: Patrick Woodward Date of Encounter: 01/12/2022  Primary Care Provider:  [name], [place] Primary Cardiologist:  None Primary Electrophysiologist: None  Chief Complaint    Patrick Woodward is a 81 y.o. 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 Cornett, MD;  Location: [place];  Service: General;  Laterality: N/A;   HOT HEMOSTASIS N/A 08/28/2013   Procedure: HOT HEMOSTASIS (ARGON PLASMA COAGULATION/BICAP);  Surgeon: Lear Ng, MD;  [facility];  Service: Endoscopy;  Laterality: N/A;   PORT-A-CATH REMOVAL  12/02/2011   PORT-A-CATH REMOVAL  12/02/2011   Procedure: REMOVAL PORT-A-CATH;  Surgeon: Joyice Faster. Cornett, MD;  Location: [place];  Service: General;  Laterality: Left;   PORTACATH PLACEMENT  11/27/10   Dr. 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  Patrick Woodward was first seen by Dr. Acie Fredrickson 12/2020 for evaluation of  hyperlipidemia.  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  GXT was performed 01/2021 showing hypertensive response to exercise and poor exercise capacity. He was advised to abstain from excess salt in his diet.  2D echo was also completed and revealed EF of 60-65%, no RWMA, mild LVH, grade 1 DD with no evidence of MVR or MS.  Patrick Woodward presents today for CDL clearance.  Since last being seen in the office patient reports that he has been doing well with no cardiac complaints.  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  His blood pressure today is well-controlled at 126/78 and heart rate is 90 bpm.  He reports compliance with all of his medications and denies any adverse reactions.  He is currently staying active with work and he is eating a heart healthy balanced diet.  He is euvolemic on examination patient denies chest pain, palpitations, dyspnea, PND, orthopnea, nausea, vomiting, dizziness, syncope, edema, weight gain, or early satiety.   Home Medications    Current Outpatient Medications  Medication Sig Dispense Refill   aspirin 81 MG tablet Take 1 tablet (81 mg total) by mouth daily. 100 tablet 12   atorvastatin (LIPITOR) 20 MG tablet Take 1 tablet by mouth once daily 90 tablet 0   cholecalciferol (VITAMIN D) 1000 UNITS tablet Take 2,000 Units by mouth daily.     methylPREDNISolone (MEDROL) 4 MG tablet  Take as directed (Patient not taking: Reported on 09/17/2021) 21 tablet 0   metoprolol succinate (TOPROL XL) 25 MG 24 hr tablet Take 1 tablet (25 mg total) by mouth daily. With the 50 mg dose (total 75 mg) 90 tablet 1   metoprolol succinate (TOPROL-XL) 50 MG 24 hr tablet TAKE 1 TABLET BY MOUTH ONCE DAILY IN AM WITH 25 MG TABLET (TOTAL 75 MG) 90 tablet 1   Multiple Vitamin (MULTIVITAMIN PO) Take 1 tablet by mouth daily.     sildenafil (VIAGRA) 50 MG tablet Take 1 tablet (50 mg total) by mouth daily as needed for  erectile dysfunction. 30 tablet 5   traMADol (ULTRAM) 50 MG tablet Take 1 tablet (50 mg total) by mouth every 6 (six) hours as needed. (Patient not taking: Reported on 09/17/2021) 20 tablet 0   vitamin E 180 MG (400 UNITS) capsule Take 400 Units by mouth daily.     No current facility-administered medications for this visit.     Review of Systems  Please see the history of present illness.     All other systems reviewed and are otherwise negative except as noted above.  Physical Exam    Wt Readings from Last 3 Encounters:  09/17/21 206 lb (93.4 kg)  09/17/21 206 lb (93.4 kg)  03/25/21 208 lb (94.3 kg)   TI:RWERX were no vitals filed for this visit.,There is no height or weight on file to calculate BMI.  Constitutional:      Appearance: Healthy appearance. Not in distress.  Neck:     Vascular: JVD normal.  Pulmonary:     Effort: Pulmonary effort is normal.     Breath sounds: No wheezing. No rales. Diminished in the bases Cardiovascular:     Normal rate. Regular rhythm. Normal S1. Normal S2.      Murmurs: There is no murmur.  Edema:    Peripheral edema absent.  Abdominal:     Palpations: Abdomen is soft non tender. There is no hepatomegaly.  Skin:    General: Skin is warm and dry.  Neurological:     General: No focal deficit present.     Mental Status: Alert and oriented to person, place and time.     Cranial Nerves: Cranial nerves are intact.  EKG/LABS/Other Studies Reviewed    ECG personally reviewed by me today -sinus rhythm with rate of 90 bpm and no acute changes compared to previous EKG.  Lab Results  Component Value Date   WBC 4.0 01/01/2021   HGB 15.8 01/01/2021   HCT 46.9 01/01/2021   MCV 85 01/01/2021   PLT 168 01/01/2021   Lab Results  Component Value Date   CREATININE 1.33 (H) 09/17/2021   BUN 16 09/17/2021   NA 137 09/17/2021   K 4.4 09/17/2021   CL 100 09/17/2021   CO2 22 09/17/2021   Lab Results  Component Value Date   ALT 28 01/01/2021    AST 27 01/01/2021   ALKPHOS 80 01/01/2021   BILITOT 0.8 01/01/2021   Lab Results  Component Value Date   CHOL 125 09/17/2021   HDL 30 (L) 09/17/2021   LDLCALC 64 09/17/2021   TRIG 182 (H) 09/17/2021   CHOLHDL 4.2 09/17/2021    No results found for: "HGBA1C"  Assessment & Plan    1.  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 -Previous 2D echo completed and revealed EF of 60-65%, no RWMA, mild LVH,  grade 1 DD with no evidence of MVR or MS. -Patient reports that he has not had any new cardiac complaints and is doing well since his previous visit.  2.  Hyperlipidemia: -Patient's last LDL cholesterol was 64 on 09/2021 Continue atorvastatin 20 mg daily  3.  Essential hypertension: -Patient's blood pressure today was well-controlled today at 124/78 -Continue Toprol-XL 75 mg daily  4.  History of colon cancer: -s/p currently in remission  Disposition: Follow-up with None or APP as needed Shared Decision Making/Informed Consent The risks [chest pain, shortness of breath, cardiac arrhythmias, dizziness, blood pressure fluctuations, myocardial infarction, stroke/transient ischemic attack, and life-threatening complications (estimated to be 1 in 10,000)], benefits (risk stratification, diagnosing coronary artery disease, treatment guidance) and alternatives of an exercise tolerance test were discussed in detail with Patrick Woodward and he agrees to proceed.   Medication Adjustments/Labs and Tests Ordered: Current medicines are reviewed at length with the patient today.  Concerns regarding medicines are outlined above.   Signed, Mable Fill, Marissa Nestle, NP 01/12/2022, 7:53 PM [facility]  Note:  This document was prepared using Dragon voice recognition software and may include unintentional dictation errors.

## 2022-01-12 NOTE — Telephone Encounter (Signed)
Patient states his employer is requesting that he have a repeat echocardiogram. He states that his employer has taken him out of work for the time being so he is hopeful to have the order placed as soon as possible.

## 2022-01-12 NOTE — Telephone Encounter (Signed)
Called and spoke with patient. He understands that he needs to be seen by cardiology and to bring all paperwork with him, that the ECHO (and other required testing) will be ordered at that visit. Pt scheduled to see Ambrose Pancoast, NP tomorrow.

## 2022-01-13 ENCOUNTER — Encounter: Payer: Self-pay | Admitting: Nurse Practitioner

## 2022-01-13 ENCOUNTER — Ambulatory Visit (INDEPENDENT_AMBULATORY_CARE_PROVIDER_SITE_OTHER): Payer: Medicare Other | Admitting: Nurse Practitioner

## 2022-01-13 ENCOUNTER — Telehealth: Payer: Self-pay

## 2022-01-13 ENCOUNTER — Ambulatory Visit: Payer: Medicare Other | Attending: Nurse Practitioner | Admitting: Nurse Practitioner

## 2022-01-13 VITALS — BP 136/80 | HR 74 | Temp 98.3°F | Ht 76.4 in | Wt 211.0 lb

## 2022-01-13 VITALS — BP 126/78 | HR 90 | Ht 77.0 in | Wt 208.0 lb

## 2022-01-13 DIAGNOSIS — Z85038 Personal history of other malignant neoplasm of large intestine: Secondary | ICD-10-CM

## 2022-01-13 DIAGNOSIS — N529 Male erectile dysfunction, unspecified: Secondary | ICD-10-CM

## 2022-01-13 DIAGNOSIS — I129 Hypertensive chronic kidney disease with stage 1 through stage 4 chronic kidney disease, or unspecified chronic kidney disease: Secondary | ICD-10-CM

## 2022-01-13 DIAGNOSIS — N183 Chronic kidney disease, stage 3 unspecified: Secondary | ICD-10-CM | POA: Diagnosis not present

## 2022-01-13 DIAGNOSIS — I10 Essential (primary) hypertension: Secondary | ICD-10-CM

## 2022-01-13 DIAGNOSIS — Z0000 Encounter for general adult medical examination without abnormal findings: Secondary | ICD-10-CM

## 2022-01-13 DIAGNOSIS — E782 Mixed hyperlipidemia: Secondary | ICD-10-CM

## 2022-01-13 DIAGNOSIS — Z79899 Other long term (current) drug therapy: Secondary | ICD-10-CM | POA: Diagnosis not present

## 2022-01-13 DIAGNOSIS — Z024 Encounter for examination for driving license: Secondary | ICD-10-CM | POA: Diagnosis not present

## 2022-01-13 LAB — POCT URINALYSIS DIPSTICK
Bilirubin, UA: NEGATIVE
Blood, UA: NEGATIVE
Glucose, UA: NEGATIVE
Ketones, UA: NEGATIVE
Leukocytes, UA: NEGATIVE
Nitrite, UA: NEGATIVE
Protein, UA: NEGATIVE
Spec Grav, UA: 1.015 (ref 1.010–1.025)
Urobilinogen, UA: 0.2 E.U./dL
pH, UA: 6 (ref 5.0–8.0)

## 2022-01-13 MED ORDER — ATORVASTATIN CALCIUM 20 MG PO TABS: 20.0000 mg | ORAL_TABLET | Freq: Every day | ORAL | 1 refills | Status: DC

## 2022-01-13 MED ORDER — SILDENAFIL CITRATE 50 MG PO TABS: 50.0000 mg | ORAL_TABLET | Freq: Every day | ORAL | 5 refills | Status: DC | PRN

## 2022-01-13 MED ORDER — METOPROLOL SUCCINATE ER 50 MG PO TB24: ORAL_TABLET | ORAL | 1 refills | Status: DC

## 2022-01-13 MED ORDER — METOPROLOL SUCCINATE ER 25 MG PO TB24: 25.0000 mg | ORAL_TABLET | Freq: Every day | ORAL | 1 refills | Status: DC

## 2022-01-13 NOTE — Patient Instructions (Signed)
Medication Instructions:  Your physician recommends that you continue on your current medications as directed. Please refer to the Current Medication list given to you today. *If you need a refill on your cardiac medications before your next appointment, please call your pharmacy*   Lab Work: None ordered If you have labs (blood work) drawn today and your tests are completely normal, you will receive your results only by: [place] (if you have MyChart) OR A paper copy in the mail If you have any lab test that is abnormal or we need to change your treatment, we will call you to review the results.   Testing/Procedures: Exercise Tolerance Test  Your physician has requested that you have an echocardiogram. Echocardiography is a painless test that uses sound waves to create images of your heart. It provides your doctor with information about the size and shape of your heart and how well your heart's chambers and valves are working. This procedure takes approximately one hour. There are no restrictions for this procedure. Please do NOT wear cologne, perfume, aftershave, or lotions (deodorant is allowed). Please arrive 15 minutes prior to your appointment time.   Follow-Up: At [facility], you and your health needs are our priority.  As part of our continuing mission to provide you with exceptional heart care, we have created designated Provider Care Teams.  These Care Teams include your primary Cardiologist (physician) and Advanced Practice Providers (APPs -  Physician Assistants and Nurse Practitioners) who all work together to provide you with the care you need, when you need it.  We recommend signing up for the patient portal called "MyChart".  Sign up information is provided on this After Visit Summary.  MyChart is used to connect with patients for Virtual Visits (Telemedicine).  Patients are able to view lab/test results, encounter notes, upcoming appointments, etc.   Non-urgent messages can be sent to your provider as well.   To learn more about what you can do with MyChart, go to [email or web address].    Your next appointment:   As needed  Other Instructions   Important Information About Sugar

## 2022-01-13 NOTE — Telephone Encounter (Signed)
The patient was seen today ion the office and was scheduled for stress test and Echocardiogram.  The patient requested I contact his employer to make sure the stress test  and echo are needed.  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   Left detailed message on the patients voicemail stating the stress test and echo are required. Requested a call back with any questions or concerns.

## 2022-01-13 NOTE — Progress Notes (Signed)
I,Tianna Badgett,acting as a Education administrator for [employer], FNP.,have documented all relevant documentation on the behalf of [name], FNP,as directed by  [name], FNP while in the presence of [name], [place].   Subjective:     Patient ID: Patrick Woodward , male    DOB: 1940-08-21 , 81 y.o.   MRN: [id number]   Chief Complaint  Patient presents with   Annual Exam    HPI  Here for HM.  He has an appt with Cardiology today because he has to have a cardiac clearance.   Hypertension This is a chronic problem. The current episode started more than 1 year ago. The problem is controlled. Pertinent negatives include no anxiety, chest pain or palpitations. There are no associated agents to hypertension. Risk factors for coronary artery disease include male gender, sedentary lifestyle and dyslipidemia. Past treatments include beta blockers. Compliance problems include exercise.  Identifiable causes of hypertension include chronic renal disease.     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 (Patient not taking: Reported on 01/20/2022), Disp: 20 tablet, Rfl: 0   vitamin E 180 MG (400 UNITS) capsule, Take 400 Units by mouth daily., Disp: , Rfl:    atorvastatin  (LIPITOR) 20 MG tablet, Take 1 tablet (20 mg total) by mouth daily., Disp: 90 tablet, Rfl: 1   metoprolol succinate (TOPROL XL) 25 MG 24 hr tablet, Take 1 tablet (25 mg total) by mouth daily. With the 50 mg dose (total 75 mg), Disp: 90 tablet, Rfl: 1   metoprolol succinate (TOPROL-XL) 50 MG 24 hr tablet, TAKE 1 TABLET BY MOUTH ONCE DAILY IN AM WITH 25 MG TABLET (TOTAL 75 MG), Disp: 90 tablet, Rfl: 1   sildenafil (VIAGRA) 50 MG tablet, Take 1 tablet (50 mg total) by mouth daily as needed for erectile dysfunction., Disp: 30 tablet, Rfl: 5   Allergies  Allergen Reactions   Procardia [Nifedipine] Other (See Comments)    irratic heart rate      Men's preventive visit. Patient Health Questionnaire (PHQ-2) is  [place] Office Visit from 01/13/2022 in [facility]  PHQ-2 Total Score 0      Patient is on a Regular diet. Exercising - minimal 2-3 times a week. Marital status: Divorced. Relevant history for alcohol use is:  Social History   Substance and Sexual Activity  Alcohol Use No  . Relevant history for tobacco use is:  Social History   Tobacco Use  Smoking Status Former   Packs/day: 1.00   Years: 20.00   Total pack years: 20.00   Types:  Cigarettes, Pipe, Cigars  Smokeless Tobacco Never  Tobacco Comments   12/02/2011 "stopped smoking ~ 30 yr ago"  .   Review of Systems  Constitutional: Negative.   HENT: Negative.    Eyes: Negative.   Respiratory: Negative.    Cardiovascular: Negative.  Negative for chest pain and palpitations.  Gastrointestinal: Negative.   Endocrine: Negative.   Genitourinary: Negative.   Musculoskeletal: Negative.   Skin: Negative.   Allergic/Immunologic: Negative.   Neurological: Negative.   Hematological: Negative.   Psychiatric/Behavioral: Negative.       Today's Vitals   01/13/22 1101  BP: 136/80  Pulse: 74  Temp: 98.3 F (36.8 C)  TempSrc: Oral  Weight: 211 lb (95.7 kg)  Height: 6' 4.4" (1.941 m)   Body mass  index is 25.42 kg/m.   Objective:  Physical Exam Vitals reviewed.  Constitutional:      General: He is not in acute distress.    Appearance: Normal appearance.  HENT:     Head: Normocephalic and atraumatic.     Right Ear: Tympanic membrane, ear canal and external ear normal. There is no impacted cerumen.     Left Ear: Tympanic membrane, ear canal and external ear normal. There is no impacted cerumen.     Nose: Nose normal.     Mouth/Throat:     Mouth: Mucous membranes are moist.  Eyes:     Extraocular Movements: Extraocular movements intact.     Conjunctiva/sclera: Conjunctivae normal.     Pupils: Pupils are equal, round, and reactive to light.  Cardiovascular:     Rate and Rhythm: Normal rate and regular rhythm.     Pulses: Normal pulses.     Heart sounds: Normal heart sounds. No murmur heard. Pulmonary:     Effort: Pulmonary effort is normal. No respiratory distress.     Breath sounds: Normal breath sounds. No wheezing.  Abdominal:     General: Abdomen is flat. Bowel sounds are normal. There is no distension.     Palpations: Abdomen is soft.     Tenderness: There is no abdominal tenderness.  Genitourinary:    Comments: Deferred - followed by Urology Musculoskeletal:        General: No swelling or tenderness. Normal range of motion.     Cervical back: Normal range of motion and neck supple.  Skin:    General: Skin is warm and dry.     Capillary Refill: Capillary refill takes less than 2 seconds.     Comments: Healed surgical scar to mid and left lower abdomen.    Neurological:     General: No focal deficit present.     Mental Status: He is alert and oriented to person, place, and time.     Cranial Nerves: No cranial nerve deficit.     Motor: No weakness.  Psychiatric:        Mood and Affect: Mood normal.        Behavior: Behavior normal.        Thought Content: Thought content normal.        Judgment: Judgment normal.         Assessment And Plan:    1.  Encounter for general adult medical examination w/o abnormal findings Behavior modifications discussed and diet history reviewed.   Pt will continue to exercise regularly and modify diet with low GI, plant based foods and decrease intake of processed foods.  Recommend intake of daily multivitamin, Vitamin D, and calcium.  Recommend colonoscopy for preventive screenings,  as well as recommend immunizations that include influenza, TDAP, and Shingles  2. Benign hypertension with chronic kidney disease, stage III (HCC) Comments: Blood pressure is fairly controlled, continue current medications - Microalbumin / Creatinine Urine Ratio - POCT Urinalysis Dipstick (81002) - CMP14+EGFR - metoprolol succinate (TOPROL XL) 25 MG 24 hr tablet; Take 1 tablet (25 mg total) by mouth daily. With the 50 mg dose (total 75 mg)  Dispense: 90 tablet; Refill: 1 - metoprolol succinate (TOPROL-XL) 50 MG 24 hr tablet; TAKE 1 TABLET BY MOUTH ONCE DAILY IN AM WITH 25 MG TABLET (TOTAL 75 MG)  Dispense: 90 tablet; Refill: 1  3. Mixed hyperlipidemia Comments: Cholesterol levels are stable. Continue statin, tolerating well. - CMP14+EGFR - Lipid panel - atorvastatin (LIPITOR) 20 MG tablet; Take 1 tablet (20 mg total) by mouth daily.  Dispense: 90 tablet; Refill: 1  4. History of colon cancer Comments: Continue f/u with GI/Oncology  5. Other long term (current) drug therapy - CBC  6. Erectile dysfunction, unspecified erectile dysfunction type Comments: Current dose 20 mg has not been effective, has been taking 3 at a time, will increase dose to 50 mg he is advised to not take more than 100 mg at a time. - sildenafil (VIAGRA) 50 MG tablet; Take 1 tablet (50 mg total) by mouth daily as needed for erectile dysfunction.  Dispense: 30 tablet; Refill: 5   Patient was given opportunity to ask questions. Patient verbalized understanding of the plan and was able to repeat key elements of the plan. All questions were answered to  their satisfaction.   [name], FNP   I, [name], FNP, have reviewed all documentation for this visit. The documentation on 01/13/22 for the exam, diagnosis, procedures, and orders are all accurate and complete.   THE PATIENT IS ENCOURAGED TO PRACTICE SOCIAL DISTANCING DUE TO THE COVID-19 PANDEMIC.

## 2022-01-13 NOTE — Patient Instructions (Signed)
Health Maintenance, Male Adopting a healthy lifestyle and getting preventive care are important in promoting health and wellness. Ask your health care provider about: The right schedule for you to have regular tests and exams. Things you can do on your own to prevent diseases and keep yourself healthy. What should I know about diet, weight, and exercise? Eat a healthy diet  Eat a diet that includes plenty of vegetables, fruits, low-fat dairy products, and lean protein. Do not eat a lot of foods that are high in solid fats, added sugars, or sodium. Maintain a healthy weight Body mass index (BMI) is a measurement that can be used to identify possible weight problems. It estimates body fat based on height and weight. Your health care provider can help determine your BMI and help you achieve or maintain a healthy weight. Get regular exercise Get regular exercise. This is one of the most important things you can do for your health. Most adults should: Exercise for at least 150 minutes each week. The exercise should increase your heart rate and make you sweat (moderate-intensity exercise). Do strengthening exercises at least twice a week. This is in addition to the moderate-intensity exercise. Spend less time sitting. Even light physical activity can be beneficial. Watch cholesterol and blood lipids Have your blood tested for lipids and cholesterol at 79 years of age, then have this test every 5 years. You may need to have your cholesterol levels checked more often if: Your lipid or cholesterol levels are high. You are older than 79 years of age. You are at high risk for heart disease. What should I know about cancer screening? Many types of cancers can be detected early and may often be prevented. Depending on your health history and family history, you may need to have cancer screening at various ages. This may include screening for: Colorectal cancer. Prostate cancer. Skin cancer. Lung  cancer. What should I know about heart disease, diabetes, and high blood pressure? Blood pressure and heart disease High blood pressure causes heart disease and increases the risk of stroke. This is more likely to develop in people who have high blood pressure readings or are overweight. Talk with your health care provider about your target blood pressure readings. Have your blood pressure checked: Every 3-5 years if you are 18-39 years of age. Every year if you are 40 years old or older. If you are between the ages of 65 and 75 and are a current or former smoker, ask your health care provider if you should have a one-time screening for abdominal aortic aneurysm (AAA). Diabetes Have regular diabetes screenings. This checks your fasting blood sugar level. Have the screening done: Once every three years after age 45 if you are at a normal weight and have a low risk for diabetes. More often and at a younger age if you are overweight or have a high risk for diabetes. What should I know about preventing infection? Hepatitis B If you have a higher risk for hepatitis B, you should be screened for this virus. Talk with your health care provider to find out if you are at risk for hepatitis B infection. Hepatitis C Blood testing is recommended for: Everyone born from 1945 through 1965. Anyone with known risk factors for hepatitis C. Sexually transmitted infections (STIs) You should be screened each year for STIs, including gonorrhea and chlamydia, if: You are sexually active and are younger than 79 years of age. You are older than 79 years of age and your   health care provider tells you that you are at risk for this type of infection. Your sexual activity has changed since you were last screened, and you are at increased risk for chlamydia or gonorrhea. Ask your health care provider if you are at risk. Ask your health care provider about whether you are at high risk for HIV. Your health care provider  may recommend a prescription medicine to help prevent HIV infection. If you choose to take medicine to prevent HIV, you should first get tested for HIV. You should then be tested every 3 months for as long as you are taking the medicine. Follow these instructions at home: Alcohol use Do not drink alcohol if your health care provider tells you not to drink. If you drink alcohol: Limit how much you have to 0-2 drinks a day. Know how much alcohol is in your drink. In the [place] one drink equals one 12 oz bottle of beer (355 mL), one 5 oz glass of wine (148 mL), or one 1 oz glass of hard liquor (44 mL). Lifestyle Do not use any products that contain nicotine or tobacco. These products include cigarettes, chewing tobacco, and vaping devices, such as e-cigarettes. If you need help quitting, ask your health care provider. Do not use street drugs. Do not share needles. Ask your health care provider for help if you need support or information about quitting drugs. General instructions Schedule regular health, dental, and eye exams. Stay current with your vaccines. Tell your health care provider if: You often feel depressed. You have ever been abused or do not feel safe at home. Summary Adopting a healthy lifestyle and getting preventive care are important in promoting health and wellness. Follow your health care provider's instructions about healthy diet, exercising, and getting tested or screened for diseases. Follow your health care provider's instructions on monitoring your cholesterol and blood pressure. This information is not intended to replace advice given to you by your health care provider. Make sure you discuss any questions you have with your health care provider. Document Revised: 06/17/2020 Document Reviewed: 06/17/2020 Elsevier Patient Education  2023 Elsevier Inc.  

## 2022-01-14 ENCOUNTER — Ambulatory Visit: Payer: Medicare Other | Attending: Cardiology

## 2022-01-14 DIAGNOSIS — E782 Mixed hyperlipidemia: Secondary | ICD-10-CM

## 2022-01-14 DIAGNOSIS — I10 Essential (primary) hypertension: Secondary | ICD-10-CM | POA: Diagnosis not present

## 2022-01-14 DIAGNOSIS — Z024 Encounter for examination for driving license: Secondary | ICD-10-CM | POA: Diagnosis not present

## 2022-01-15 LAB — LIPID PANEL
Chol/HDL Ratio: 4.8 ratio (ref 0.0–5.0)
Cholesterol, Total: 144 mg/dL (ref 100–199)
HDL: 30 mg/dL — ABNORMAL LOW (ref >39)
LDL Chol Calc (NIH): 77 mg/dL (ref 0–99)
Triglycerides: 219 mg/dL — ABNORMAL HIGH (ref 0–149)
VLDL Cholesterol Cal: 37 mg/dL (ref 5–40)

## 2022-01-15 LAB — EXERCISE TOLERANCE TEST
Angina Index: 0
Duke Treadmill Score: 3
Estimated workload: 2.3
Exercise duration (min): 3 min
Exercise duration (sec): 0 s
MPHR: 141 {beats}/min
Peak HR: 144 {beats}/min
Percent HR: 102 %
RPE: 15
Rest HR: 91 {beats}/min
ST Depression (mm): 0 mm

## 2022-01-15 LAB — CMP14+EGFR
ALT: 30 IU/L (ref 0–44)
AST: 30 IU/L (ref 0–40)
Albumin/Globulin Ratio: 1.8 (ref 1.2–2.2)
Albumin: 4.8 g/dL (ref 3.8–4.8)
Alkaline Phosphatase: 84 IU/L (ref 44–121)
BUN/Creatinine Ratio: 9 — ABNORMAL LOW (ref 10–24)
BUN: 12 mg/dL (ref 8–27)
Bilirubin Total: 0.8 mg/dL (ref 0.0–1.2)
CO2: 22 mmol/L (ref 20–29)
Calcium: 9.4 mg/dL (ref 8.6–10.2)
Chloride: 102 mmol/L (ref 96–106)
Creatinine, Ser: 1.3 mg/dL — ABNORMAL HIGH (ref 0.76–1.27)
Globulin, Total: 2.6 g/dL (ref 1.5–4.5)
Glucose: 97 mg/dL (ref 70–99)
Potassium: 4.2 mmol/L (ref 3.5–5.2)
Sodium: 140 mmol/L (ref 134–144)
Total Protein: 7.4 g/dL (ref 6.0–8.5)
eGFR: 56 mL/min/{1.73_m2} — ABNORMAL LOW (ref >59)

## 2022-01-15 LAB — CBC
Hematocrit: 48.7 % (ref 37.5–51.0)
Hemoglobin: 16.1 g/dL (ref 13.0–17.7)
MCH: 28 pg (ref 26.6–33.0)
MCHC: 33.1 g/dL (ref 31.5–35.7)
MCV: 85 fL (ref 79–97)
Platelets: 182 10*3/uL (ref 150–450)
RBC: 5.75 x10E6/uL (ref 4.14–5.80)
RDW: 15.9 % — ABNORMAL HIGH (ref 11.6–15.4)
WBC: 4.9 10*3/uL (ref 3.4–10.8)

## 2022-01-15 LAB — MICROALBUMIN / CREATININE URINE RATIO
Creatinine, Urine: 90.7 mg/dL
Microalb/Creat Ratio: 24 mg/g creat (ref 0–29)
Microalbumin, Urine: 21.9 ug/mL

## 2022-01-16 ENCOUNTER — Other Ambulatory Visit: Payer: Self-pay | Admitting: *Deleted

## 2022-01-16 DIAGNOSIS — C189 Malignant neoplasm of colon, unspecified: Secondary | ICD-10-CM

## 2022-01-19 ENCOUNTER — Ambulatory Visit (HOSPITAL_COMMUNITY): Payer: Medicare Other | Attending: Nurse Practitioner

## 2022-01-19 DIAGNOSIS — I10 Essential (primary) hypertension: Secondary | ICD-10-CM | POA: Diagnosis not present

## 2022-01-19 DIAGNOSIS — E782 Mixed hyperlipidemia: Secondary | ICD-10-CM

## 2022-01-19 DIAGNOSIS — I119 Hypertensive heart disease without heart failure: Secondary | ICD-10-CM | POA: Insufficient documentation

## 2022-01-19 DIAGNOSIS — Z87891 Personal history of nicotine dependence: Secondary | ICD-10-CM | POA: Insufficient documentation

## 2022-01-19 DIAGNOSIS — E785 Hyperlipidemia, unspecified: Secondary | ICD-10-CM | POA: Insufficient documentation

## 2022-01-19 DIAGNOSIS — Z024 Encounter for examination for driving license: Secondary | ICD-10-CM

## 2022-01-19 LAB — ECHOCARDIOGRAM COMPLETE
Area-P 1/2: 3.4 cm2
S' Lateral: 2.3 cm

## 2022-01-20 ENCOUNTER — Inpatient Hospital Stay: Payer: Medicare Other | Admitting: Oncology

## 2022-01-20 ENCOUNTER — Inpatient Hospital Stay: Payer: Medicare Other | Attending: Oncology

## 2022-01-20 VITALS — BP 128/90 | HR 86 | Temp 98.1°F | Resp 18 | Ht 77.0 in | Wt 206.6 lb

## 2022-01-20 DIAGNOSIS — C189 Malignant neoplasm of colon, unspecified: Secondary | ICD-10-CM | POA: Diagnosis not present

## 2022-01-20 DIAGNOSIS — Z85038 Personal history of other malignant neoplasm of large intestine: Secondary | ICD-10-CM | POA: Diagnosis not present

## 2022-01-20 DIAGNOSIS — Z08 Encounter for follow-up examination after completed treatment for malignant neoplasm: Secondary | ICD-10-CM | POA: Insufficient documentation

## 2022-01-20 LAB — CEA (ACCESS): CEA (CHCC): 6.61 ng/mL — ABNORMAL HIGH (ref 0.00–5.00)

## 2022-01-20 NOTE — Progress Notes (Signed)
Patrick OFFICE PROGRESS NOTE   Diagnosis: Colon cancer  INTERVAL HISTORY:   [name] returns as scheduled.  He feels well.  Good appetite.  No difficulty with bowel function.  No complaint.  He has mild intermittent numbness in the hands and feet.  He reports he was evaluated by neurology in the past.  This does not interfere with activity and does not occur on a daily basis.  He does not smoke.  He is due for a surveillance colonoscopy.  He is concerned about the cost.  Objective:  Vital signs in last 24 hours:  Blood pressure (!) 128/90, pulse 86, temperature 98.1 F (36.7 C), temperature source Oral, resp. rate 18, height '6\' 5"'$  (1.956 m), weight 206 lb 9.6 oz (93.7 kg), SpO2 99 %.    Lymphatics: No cervical, supraclavicular, axillary, or inguinal nodes Resp: Distant breath sounds, no respiratory distress Cardio: Regular rate and rhythm GI: No hepatosplenomegaly, no mass, nontender Vascular: No leg edema   Lab Results  Component Value Date   WBC 4.9 01/13/2022   HGB 16.1 01/13/2022   HCT 48.7 01/13/2022   MCV 85 01/13/2022   PLT 182 01/13/2022   NEUTROABS 2.7 10/25/2013    CMP  Lab Results  Component Value Date   NA 140 01/13/2022   K 4.2 01/13/2022   CL 102 01/13/2022   CO2 22 01/13/2022   GLUCOSE 97 01/13/2022   BUN 12 01/13/2022   CREATININE 1.30 (H) 01/13/2022   CALCIUM 9.4 01/13/2022   PROT 7.4 01/13/2022   ALBUMIN 4.8 01/13/2022   AST 30 01/13/2022   ALT 30 01/13/2022   ALKPHOS 84 01/13/2022   BILITOT 0.8 01/13/2022   GFRNONAA 55 (L) 11/23/2019   GFRAA 63 11/23/2019    Lab Results  Component Value Date   CEA1 5.67 (H) 11/19/2020   CEA 6.61 (H) 01/20/2022    Lab Results  Component Value Date   INR 1.01 10/15/2010   LABPROT 13.5 10/15/2010    Imaging:  ECHOCARDIOGRAM COMPLETE  Result Date: 01/19/2022    ECHOCARDIOGRAM REPORT   Patient Name:   Patrick Woodward Date of Exam: 01/19/2022 Medical Rec #:  [id number]            Height:       77.0 in Accession #:    [id number]          Weight:       208.0 lb Date of Birth:  1940-11-11          BSA:          2.274 m Patient Age:    25 years            BP:           126/78 mmHg Patient Gender: M                   HR:           101 bpm. Exam Location:  [address] Procedure: 2D Echo, Cardiac Doppler and Color Doppler Indications:    I10 Hypertension  History:        Patient has prior history of Echocardiogram examinations, most                 recent 01/14/2021. Risk Factors:Hypertension, Former Smoker and                 Dyslipidemia.  Sonographer:    Coralyn Helling RDCS Referring Phys: [id number] Cherie Dark, Brooke Bonito  DICK IMPRESSIONS  1. Left ventricular ejection fraction, by estimation, is 60 to 65%. The left ventricle has normal function. The left ventricle has no regional wall motion abnormalities. There is moderate left ventricular hypertrophy. Left ventricular diastolic parameters are consistent with Grade I diastolic dysfunction (impaired relaxation).  2. Right ventricular systolic function is normal. The right ventricular size is normal.  3. The mitral valve is normal in structure. No evidence of mitral valve regurgitation. No evidence of mitral stenosis.  4. The aortic valve is tricuspid. There is moderate calcification of the aortic valve. There is moderate thickening of the aortic valve. Aortic valve regurgitation is not visualized. Aortic valve sclerosis is present, with no evidence of aortic valve stenosis.  5. The inferior vena cava is normal in size with greater than 50% respiratory variability, suggesting right atrial pressure of 3 mmHg. FINDINGS  Left Ventricle: Left ventricular ejection fraction, by estimation, is 60 to 65%. The left ventricle has normal function. The left ventricle has no regional wall motion abnormalities. The left ventricular internal cavity size was normal in size. There is  moderate left ventricular hypertrophy. Left ventricular diastolic parameters are  consistent with Grade I diastolic dysfunction (impaired relaxation). Right Ventricle: The right ventricular size is normal. No increase in right ventricular wall thickness. Right ventricular systolic function is normal. Left Atrium: Left atrial size was normal in size. Right Atrium: Right atrial size was normal in size. Pericardium: There is no evidence of pericardial effusion. Mitral Valve: The mitral valve is normal in structure. No evidence of mitral valve regurgitation. No evidence of mitral valve stenosis. Tricuspid Valve: The tricuspid valve is normal in structure. Tricuspid valve regurgitation is trivial. No evidence of tricuspid stenosis. Aortic Valve: The aortic valve is tricuspid. There is moderate calcification of the aortic valve. There is moderate thickening of the aortic valve. Aortic valve regurgitation is not visualized. Aortic valve sclerosis is present, with no evidence of aortic valve stenosis. Pulmonic Valve: The pulmonic valve was normal in structure. Pulmonic valve regurgitation is not visualized. No evidence of pulmonic stenosis. Aorta: The aortic root is normal in size and structure. Venous: The inferior vena cava is normal in size with greater than 50% respiratory variability, suggesting right atrial pressure of 3 mmHg. IAS/Shunts: The interatrial septum was not well visualized.  LEFT VENTRICLE PLAX 2D LVIDd:         3.80 cm   Diastology LVIDs:         2.30 cm   LV e' medial:    7.51 cm/s LV PW:         1.60 cm   LV E/e' medial:  11.9 LV IVS:        1.60 cm   LV e' lateral:   8.16 cm/s LVOT diam:     1.80 cm   LV E/e' lateral: 11.0 LV SV:         68 LV SV Index:   30 LVOT Area:     2.54 cm  RIGHT VENTRICLE RV S prime:     25.60 cm/s TAPSE (M-mode): 2.1 cm LEFT ATRIUM           Index        RIGHT ATRIUM           Index LA diam:      3.50 cm 1.54 cm/m   RA Pressure: 3.00 mmHg LA Vol (A2C): 37.3 ml 16.40 ml/m  RA Area:     16.40 cm LA Vol (A4C): 53.0 ml 23.30  ml/m  RA Volume:   45.60 ml   20.05 ml/m  AORTIC VALVE LVOT Vmax:   142.00 cm/s LVOT Vmean:  103.000 cm/s LVOT VTI:    0.267 m  AORTA Ao Root diam: 3.30 cm Ao Asc diam:  3.70 cm MITRAL VALVE                TRICUSPID VALVE MV Area (PHT): 3.40 cm     Estimated RAP:  3.00 mmHg MV Decel Time: 223 msec MV E velocity: 89.60 cm/s   SHUNTS MV A velocity: 139.00 cm/s  Systemic VTI:  0.27 m MV E/A ratio:  0.64         Systemic Diam: 1.80 cm Jenkins Rouge MD Electronically signed by Jenkins Rouge MD Signature Date/Time: 01/19/2022/12:43:54 PM    Final     Medications: I have reviewed the patient's current medications.   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 Status post cycle #1 adjuvant FOLFOX chemotherapy 12/01/2010. He completed cycle 2 beginning 12/15/2010. He completed cycle 3 beginning 01/05/2011 with Neulasta support. He completed cycle 11 on 05/04/2011. He completed the 12th and final cycle on 05/18/2011.   Negative colonoscopy 09/08/2011, benign polypoid lesion in the descending colon. Colonoscopy 08/28/2013 with multiple benign polyps removed. Restaging CT scans 10/26/2012 with no evidence of metastatic disease. Restaging CT scans 06/24/2013 with no evidence of recurrent disease Surveillance colonoscopy 08/17/2016- 16 mm polyp in the cecum; 10 mm polyp in the ascending colon; 2 mm polyp in the ascending colon; 5 mm polyp in the transverse colon.  Pathology on the cecal polyp-tubular adenoma, 1.6 cm; pathology on polyps descending/transverse colon-tubular adenomas. Mildly elevated CEA 12/07/2016, stable mild elevation 01/07/2017, improved but continued mild elevation 02/23/2017; CT scans recommended, patient declined due to renal dysfunction.  Chronic mild elevation of the CEA History of a bowel obstruction secondary to the primary colon mass. History of abdominal pain/constipation secondary  to the descending/sigmoid colon mass. History of weight loss. Improved. Status post Port-A-Cath placement 11/27/2010. Removed 12/02/2011. History of neutropenia secondary to chemotherapy. Cycle 3 FOLFOX was held 12/29/2010 due to neutropenia. He received Neulasta with cycle #3 FOLFOX chemotherapy on 01/05/2011. He did not receive Neulasta following cycle 4. Cycle 5 was held for one week due to neutropenia. He received Neulasta with subsequent treatments. Neulasta was held beginning with cycle 10 since the oxaliplatin was discontinued. Thrombocytopenia secondary to chemotherapy. Oxaliplatin was dose reduced beginning with cycle 7. Back pain following cycle 3 FOLFOX. Likely related to Neulasta. 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 Improved.   Delayed nausea following cycle 7 of FOLFOX. Aloxi was added to the antiemetic regimen beginning with cycle 8 FOLFOX. He also began prophylactic Decadron on days 2 and 3 following chemotherapy with cycle 8. He did not have nausea following cycle 8 or cycle 9 FOLFOX Colostomy reversal 12/02/2011. Mild elevation of the creatinine 10/26/2012. Repeat value on 10/28/2012 stable at 1.5. He is followed by nephrology         Disposition: Mr Helzer is in clinical remission from colon cancer.  The CEA is mildly elevated today.  The CEA has been mildly elevated in the past.  He will return for a CEA in 4 months to be sure there is not a consistent rise.  He will return for an office visit in 1 year.  We will refer him to Dr. Michail Sermon for a  surveillance colonoscopy.  Betsy Coder, MD  01/20/2022  12:56 PM

## 2022-01-27 ENCOUNTER — Encounter: Payer: Self-pay | Admitting: *Deleted

## 2022-01-27 NOTE — Progress Notes (Signed)
Faxed referral order, demographics, med list and last note/labs to [facility] to see Dr. Michail Sermon. Due surveillance colonoscopy.

## 2022-02-05 ENCOUNTER — Other Ambulatory Visit (HOSPITAL_COMMUNITY): Payer: Medicare Other

## 2022-03-25 ENCOUNTER — Encounter: Payer: Self-pay | Admitting: *Deleted

## 2022-04-29 ENCOUNTER — Telehealth: Payer: Self-pay | Admitting: *Deleted

## 2022-04-29 NOTE — Telephone Encounter (Signed)
Confirmed w/[facility] that Patrick Woodward still has not called to schedule his f/u there. They have sent him a letter as well. Called and left him a VM on home/mobile number with contact information for [facility] to call.

## 2022-05-22 ENCOUNTER — Inpatient Hospital Stay: Payer: Medicare Other

## 2022-05-25 ENCOUNTER — Encounter: Payer: Self-pay | Admitting: Nurse Practitioner

## 2022-05-25 ENCOUNTER — Ambulatory Visit (INDEPENDENT_AMBULATORY_CARE_PROVIDER_SITE_OTHER): Payer: Medicare Other | Admitting: Nurse Practitioner

## 2022-05-25 VITALS — BP 120/58 | HR 70 | Temp 98.4°F | Ht 77.0 in | Wt 206.0 lb

## 2022-05-25 DIAGNOSIS — C189 Malignant neoplasm of colon, unspecified: Secondary | ICD-10-CM

## 2022-05-25 DIAGNOSIS — E782 Mixed hyperlipidemia: Secondary | ICD-10-CM | POA: Diagnosis not present

## 2022-05-25 DIAGNOSIS — I10 Essential (primary) hypertension: Secondary | ICD-10-CM

## 2022-05-25 NOTE — Progress Notes (Signed)
I,Sheena H Holbrook,acting as a Neurosurgeon for Arnette Felts, FNP.,have documented all relevant documentation on the behalf of Arnette Felts, FNP,as directed by  Arnette Felts, FNP while in the presence of Arnette Felts, FNP.    Subjective:     Patient ID: Patrick Woodward , male    DOB: [birth year] , 82 y.o.   MRN: [id number]   Chief Complaint  Patient presents with   Medical Management of Chronic Issues    HPI  Patient presents today for hypertension follow up. Patient has no other complaints or concerns.   He had to reschedule his appt with Oncology. Reports he has been doing well. He is not wanting to pay $260 for his deductible for his colonoscopy that his Oncologist ordered, he is rescheduled for June 6th. State "I don't have any health issues I feel good"   Hypertension This is a chronic problem. The current episode started more than 1 year ago. The problem has been gradually worsening since onset. The problem is uncontrolled. Pertinent negatives include no anxiety, chest pain, headaches or palpitations. There are no associated agents to hypertension. Risk factors for coronary artery disease include male gender and sedentary lifestyle. Past treatments include beta blockers. The current treatment provides no improvement. Compliance problems include diet (eats increased amounts of salt).  There is no history of angina or kidney disease. There is no history of chronic renal disease.     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 With the 50 mg dose (total 75 mg), Disp: 90 tablet, Rfl: 1   metoprolol succinate (TOPROL-XL) 50 MG 24 hr tablet, TAKE 1 TABLET BY MOUTH ONCE DAILY IN AM WITH 25 MG TABLET (TOTAL 75 MG), Disp: 90 tablet, Rfl: 1   Multiple Vitamin (MULTIVITAMIN PO), Take 1 tablet by mouth daily., Disp: , Rfl:    sildenafil (VIAGRA) 50 MG tablet, Take 1 tablet (50 mg total) by mouth daily as needed for erectile dysfunction., Disp: 30 tablet, Rfl: 5   vitamin E 180 MG (400 UNITS) capsule, Take 400 Units by mouth daily., Disp: , Rfl:    Allergies  Allergen Reactions   Procardia [Nifedipine] Other (See Comments)    irratic heart rate      Review of Systems  Constitutional: Negative.   Respiratory: Negative.    Cardiovascular: Negative.  Negative for chest pain and palpitations.  Gastrointestinal: Negative.   Neurological: Negative.  Negative for headaches.     Today's Vitals   05/25/22 0933  BP: (!) 120/58  Pulse: 70  Temp: 98.4 F (36.9 C)  TempSrc: Oral  SpO2: 96%  Weight: 206 lb (93.4 kg)  Height: 6\' 5"  (1.956 m)   Body mass index is 24.43 kg/m.   Objective:  Physical Exam Vitals reviewed.  Constitutional:      General: He is not in acute distress.    Appearance: Normal appearance.  Cardiovascular:     Rate and Rhythm: Normal rate and regular rhythm.     Pulses: Normal pulses.     Heart sounds: Normal heart sounds. No murmur heard. Pulmonary:     Effort: Pulmonary effort is normal. No respiratory distress.     Breath sounds: Normal breath sounds. No wheezing.  Skin:    General: Skin is warm and dry.  Neurological:     General: No focal deficit present.     Mental Status: He is alert and oriented to  person, place, and time.     Cranial Nerves: No cranial nerve deficit.     Motor: No weakness.  Psychiatric:        Mood and Affect: Mood normal.        Behavior: Behavior normal.        Thought Content: Thought content normal.        Judgment: Judgment normal.         Assessment And Plan:     1. Essential hypertension Comments: Blood pressure is well controlled, continue current medications - BMP8+eGFR  2. Mixed hyperlipidemia Comments: Triglycerides are elevated, advised to cut back on breads and sweets. - Lipid panel  3. Malignant neoplasm of colon, unspecified part of colon Comments: Continue f/u with Oncology, he is scheduled for his colonoscopy June 6th.     Patient was given opportunity to ask questions. Patient verbalized understanding of the plan and was able to repeat key elements of the plan. All questions were answered to their satisfaction.  Arnette Felts, FNP   I, Arnette Felts, FNP, have reviewed all documentation for this visit. The documentation on 05/25/22 for the exam, diagnosis, procedures, and orders are all accurate and complete.   IF YOU HAVE BEEN REFERRED TO A SPECIALIST, IT MAY TAKE 1-2 WEEKS TO SCHEDULE/PROCESS THE REFERRAL. IF YOU HAVE NOT HEARD FROM US/SPECIALIST IN TWO WEEKS, PLEASE GIVE [place] A CALL AT [phone] X [phone].   THE PATIENT IS ENCOURAGED TO PRACTICE SOCIAL DISTANCING DUE TO THE COVID-19 PANDEMIC.

## 2022-05-26 LAB — BMP8+EGFR
BUN/Creatinine Ratio: 13 (ref 10–24)
BUN: 17 mg/dL (ref 8–27)
CO2: 24 mmol/L (ref 20–29)
Calcium: 9.5 mg/dL (ref 8.6–10.2)
Chloride: 101 mmol/L (ref 96–106)
Creatinine, Ser: 1.32 mg/dL — ABNORMAL HIGH (ref 0.76–1.27)
Glucose: 93 mg/dL (ref 70–99)
Potassium: 4.4 mmol/L (ref 3.5–5.2)
Sodium: 137 mmol/L (ref 134–144)
eGFR: 55 mL/min/{1.73_m2} — ABNORMAL LOW (ref >59)

## 2022-05-26 LAB — LIPID PANEL
Chol/HDL Ratio: 3.3 ratio (ref 0.0–5.0)
Cholesterol, Total: 109 mg/dL (ref 100–199)
HDL: 33 mg/dL — ABNORMAL LOW (ref >39)
LDL Chol Calc (NIH): 58 mg/dL (ref 0–99)
Triglycerides: 90 mg/dL (ref 0–149)
VLDL Cholesterol Cal: 18 mg/dL (ref 5–40)

## 2022-06-01 ENCOUNTER — Other Ambulatory Visit: Payer: Self-pay | Admitting: Nurse Practitioner

## 2022-06-01 DIAGNOSIS — N183 Chronic kidney disease, stage 3 unspecified: Secondary | ICD-10-CM

## 2022-07-01 ENCOUNTER — Encounter: Payer: Self-pay | Admitting: Nurse Practitioner

## 2022-07-10 ENCOUNTER — Telehealth: Payer: Self-pay

## 2022-07-10 NOTE — Patient Outreach (Signed)
  Care Coordination   07/10/2022 Name: Patrick Woodward MRN: [id number] DOB: [birth year]   Care Coordination Outreach Attempts:  An unsuccessful telephone outreach was attempted today to offer the patient information about available care coordination services.  Follow Up Plan:  Additional outreach attempts will be made to offer the patient care coordination information and services.   Encounter Outcome:  No Answer   Care Coordination Interventions:  No, not indicated    Bevelyn Ngo, BSW, CDP Social Worker, Certified Dementia Practitioner [facility] Care Management  Care Coordination [phone]

## 2022-07-17 ENCOUNTER — Ambulatory Visit: Payer: Self-pay

## 2022-07-17 NOTE — Patient Instructions (Signed)
Visit Information  Thank you for taking time to visit with me today. Please don't hesitate to contact me if I can be of assistance to you.   Following are the goals we discussed today:  - Engage with RN Care Manager regarding care coordination needs -Contact your primary care provider as needed  Your next appointment is by telephone on 6/14 at 10:00 with RN Care Manager Lawanna Kobus Little  Please call the care guide team at [phone] if you need to cancel or reschedule your appointment.   If you are experiencing a Mental Health or Behavioral Health Crisis or need someone to talk to, please call 1-800-273-TALK (toll free, 24 hour hotline) go to [facility] [address], [place] [phone]) call 911  Patient verbalizes understanding of instructions and care plan provided today and agrees to view in MyChart. Active MyChart status and patient understanding of how to access instructions and care plan via MyChart confirmed with patient.     Bevelyn Ngo, BSW, CDP Social Worker, Certified Dementia Practitioner [facility] Care Management  Care Coordination [phone]

## 2022-07-17 NOTE — Patient Outreach (Signed)
  Care Coordination   Initial Visit Note   07/17/2022 Name: Patrick Woodward MRN: [id number] DOB: 1940-08-23  Patrick Woodward is a 82 y.o. year old male who sees Arnette Felts, FNP for primary care. I spoke with  Patrick Woodward by phone today.  What matters to the patients health and wellness today?  The patient would like to learn how to improve his lab results    Goals Addressed             This Visit's Progress    COMPLETED: Care Coordination Activities       Care Coordination Interventions: SDoH screening performed - no acute resource needs identified at this time Determined the patient does not have concerns with medication costs and/or adherence Discussed the patients oncologist ordered a colonoscopy but he declined the service as the copay was too high Introduced the role of the care coordination team - patient is agreeable to speak with RN Care Manager Scheduled telephonic outreach with RN Care Manager Lawanna Kobus Little on 6/14 at 10:00 am Collaboration with RN Care Manager advising of patients enrollment and upcomming scheduled appointment         SDOH assessments and interventions completed:  Yes  SDOH Interventions Today    Flowsheet Row Most Recent Value  SDOH Interventions   Food Insecurity Interventions Intervention Not Indicated  Transportation Interventions Intervention Not Indicated  Financial Strain Interventions Intervention Not Indicated        Care Coordination Interventions:  Yes, provided   Interventions Today    Flowsheet Row Most Recent Value  Chronic Disease   Chronic disease during today's visit Hypertension (HTN)  General Interventions   General Interventions Discussed/Reviewed General Interventions Discussed, Referral to Nurse, Doctor Visits  Doctor Visits Discussed/Reviewed Doctor Visits Reviewed  Education Interventions   Education Provided Provided Education  [role of the care coordination team]  Nutrition Interventions    Nutrition Discussed/Reviewed Nutrition Discussed        Follow up plan: Follow up call scheduled for 6/14 with RN Care Manager    Encounter Outcome:  Pt. Visit Completed   Bevelyn Ngo, Kenard Gower, CDP Social Worker, Certified Dementia Practitioner [facility] Care Management  Care Coordination [phone]

## 2022-07-21 ENCOUNTER — Other Ambulatory Visit: Payer: Self-pay | Admitting: Nurse Practitioner

## 2022-07-21 DIAGNOSIS — N529 Male erectile dysfunction, unspecified: Secondary | ICD-10-CM

## 2022-07-21 DIAGNOSIS — E782 Mixed hyperlipidemia: Secondary | ICD-10-CM

## 2022-07-24 ENCOUNTER — Ambulatory Visit: Payer: Self-pay

## 2022-07-24 NOTE — Patient Outreach (Signed)
  Care Coordination   07/24/2022 Name: Patrick Woodward MRN: [id number] DOB: 17-Nov-1940   Care Coordination Outreach Attempts:  An unsuccessful telephone outreach was attempted for a scheduled appointment today.  Follow Up Plan:  Additional outreach attempts will be made to offer the patient care coordination information and services.   Encounter Outcome:  No Answer   Care Coordination Interventions:  No, not indicated    Delsa Sale, RN, BSN, CCM Care Management Coordinator [facility] Care Management  Direct Phone: [phone]

## 2022-08-14 ENCOUNTER — Telehealth: Payer: Self-pay | Admitting: *Deleted

## 2022-08-14 NOTE — Progress Notes (Unsigned)
  Care Coordination Note  08/14/2022 Name: Patrick Woodward MRN: [id number] DOB: October 31, 1940  Patrick Woodward is a 82 y.o. year old male who is a primary care patient of Arnette Felts, FNP and is actively engaged with the care management team. I reached out to Randel Books by phone today to assist with re-scheduling an initial visit with the RN Case Manager  Follow up plan: Unsuccessful telephone outreach attempt made. A HIPAA compliant phone message was left for the patient providing contact information and requesting a return call.   [facility]  Care Coordination Care Guide  Direct Dial: [phone]

## 2022-08-18 NOTE — Progress Notes (Signed)
  Care Coordination Note  08/18/2022 Name: [name] MRN: [id number] DOB: 01/06/1941  Patrick Woodward is a 82 y.o. year old male who is a primary care patient of Arnette Felts, FNP and is actively engaged with the care management team. I reached out to Randel Books by phone today to assist with re-scheduling an initial visit with the RN Case Manager  Follow up plan: Unsuccessful telephone outreach attempt made. A HIPAA compliant phone message was left for the patient providing contact information and requesting a return call. We have been unable to make contact with the patient for follow up. The care management team is available to follow up with the patient after provider conversation with the patient regarding recommendation for care management engagement and subsequent re-referral to the care management team.   [facility] Coordination Care Guide  Direct Dial: [phone]

## 2022-08-19 ENCOUNTER — Other Ambulatory Visit: Payer: Self-pay | Admitting: Nurse Practitioner

## 2022-08-19 ENCOUNTER — Telehealth: Payer: Self-pay | Admitting: *Deleted

## 2022-08-19 DIAGNOSIS — N529 Male erectile dysfunction, unspecified: Secondary | ICD-10-CM

## 2022-08-19 NOTE — Progress Notes (Signed)
  Care Coordination Note  08/19/2022 Name: TRAVARUS TRUDO MRN: [id number] DOB: [birth year]  Randel Books is a 82 y.o. year old male who is a primary care patient of Arnette Felts, FNP and is actively engaged with the care management team. I reached out to Randel Books by phone today to assist with re-scheduling an initial visit with the RN Case Manager  Follow up plan: Telephone appointment with care management team member scheduled for:09/01/22  [facility] Coordination Care Guide  Direct Dial: [phone]

## 2022-08-25 ENCOUNTER — Other Ambulatory Visit: Payer: Self-pay

## 2022-08-25 DIAGNOSIS — N529 Male erectile dysfunction, unspecified: Secondary | ICD-10-CM

## 2022-08-25 MED ORDER — SILDENAFIL CITRATE 50 MG PO TABS
50.0000 mg | ORAL_TABLET | Freq: Every day | ORAL | 2 refills | Status: DC | PRN
Start: 2022-08-25 — End: 2022-09-07

## 2022-09-01 ENCOUNTER — Ambulatory Visit: Payer: Self-pay

## 2022-09-01 NOTE — Patient Outreach (Signed)
  Care Coordination   09/01/2022 Name: RAYDEN DOCK MRN: [id number] DOB: 10/04/1940   Care Coordination Outreach Attempts:  An unsuccessful telephone outreach was attempted for a scheduled appointment today.  Follow Up Plan:  Additional outreach attempts will be made to offer the patient care coordination information and services.   Encounter Outcome:  No Answer   Care Coordination Interventions:  No, not indicated    Delsa Sale, RN, BSN, CCM Care Management Coordinator [facility] Care Management  Direct Phone: [phone]

## 2022-09-07 ENCOUNTER — Other Ambulatory Visit: Payer: Self-pay

## 2022-09-07 DIAGNOSIS — E782 Mixed hyperlipidemia: Secondary | ICD-10-CM

## 2022-09-07 DIAGNOSIS — N183 Chronic kidney disease, stage 3 unspecified: Secondary | ICD-10-CM

## 2022-09-07 DIAGNOSIS — N529 Male erectile dysfunction, unspecified: Secondary | ICD-10-CM

## 2022-09-07 MED ORDER — SILDENAFIL CITRATE 50 MG PO TABS
50.0000 mg | ORAL_TABLET | Freq: Every day | ORAL | 2 refills | Status: DC | PRN
Start: 2022-09-07 — End: 2023-01-26

## 2022-09-07 MED ORDER — METOPROLOL SUCCINATE ER 50 MG PO TB24
ORAL_TABLET | ORAL | 1 refills | Status: DC
Start: 2022-09-07 — End: 2023-01-26

## 2022-09-07 MED ORDER — METOPROLOL SUCCINATE ER 25 MG PO TB24
25.0000 mg | ORAL_TABLET | Freq: Every day | ORAL | 0 refills | Status: DC
Start: 2022-09-07 — End: 2023-01-26

## 2022-09-07 MED ORDER — ATORVASTATIN CALCIUM 20 MG PO TABS
20.0000 mg | ORAL_TABLET | Freq: Every day | ORAL | 0 refills | Status: AC
Start: 2022-09-07 — End: ?

## 2022-09-23 ENCOUNTER — Ambulatory Visit (INDEPENDENT_AMBULATORY_CARE_PROVIDER_SITE_OTHER): Payer: Medicare Other | Admitting: Nurse Practitioner

## 2022-09-23 ENCOUNTER — Ambulatory Visit (INDEPENDENT_AMBULATORY_CARE_PROVIDER_SITE_OTHER): Payer: Medicare Other

## 2022-09-23 ENCOUNTER — Encounter: Payer: Self-pay | Admitting: Nurse Practitioner

## 2022-09-23 VITALS — BP 122/64 | HR 84 | Temp 98.3°F | Ht 74.4 in | Wt 199.6 lb

## 2022-09-23 VITALS — BP 122/64 | HR 84 | Temp 98.3°F | Ht 74.0 in | Wt 199.0 lb

## 2022-09-23 DIAGNOSIS — Z85038 Personal history of other malignant neoplasm of large intestine: Secondary | ICD-10-CM

## 2022-09-23 DIAGNOSIS — I10 Essential (primary) hypertension: Secondary | ICD-10-CM

## 2022-09-23 DIAGNOSIS — Z0000 Encounter for general adult medical examination without abnormal findings: Secondary | ICD-10-CM

## 2022-09-23 DIAGNOSIS — Z79899 Other long term (current) drug therapy: Secondary | ICD-10-CM | POA: Diagnosis not present

## 2022-09-23 DIAGNOSIS — C189 Malignant neoplasm of colon, unspecified: Secondary | ICD-10-CM

## 2022-09-23 DIAGNOSIS — E782 Mixed hyperlipidemia: Secondary | ICD-10-CM

## 2022-09-23 HISTORY — DX: Malignant neoplasm of colon, unspecified: C18.9

## 2022-09-23 NOTE — Progress Notes (Signed)
Subjective:   Patrick Woodward is a 82 y.o. male who presents for Medicare Annual/Subsequent preventive examination.  Visit Complete: In person    Review of Systems     Cardiac Risk Factors include: advanced age (>42men, >106 women);dyslipidemia;hypertension;male gender     Objective:    Today's Vitals   09/23/22 0907  BP: 122/64  Pulse: 84  Temp: 98.3 F (36.8 C)  TempSrc: Oral  SpO2: 94%  Weight: 199 lb 9.6 oz (90.5 kg)  Height: 6' 2.4" (1.89 m)   Body mass index is 25.35 kg/m.     09/23/2022    9:15 AM 01/20/2022   12:13 PM 09/17/2021    9:00 AM 04/25/2021   10:25 AM 08/14/2020    9:28 AM 08/09/2019    9:08 AM 08/02/2018    8:54 AM  Advanced Directives  Does Patient Have a Medical Advance Directive? No No No No No No No  Would patient like information on creating a medical advance directive? No - Patient declined No - Patient declined No - Patient declined No - Patient declined No - Patient declined  No - Patient declined    Current Medications (verified) Outpatient Encounter Medications as of 09/23/2022  Medication Sig   aspirin 81 MG tablet Take 1 tablet (81 mg total) by mouth daily.   atorvastatin (LIPITOR) 20 MG tablet Take 1 tablet (20 mg total) by mouth daily.   cholecalciferol (VITAMIN D) 1000 UNITS tablet Take 2,000 Units by mouth daily.   metoprolol succinate (TOPROL-XL) 25 MG 24 hr tablet Take 1 tablet (25 mg total) by mouth daily.   metoprolol succinate (TOPROL-XL) 50 MG 24 hr tablet TAKE 1 TABLET BY MOUTH ONCE DAILY IN AM WITH 25 MG TABLET (TOTAL 75 MG)   Multiple Vitamin (MULTIVITAMIN PO) Take 1 tablet by mouth daily.   sildenafil (VIAGRA) 50 MG tablet Take 1 tablet (50 mg total) by mouth daily as needed for erectile dysfunction.   vitamin E 180 MG (400 UNITS) capsule Take 400 Units by mouth daily.   No facility-administered encounter medications on file as of 09/23/2022.    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 Cornett, MD;  Location: [facility] OR;  Service: General;  Laterality: N/A;   HOT HEMOSTASIS N/A 08/28/2013   Procedure: HOT HEMOSTASIS (ARGON PLASMA COAGULATION/BICAP);  Surgeon: Shirley Friar, MD;  Location: [facility];  Service: Endoscopy;  Laterality: N/A;   PORT-A-CATH REMOVAL  12/02/2011   PORT-A-CATH REMOVAL  12/02/2011   Procedure: REMOVAL PORT-A-CATH;  Surgeon: Clovis Pu. Cornett, MD;  Location: [facility] OR;  Service: General;  Laterality: Left;   PORTACATH PLACEMENT  11/27/10   Dr. Red Christians chest   Family History  Problem Relation Age of Onset   Cancer Mother        colon  Cancer Father        prostate   Social History   Socioeconomic History   Marital status: Divorced    Spouse name: Not on file   Number of children: Not on file   Years of education: Not on file   Highest education level: Not on file  Occupational History   Occupation: retired  Tobacco Use   Smoking status: Former    Current packs/day: 1.00    Average packs/day: 1 pack/day for 20.0 years (20.0 ttl pk-yrs)    Types: Cigarettes, Pipe, Cigars   Smokeless tobacco: Never   Tobacco comments:    12/02/2011 "stopped smoking ~ 30 yr ago"  Vaping Use   Vaping status: Never Used  Substance and Sexual Activity   Alcohol use: No   Drug use: No   Sexual activity: Yes   Other Topics Concern   Not on file  Social History Narrative   Not on file   Social Determinants of Health   Financial Resource Strain: Low Risk  (09/23/2022)   Overall Financial Resource Strain (CARDIA)    Difficulty of Paying Living Expenses: Not hard at all  Food Insecurity: No Food Insecurity (09/23/2022)   Hunger Vital Sign    Worried About Running Out of Food in the Last Year: Never true    Ran Out of Food in the Last Year: Never true  Transportation Needs: No Transportation Needs (09/23/2022)   PRAPARE - Administrator, Civil Service (Medical): No    Lack of Transportation (Non-Medical): No  Physical Activity: Insufficiently Active (09/23/2022)   Exercise Vital Sign    Days of Exercise per Week: 2 days    Minutes of Exercise per Session: 20 min  Stress: No Stress Concern Present (09/23/2022)   [employer] of Occupational Health - Occupational Stress Questionnaire    Feeling of Stress : Not at all  Social Connections: Moderately Isolated (09/23/2022)   Social Connection and Isolation Panel [NHANES]    Frequency of Communication with Friends and Family: More than three times a week    Frequency of Social Gatherings with Friends and Family: More than three times a week    Attends Religious Services: More than 4 times per year    Active Member of [employer] or Organizations: No    Attends Banker Meetings: Never    Marital Status: Divorced    Tobacco Counseling Counseling given: Not Answered Tobacco comments: 12/02/2011 "stopped smoking ~ 30 yr ago"   Clinical Intake:  Pre-visit preparation completed: Yes  Pain : No/denies pain     Nutritional Status: BMI 25 -29 Overweight Nutritional Risks: None Diabetes: No  How often do you need to have someone help you when you read instructions, pamphlets, or other written materials from your doctor or pharmacy?: 1 - Never  Interpreter Needed?: No  Information entered by :: NAllen  LPN   Activities of Daily Living    09/23/2022    9:09 AM  In your present state of health, do you have any difficulty performing the following activities:  Hearing? 0  Vision? 0  Difficulty concentrating or making decisions? 0  Walking or climbing stairs? 0  Dressing or bathing? 0  Doing errands, shopping? 0  Preparing Food and eating ? N  Using the Toilet? N  In the past six months, have you accidently leaked urine? N  Do you have problems with loss of bowel control? N  Managing your Medications? N  Managing your Finances? N  Housekeeping or managing your Housekeeping? N    Patient Care Team: Arnette Felts, FNP as PCP - General (General Practice) Sallye Lat, MD as Consulting Physician (Ophthalmology) Ladene Artist, MD as Consulting Physician (Oncology) Terrial Rhodes, MD as Consulting Physician (Nephrology) Clarene Duke, Karma Lew, RN as [facility] Care Management  Indicate any recent Medical Services you may have received from other than [facility] providers in the past year (date may be approximate).     Assessment:   This is a routine wellness examination for Elton.  Hearing/Vision screen Hearing Screening - Comments:: Denies hearing issues Vision Screening - Comments:: No regular eye exams, [facility]  Dietary issues and exercise activities discussed:     Goals Addressed             This Visit's Progress    Patient Stated       09/23/2022, stay healthy and eat healthy       Depression Screen    09/23/2022    9:16 AM 05/25/2022    9:32 AM 01/13/2022   10:53 AM 09/17/2021    9:02 AM 08/14/2020    9:29 AM 08/14/2020    9:13 AM 08/09/2019    9:09 AM  PHQ 2/9 Scores  PHQ - 2 Score 0 0 0 0 0 0 0  PHQ- 9 Score 0      0    Fall Risk    09/23/2022    9:16 AM 05/25/2022    9:32 AM 01/13/2022   10:53 AM 09/17/2021    9:01 AM 08/14/2020    9:29 AM  Fall Risk   Falls in the past year? 0 0 1 0 0  Number falls in past yr: 0  0 0   Injury with  Fall? 0  1 0   Risk for fall due to : Medication side effect  History of fall(s) Medication side effect Medication side effect  Follow up Falls prevention discussed;Falls evaluation completed  Falls evaluation completed Falls evaluation completed;Education provided;Falls prevention discussed Falls evaluation completed;Education provided;Falls prevention discussed    MEDICARE RISK AT HOME:  Medicare Risk at Home - 09/23/22 0916     Any stairs in or around the home? Yes    If so, are there any without handrails? No    Home free of loose throw rugs in walkways, pet beds, electrical cords, etc? Yes    Adequate lighting in your home to reduce risk of falls? Yes    Life alert? No    Use of a cane, walker or w/c? No    Grab bars in the bathroom? No    Shower chair or bench in shower? Yes    Elevated toilet seat or a handicapped toilet? No             TIMED UP AND GO:  Was the test performed?  Yes  Length of time to ambulate 10 feet: 5 sec Gait steady and fast without use of assistive device    Cognitive Function:        09/23/2022    9:17 AM 09/17/2021    9:04 AM 08/14/2020    9:30 AM 08/09/2019    9:11 AM 08/02/2018    8:59 AM  6CIT Screen  What Year? 0 points 0 points 0 points 0 points 0 points  What month? 0 points 0 points 0 points 0 points 0 points  What time? 0 points 0 points 0 points 0 points 0 points  Count back from 20  0 points 0 points 0 points 0 points 0 points  Months in reverse 0 points 2 points 2 points 4 points 0 points  Repeat phrase 2 points 6 points 4 points 2 points 0 points  Total Score 2 points 8 points 6 points 6 points 0 points    Immunizations Immunization History  Administered Date(s) Administered   Fluad Quad(high Dose 65+) 10/26/2018   Influenza Split 02/23/2011   Influenza, High Dose Seasonal PF 10/30/2016   Influenza-Unspecified 10/26/2011, 10/11/2013, 10/07/2021   Moderna Covid-19 Vaccine Bivalent Booster 10yrs & up 11/19/2020   Moderna  Sars-Covid-2 Vaccination 03/24/2019, 04/22/2019, 12/06/2019   Pneumococcal Conjugate-13 11/04/2017   Pneumococcal Polysaccharide-23 12/03/2011   RSV,unspecified 10/07/2021   Tdap 01/07/2021   Unspecified SARS-COV-2 Vaccination 02/08/2022   Zoster Recombinant(Shingrix) 02/12/2021, 06/03/2021    TDAP status: Up to date  Flu Vaccine status: Due, Education has been provided regarding the importance of this vaccine. Advised may receive this vaccine at local pharmacy or Health Dept. Aware to provide a copy of the vaccination record if obtained from local pharmacy or Health Dept. Verbalized acceptance and understanding.  Pneumococcal vaccine status: Up to date  Covid-19 vaccine status: Completed vaccines  Qualifies for Shingles Vaccine? Yes   Zostavax completed Yes   Shingrix Completed?: Yes  Screening Tests Health Maintenance  Topic Date Due   COVID-19 Vaccine (6 - 2023-24 season) 04/05/2022   INFLUENZA VACCINE  09/10/2022   Medicare Annual Wellness (AWV)  09/23/2023   DTaP/Tdap/Td (2 - Td or Tdap) 01/08/2031   Pneumonia Vaccine 36+ Years old  Completed   Hepatitis C Screening  Completed   Zoster Vaccines- Shingrix  Completed   HPV VACCINES  Aged Out   Lung Cancer Screening  Discontinued   Colonoscopy  Discontinued    Health Maintenance  Health Maintenance Due  Topic Date Due   COVID-19 Vaccine (6 - 2023-24 season) 04/05/2022   INFLUENZA VACCINE  09/10/2022    Colorectal cancer screening: No longer required.   Lung Cancer Screening: (Low Dose CT Chest recommended if Age 11-80 years, 20 pack-year currently smoking OR have quit w/in 15years.) does not qualify.   Lung Cancer Screening Referral: no  Additional Screening:  Hepatitis C Screening: does qualify; Completed 08/09/2019  Vision Screening: Recommended annual ophthalmology exams for early detection of glaucoma and other disorders of the eye. Is the patient up to date with their annual eye exam?  No  Who is the  provider or what is the name of the office in which the patient attends annual eye exams? [facility] If pt is not established with a provider, would they like to be referred to a provider to establish care? No .   Dental Screening: Recommended annual dental exams for proper oral hygiene  Diabetic Foot Exam: n/a  Community Resource Referral / Chronic Care Management: CRR required this visit?  No   CCM required this visit?  No     Plan:     I have personally reviewed and noted the following in the patient's chart:   Medical and social history Use of alcohol, tobacco or illicit drugs  Current medications and supplements including opioid prescriptions. Patient is not currently taking opioid prescriptions. Functional ability and status Nutritional status Physical activity Advanced directives List of other physicians Hospitalizations, surgeries, and ER visits in previous 12 months Vitals Screenings to include cognitive, depression, and falls Referrals and appointments  In addition, I have reviewed and discussed with patient certain preventive protocols, quality metrics, and  best practice recommendations. A written personalized care plan for preventive services as well as general preventive health recommendations were provided to patient.     Barb Merino, LPN   05/18/8117   After Visit Summary: in person  Nurse Notes: none

## 2022-09-23 NOTE — Assessment & Plan Note (Signed)
Cholesterol levels are stable, continue statin 

## 2022-09-23 NOTE — Assessment & Plan Note (Signed)
Clinical remission. Continue f/u with Oncology

## 2022-09-23 NOTE — Progress Notes (Signed)
Madelaine Bhat, CMA,acting as a Neurosurgeon for Patrick Felts, FNP.,have documented all relevant documentation on the behalf of Patrick Felts, FNP,as directed by  Patrick Felts, FNP while in the presence of Patrick Felts, FNP.  Subjective:  Patient ID: Patrick Woodward , male    DOB: 1940-03-21 , 82 y.o.   MRN: [id number]  Chief Complaint  Patient presents with   Hypertension    HPI  Patient presents today for a BP and chol follow up, patient repots compliance with medications. Patient denies any chest pain, SOB, or headaches. Patient has no other concerns today.  He was also seen by Patrick Woodward for AWV. 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     Family History  Problem Relation Age of Onset   Cancer Mother        colon   Cancer Father        prostate     Current Outpatient Medications:    aspirin 81 MG tablet, Take 1 tablet (81 mg total) by mouth daily., Disp: 100 tablet, Rfl: 12   atorvastatin (LIPITOR) 20 MG tablet, Take 1 tablet (20 mg total) by mouth daily., Disp: 90 tablet, Rfl: 0   cholecalciferol (VITAMIN D) 1000 UNITS tablet, Take 2,000 Units by mouth daily., Disp: , Rfl:    metoprolol succinate (TOPROL-XL) 25 MG 24 hr tablet, Take 1 tablet (25 mg total) by mouth daily., Disp: 90 tablet, Rfl: 0   metoprolol succinate (TOPROL-XL) 50 MG 24 hr tablet, TAKE 1 TABLET BY MOUTH ONCE DAILY IN AM WITH 25 MG TABLET (TOTAL 75 MG), Disp: 90 tablet, Rfl: 1   Multiple Vitamin (MULTIVITAMIN PO), Take 1 tablet by mouth daily., Disp: , Rfl:    sildenafil (VIAGRA) 50 MG tablet, Take 1 tablet (50 mg total) by mouth daily as needed for erectile dysfunction., Disp: 30  tablet, Rfl: 2   vitamin E 180 MG (400 UNITS) capsule, Take 400 Units by mouth daily., Disp: , Rfl:    Allergies  Allergen Reactions   Procardia [Nifedipine] Other (See Comments)    irratic heart rate      Review of Systems  Constitutional: Negative.   HENT: Negative.    Eyes: Negative.   Respiratory: Negative.    Cardiovascular: Negative.   Gastrointestinal: Negative.   Genitourinary: Negative.   Musculoskeletal: Negative.   Neurological: Negative.   Psychiatric/Behavioral: Negative.       Today's Vitals   09/23/22 0929  BP: 122/64  Pulse: 84  Temp: 98.3 F (36.8 C)  TempSrc: Oral  Weight: 199 lb (90.3 kg)  Height: 6\' 2"  (1.88 m)  PainSc: 0-No pain   Body mass index is 25.55 kg/m.  Wt Readings from Last 3 Encounters:  09/23/22 199 lb (90.3 kg)  09/23/22 199 lb 9.6 oz (90.5 kg)  05/25/22 206 lb (93.4 kg)     Objective:  Physical Exam Vitals reviewed.  Constitutional:      General: He is not in acute distress.    Appearance: Normal appearance.  Cardiovascular:     Rate and Rhythm: Normal rate and regular rhythm.     Pulses: Normal pulses.  Heart sounds: Normal heart sounds. No murmur heard. Pulmonary:     Effort: Pulmonary effort is normal. No respiratory distress.     Breath sounds: Normal breath sounds. No wheezing.  Skin:    General: Skin is warm and dry.  Neurological:     General: No focal deficit present.     Mental Status: He is alert and oriented to person, place, and time.     Cranial Nerves: No cranial nerve deficit.     Motor: No weakness.  Psychiatric:        Mood and Affect: Mood normal.        Behavior: Behavior normal.        Thought Content: Thought content normal.        Judgment: Judgment normal.         Assessment And Plan:  Essential hypertension Assessment & Plan: Blood pressure is well controlled, continue current medications. Will check eGFR  Orders: -     Basic metabolic panel  Mixed hyperlipidemia Assessment &  Plan: Cholesterol levels are stable, continue statin.   Orders: -     Lipid panel  History of colon cancer Assessment & Plan: Clinical remission. Continue f/u with Oncology   Other long term (current) drug therapy -     CBC    Return for uncontrolled bp 3-91months check.  Patient was given opportunity to ask questions. Patient verbalized understanding of the plan and was able to repeat key elements of the plan. All questions were answered to their satisfaction.    Jeanell Sparrow, FNP, have reviewed all documentation for this visit. The documentation on 09/23/22 for the exam, diagnosis, procedures, and orders are all accurate and complete.   IF YOU HAVE BEEN REFERRED TO A SPECIALIST, IT MAY TAKE 1-2 WEEKS TO SCHEDULE/PROCESS THE REFERRAL. IF YOU HAVE NOT HEARD FROM US/SPECIALIST IN TWO WEEKS, PLEASE GIVE [place] A CALL AT [phone].

## 2022-09-23 NOTE — Patient Instructions (Signed)
Patrick Woodward , Thank you for taking time to come for your Medicare Wellness Visit. I appreciate your ongoing commitment to your health goals. Please review the following plan we discussed and let me know if I can assist you in the future.   Referrals/Orders/Follow-Ups/Clinician Recommendations: none  This is a list of the screening recommended for you and due dates:  Health Maintenance  Topic Date Due   COVID-19 Vaccine (6 - 2023-24 season) 04/05/2022   Flu Shot  09/10/2022   Medicare Annual Wellness Visit  09/23/2023   DTaP/Tdap/Td vaccine (2 - Td or Tdap) 01/08/2031   Pneumonia Vaccine  Completed   Hepatitis C Screening  Completed   Zoster (Shingles) Vaccine  Completed   HPV Vaccine  Aged Out   Screening for Lung Cancer  Discontinued   Colon Cancer Screening  Discontinued    Advanced directives: (Declined) Advance directive discussed with you today. Even though you declined this today, please call our office should you change your mind, and we can give you the proper paperwork for you to fill out.  Next Medicare Annual Wellness Visit scheduled for next year: No, office will schedule appointment  Preventive Care 65 Years and Older, Male  Preventive care refers to lifestyle choices and visits with your health care provider that can promote health and wellness. What does preventive care include? A yearly physical exam. This is also called an annual well check. Dental exams once or twice a year. Routine eye exams. Ask your health care provider how often you should have your eyes checked. Personal lifestyle choices, including: Daily care of your teeth and gums. Regular physical activity. Eating a healthy diet. Avoiding tobacco and drug use. Limiting alcohol use. Practicing safe sex. Taking low doses of aspirin every day. Taking vitamin and mineral supplements as recommended by your health care provider. What happens during an annual well check? The services and screenings done  by your health care provider during your annual well check will depend on your age, overall health, lifestyle risk factors, and family history of disease. Counseling  Your health care provider may ask you questions about your: Alcohol use. Tobacco use. Drug use. Emotional well-being. Home and relationship well-being. Sexual activity. Eating habits. History of falls. Memory and ability to understand (cognition). Work and work Astronomer. Screening  You may have the following tests or measurements: Height, weight, and BMI. Blood pressure. Lipid and cholesterol levels. These may be checked every 5 years, or more frequently if you are over 27 years old. Skin check. Lung cancer screening. You may have this screening every year starting at age 70 if you have a 30-pack-year history of smoking and currently smoke or have quit within the past 15 years. Fecal occult blood test (FOBT) of the stool. You may have this test every year starting at age 64. Flexible sigmoidoscopy or colonoscopy. You may have a sigmoidoscopy every 5 years or a colonoscopy every 10 years starting at age 30. Prostate cancer screening. Recommendations will vary depending on your family history and other risks. Hepatitis C blood test. Hepatitis B blood test. Sexually transmitted disease (STD) testing. Diabetes screening. This is done by checking your blood sugar (glucose) after you have not eaten for a while (fasting). You may have this done every 1-3 years. Abdominal aortic aneurysm (AAA) screening. You may need this if you are a current or former smoker. Osteoporosis. You may be screened starting at age 32 if you are at high risk. Talk with your health care provider  about your test results, treatment options, and if necessary, the need for more tests. Vaccines  Your health care provider may recommend certain vaccines, such as: Influenza vaccine. This is recommended every year. Tetanus, diphtheria, and acellular  pertussis (Tdap, Td) vaccine. You may need a Td booster every 10 years. Zoster vaccine. You may need this after age 31. Pneumococcal 13-valent conjugate (PCV13) vaccine. One dose is recommended after age 54. Pneumococcal polysaccharide (PPSV23) vaccine. One dose is recommended after age 63. Talk to your health care provider about which screenings and vaccines you need and how often you need them. This information is not intended to replace advice given to you by your health care provider. Make sure you discuss any questions you have with your health care provider. Document Released: 02/22/2015 Document Revised: 10/16/2015 Document Reviewed: 11/27/2014 Elsevier Interactive Patient Education  2017 [employer].  Fall Prevention in the Home Falls can cause injuries. They can happen to people of all ages. There are many things you can do to make your home safe and to help prevent falls. What can I do on the outside of my home? Regularly fix the edges of walkways and driveways and fix any cracks. Remove anything that might make you trip as you walk through a door, such as a raised step or threshold. Trim any bushes or trees on the path to your home. Use bright outdoor lighting. Clear any walking paths of anything that might make someone trip, such as rocks or tools. Regularly check to see if handrails are loose or broken. Make sure that both sides of any steps have handrails. Any raised decks and porches should have guardrails on the edges. Have any leaves, snow, or ice cleared regularly. Use sand or salt on walking paths during winter. Clean up any spills in your garage right away. This includes oil or grease spills. What can I do in the bathroom? Use night lights. Install grab bars by the toilet and in the tub and shower. Do not use towel bars as grab bars. Use non-skid mats or decals in the tub or shower. If you need to sit down in the shower, use a plastic, non-slip stool. Keep the floor  dry. Clean up any water that spills on the floor as soon as it happens. Remove soap buildup in the tub or shower regularly. Attach bath mats securely with double-sided non-slip rug tape. Do not have throw rugs and other things on the floor that can make you trip. What can I do in the bedroom? Use night lights. Make sure that you have a light by your bed that is easy to reach. Do not use any sheets or blankets that are too big for your bed. They should not hang down onto the floor. Have a firm chair that has side arms. You can use this for support while you get dressed. Do not have throw rugs and other things on the floor that can make you trip. What can I do in the kitchen? Clean up any spills right away. Avoid walking on wet floors. Keep items that you use a lot in easy-to-reach places. If you need to reach something above you, use a strong step stool that has a grab bar. Keep electrical cords out of the way. Do not use floor polish or wax that makes floors slippery. If you must use wax, use non-skid floor wax. Do not have throw rugs and other things on the floor that can make you trip. What can I do  with my stairs? Do not leave any items on the stairs. Make sure that there are handrails on both sides of the stairs and use them. Fix handrails that are broken or loose. Make sure that handrails are as long as the stairways. Check any carpeting to make sure that it is firmly attached to the stairs. Fix any carpet that is loose or worn. Avoid having throw rugs at the top or bottom of the stairs. If you do have throw rugs, attach them to the floor with carpet tape. Make sure that you have a light switch at the top of the stairs and the bottom of the stairs. If you do not have them, ask someone to add them for you. What else can I do to help prevent falls? Wear shoes that: Do not have high heels. Have rubber bottoms. Are comfortable and fit you well. Are closed at the toe. Do not wear  sandals. If you use a stepladder: Make sure that it is fully opened. Do not climb a closed stepladder. Make sure that both sides of the stepladder are locked into place. Ask someone to hold it for you, if possible. Clearly mark and make sure that you can see: Any grab bars or handrails. First and last steps. Where the edge of each step is. Use tools that help you move around (mobility aids) if they are needed. These include: Canes. Walkers. Scooters. Crutches. Turn on the lights when you go into a dark area. Replace any light bulbs as soon as they burn out. Set up your furniture so you have a clear path. Avoid moving your furniture around. If any of your floors are uneven, fix them. If there are any pets around you, be aware of where they are. Review your medicines with your doctor. Some medicines can make you feel dizzy. This can increase your chance of falling. Ask your doctor what other things that you can do to help prevent falls. This information is not intended to replace advice given to you by your health care provider. Make sure you discuss any questions you have with your health care provider. Document Released: 11/22/2008 Document Revised: 07/04/2015 Document Reviewed: 03/02/2014 Elsevier Interactive Patient Education  2017 [employer].

## 2022-09-23 NOTE — Assessment & Plan Note (Signed)
Continue f/u with Oncology.

## 2022-09-23 NOTE — Assessment & Plan Note (Signed)
Blood pressure is well controlled, continue current medications. Will check eGFR

## 2022-09-24 LAB — BASIC METABOLIC PANEL
BUN/Creatinine Ratio: 10 (ref 10–24)
BUN: 13 mg/dL (ref 8–27)
CO2: 24 mmol/L (ref 20–29)
Calcium: 9.8 mg/dL (ref 8.6–10.2)
Chloride: 102 mmol/L (ref 96–106)
Creatinine, Ser: 1.32 mg/dL — ABNORMAL HIGH (ref 0.76–1.27)
Glucose: 95 mg/dL (ref 70–99)
Potassium: 4.7 mmol/L (ref 3.5–5.2)
Sodium: 141 mmol/L (ref 134–144)
eGFR: 55 mL/min/{1.73_m2} — ABNORMAL LOW (ref >59)

## 2022-09-24 LAB — CBC
Hematocrit: 48.5 % (ref 37.5–51.0)
Hemoglobin: 15.7 g/dL (ref 13.0–17.7)
MCH: 27.4 pg (ref 26.6–33.0)
MCHC: 32.4 g/dL (ref 31.5–35.7)
MCV: 85 fL (ref 79–97)
Platelets: 194 10*3/uL (ref 150–450)
RBC: 5.72 x10E6/uL (ref 4.14–5.80)
RDW: 15.3 % (ref 11.6–15.4)
WBC: 5.9 10*3/uL (ref 3.4–10.8)

## 2022-09-24 LAB — LIPID PANEL
Chol/HDL Ratio: 4.3 ratio (ref 0.0–5.0)
Cholesterol, Total: 129 mg/dL (ref 100–199)
HDL: 30 mg/dL — ABNORMAL LOW (ref >39)
LDL Chol Calc (NIH): 72 mg/dL (ref 0–99)
Triglycerides: 152 mg/dL — ABNORMAL HIGH (ref 0–149)
VLDL Cholesterol Cal: 27 mg/dL (ref 5–40)

## 2022-10-19 ENCOUNTER — Telehealth: Payer: Self-pay | Admitting: Cardiovascular Disease

## 2022-10-19 DIAGNOSIS — I10 Essential (primary) hypertension: Secondary | ICD-10-CM

## 2022-10-19 DIAGNOSIS — Z024 Encounter for examination for driving license: Secondary | ICD-10-CM

## 2022-10-19 NOTE — Telephone Encounter (Signed)
Gaston Islam., NP  Lars Mage, RN3 hours ago (12:54 PM)    Please order Echo for DOT physical and make sure patient keeps follow up as scheduled. Thanks Sharalyn Ink, Anson Fret, RN  Louanne Skye Devoria Albe., NP4 hours ago (12:36 PM)    Needs ECHO for DOT physical

## 2022-10-19 NOTE — Telephone Encounter (Signed)
Echo has been ordered.   Left message for the patient informing him that we have placed an order for his echo and one of the schedulers will be in touch with him sometime this week. Advised the patient to contact the office with any questions or concerns.

## 2022-10-19 NOTE — Telephone Encounter (Signed)
Pt states that his employer is requesting that he has an Echo done. Pt would like a callback regarding whether he can wait until 1 yr f/u or have done sooner. Please advise

## 2022-10-19 NOTE — Telephone Encounter (Signed)
Hasn't seen Nahser since 2022 and I can't get him in until the 30th with him. I hate for him to have to wait that long for the visit to order the ECHO, then the additional 2-4 weeks for the actual ECHO. 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 Routing to Alden Server to advise on ECHO order who last saw pt 01/13/22 AND is scheduled to see him again 01/19/23.

## 2022-10-20 ENCOUNTER — Telehealth: Payer: Self-pay | Admitting: Nurse Practitioner

## 2022-10-20 ENCOUNTER — Ambulatory Visit (HOSPITAL_COMMUNITY): Payer: Medicare Other | Attending: Nurse Practitioner

## 2022-10-20 DIAGNOSIS — Z024 Encounter for examination for driving license: Secondary | ICD-10-CM | POA: Insufficient documentation

## 2022-10-20 DIAGNOSIS — I10 Essential (primary) hypertension: Secondary | ICD-10-CM | POA: Diagnosis not present

## 2022-10-20 LAB — ECHOCARDIOGRAM COMPLETE
Area-P 1/2: 3.23 cm2
S' Lateral: 2.4 cm

## 2022-10-20 NOTE — Telephone Encounter (Signed)
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 Please call pt if additional tests are needed and when complete.

## 2022-10-20 NOTE — Telephone Encounter (Signed)
Will be in providers box by eod of tomorrow.

## 2022-10-21 ENCOUNTER — Encounter: Payer: Self-pay | Admitting: *Deleted

## 2022-10-21 ENCOUNTER — Other Ambulatory Visit: Payer: Self-pay

## 2022-10-21 ENCOUNTER — Encounter: Payer: Self-pay | Admitting: Nurse Practitioner

## 2022-10-21 DIAGNOSIS — I10 Essential (primary) hypertension: Secondary | ICD-10-CM

## 2022-10-21 DIAGNOSIS — E782 Mixed hyperlipidemia: Secondary | ICD-10-CM

## 2022-10-21 DIAGNOSIS — Z024 Encounter for examination for driving license: Secondary | ICD-10-CM

## 2022-10-21 NOTE — Telephone Encounter (Signed)
Orders placed. Attestation pended and sent.

## 2022-10-21 NOTE — Telephone Encounter (Signed)
Pt calling back to try and set up a exercise stress treadmill test. Please advise

## 2022-10-21 NOTE — Telephone Encounter (Signed)
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 He voiced understanding.

## 2022-10-21 NOTE — Telephone Encounter (Signed)
Patrick Woodward has completed attestation orders for the patient.

## 2022-10-21 NOTE — Progress Notes (Signed)
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 Faxed to [facility] [phone] and copy mailed to patient home.

## 2022-10-21 NOTE — Telephone Encounter (Signed)
Contacted the patient to go over labs and reviewed ETT instructions letter. Patient agreeable and voiced understanding. Advised the pateint that we will complete forms once all testing has been completed. He voiced understanding.

## 2022-10-29 ENCOUNTER — Ambulatory Visit: Payer: Medicare Other | Attending: Nurse Practitioner

## 2022-10-29 DIAGNOSIS — I10 Essential (primary) hypertension: Secondary | ICD-10-CM

## 2022-10-29 DIAGNOSIS — E782 Mixed hyperlipidemia: Secondary | ICD-10-CM

## 2022-10-29 DIAGNOSIS — Z024 Encounter for examination for driving license: Secondary | ICD-10-CM

## 2022-10-29 LAB — EXERCISE TOLERANCE TEST
Angina Index: 0
Estimated workload: 3.4
Exercise duration (min): 5 min
Exercise duration (sec): 1 s
MPHR: 141 {beats}/min
Peak HR: 162 {beats}/min
Percent HR: 115 %
RPE: 15
Rest HR: 114 {beats}/min

## 2022-11-02 ENCOUNTER — Telehealth: Payer: Self-pay

## 2022-11-02 NOTE — Telephone Encounter (Signed)
The patient has been notified of the result and verbalized understanding.  All questions (if any) were answered. Ethelda Chick, RN 11/02/2022 9:11 AM

## 2022-11-02 NOTE — Telephone Encounter (Signed)
-----   Message from [employer], Leodis Rains sent at 10/29/2022  7:33 PM EDT ----- Please let patient know that his exercise stress test showed moderately impaired exercise capacity with resting sinus tachycardia and upsloping ST depression in leads III, aVF, V4, V5 and V6.  Plan deemed adequate and was negative for ischemia.  Plan: Please continue current medications with statin and aspirin therapy   Robin Searing, NP

## 2022-11-03 NOTE — Telephone Encounter (Signed)
Patient called to follow-up on the status of his paperwork and wants to know when he can pick it up.

## 2022-11-03 NOTE — Telephone Encounter (Signed)
Paperwork left in envelope and placed at the front desk. Attempted to call patient let him know but no answer. Lvm

## 2022-11-03 NOTE — Telephone Encounter (Signed)
Patient returned staff call and notified "Paperwork left in envelope and placed at the front desk".

## 2022-11-09 ENCOUNTER — Other Ambulatory Visit: Payer: Self-pay

## 2022-11-09 DIAGNOSIS — E782 Mixed hyperlipidemia: Secondary | ICD-10-CM

## 2022-11-09 MED ORDER — ATORVASTATIN CALCIUM 20 MG PO TABS: 20.0000 mg | ORAL_TABLET | Freq: Every day | ORAL | 0 refills | Status: DC

## 2022-11-16 ENCOUNTER — Telehealth: Payer: Self-pay | Admitting: *Deleted

## 2022-11-16 DIAGNOSIS — C189 Malignant neoplasm of colon, unspecified: Secondary | ICD-10-CM

## 2022-11-16 NOTE — Telephone Encounter (Signed)
Patrick Woodward needs Dr. Truett Perna to refer him to neurology due to his note of mild neuropathy. DOT wants him evaluated by December. Informed him a referral will be sent to [facility].

## 2022-11-17 NOTE — Progress Notes (Unsigned)
Initial neurology clinic note  Reason for Evaluation: Consultation requested by Ladene Artist, MD for an opinion regarding neuropathy. My final recommendations will be communicated back to the requesting physician by way of shared medical record or letter to requesting physician via [place] mail.  HPI: This is [name], a 82 y.o. right-handed male with a medical history of colon cancer s/p FOLFOX chemotherapy, HTN, HLD, GERD who presents to neurology clinic with the chief complaint of neuropathy. The patient is alone today.  Patient's symptoms started after chemotherapy for colon cancer. He noticed numbness in hands and feet. He thinks he does not currently have symptoms. 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 He denies any difficulties driving, so difficulty feeling the pedal, no accidents. His employer requires my evaluation as his oncologist listed neuropathy as a problem.  Patient has never been given medication for neuropathy. He denies any pain. He denies numbness, tingling, burning, and no weakness.  The patient does not report symptoms referable to autonomic dysfunction including impaired sweating, heat or cold intolerance, excessive mucosal dryness, gastroparetic early satiety, postprandial abdominal bloating, constipation, bowel or bladder dyscontrol, or syncope/presyncope/orthostatic intolerance.  He does not report any constitutional symptoms like fever, night sweats, anorexia or unintentional weight loss.  EtOH use: None  Restrictive diet? No Family history of neuropathy/myopathy/neurologic disease? No  Patient had an EMG by Dr. Terrace Arabia at [facility] 2013 that showed sensory predominant neuropathy.  Cancer history per Dr. 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  Status post cycle #1 adjuvant FOLFOX chemotherapy 12/01/2010. He completed cycle 2 beginning 12/15/2010. He completed cycle 3 beginning 01/05/2011 with Neulasta support. He completed cycle 11 on 05/04/2011. He completed the 12th and final cycle on 05/18/2011.   Negative colonoscopy 09/08/2011, benign polypoid lesion in the descending colon. Colonoscopy 08/28/2013 with multiple benign polyps removed. Restaging CT scans 10/26/2012 with no evidence of metastatic disease. Restaging CT scans 06/24/2013 with no evidence of recurrent disease Surveillance colonoscopy 08/17/2016- 16 mm polyp in the cecum; 10 mm polyp in the ascending colon; 2 mm polyp in the ascending colon; 5 mm polyp in the transverse colon.  Pathology on the cecal polyp-tubular adenoma, 1.6 cm; pathology on polyps descending/transverse colon-tubular adenomas. Mildly elevated CEA 12/07/2016, stable mild elevation 01/07/2017, improved but continued mild elevation 02/23/2017; CT scans recommended, patient declined due to renal dysfunction.  Chronic mild elevation of the CEA History of a bowel obstruction secondary to the primary colon mass. History of abdominal pain/constipation secondary to the descending/sigmoid colon mass. History of weight loss. Improved. Status post Port-A-Cath placement 11/27/2010. Removed 12/02/2011. History of neutropenia secondary to chemotherapy. Cycle 3 FOLFOX was held 12/29/2010 due to neutropenia. He received Neulasta with cycle #3 FOLFOX chemotherapy on 01/05/2011. He did not receive Neulasta following cycle 4. Cycle 5 was held for one week due to neutropenia. He received Neulasta with subsequent treatments. Neulasta was held beginning with cycle 10 since the oxaliplatin was discontinued. Thrombocytopenia secondary to chemotherapy. Oxaliplatin was dose reduced beginning with cycle 7. Back pain following cycle 3 FOLFOX. Likely related to Neulasta. 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 Improved.   Delayed nausea following cycle 7 of FOLFOX. Aloxi was added to the antiemetic regimen beginning with cycle 8 FOLFOX. He also began prophylactic Decadron on days 2  and 3 following chemotherapy with cycle 8. He did not have nausea following cycle 8 or cycle 9 FOLFOX Colostomy reversal 12/02/2011. Mild elevation of the creatinine 10/26/2012. Repeat value on 10/28/2012 stable at 1.5. He is followed by nephrology   MEDICATIONS:  Outpatient Encounter Medications as of 11/19/2022  Medication Sig   aspirin 81 MG tablet Take 1 tablet (81 mg total) by mouth daily.   atorvastatin (LIPITOR) 20 MG tablet Take 1 tablet (20 mg total) by mouth daily.   cholecalciferol (VITAMIN D) 1000 UNITS tablet Take 2,000 Units by mouth daily.   metoprolol succinate (TOPROL-XL) 25 MG 24 hr tablet Take 1 tablet (25 mg total) by mouth daily.   metoprolol succinate (TOPROL-XL) 50 MG 24 hr tablet TAKE 1 TABLET BY MOUTH ONCE DAILY IN AM WITH 25 MG TABLET (TOTAL 75 MG)   Multiple Vitamin (MULTIVITAMIN PO) Take 1 tablet by mouth daily.   sildenafil (VIAGRA) 50 MG tablet Take 1 tablet (50 mg total) by mouth daily as needed for erectile dysfunction.   vitamin E 180 MG (400 UNITS) capsule Take 400 Units by mouth daily.   No facility-administered encounter medications on file as of 11/19/2022.    PAST MEDICAL HISTORY: 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    PAST SURGICAL HISTORY: Past Surgical History:  Procedure Laterality Date   COLON SURGERY  10/20/10   colon resection w/colostomy/Hartmann pouch procedure   COLONOSCOPY     COLONOSCOPY N/A 08/28/2013   Procedure: COLONOSCOPY;  Surgeon: Shirley Friar, MD;   Location: [facility];  Service: Endoscopy;  Laterality: N/A;   COLOSTOMY CLOSURE  12/02/2011   COLOSTOMY CLOSURE  12/02/2011   Procedure: COLOSTOMY CLOSURE;  Surgeon: Clovis Pu. Cornett, MD;  Location: [facility] OR;  Service: General;  Laterality: N/A;   HOT HEMOSTASIS N/A 08/28/2013   Procedure: HOT HEMOSTASIS (ARGON PLASMA COAGULATION/BICAP);  Surgeon: Shirley Friar, MD;  Location: [facility];  Service: Endoscopy;  Laterality: N/A;   PORT-A-CATH REMOVAL  12/02/2011   PORT-A-CATH REMOVAL  12/02/2011   Procedure: REMOVAL PORT-A-CATH;  Surgeon: Clovis Pu. Cornett, MD;  Location: [facility] OR;  Service: General;  Laterality: Left;   PORTACATH PLACEMENT  11/27/10   Dr. Red Christians chest    ALLERGIES: Allergies  Allergen Reactions   Procardia [Nifedipine] Other (See Comments)    irratic heart rate     FAMILY HISTORY: Family History  Problem Relation Age of Onset   Cancer Mother        colon   Cancer Father        prostate    SOCIAL HISTORY: Social History   Tobacco Use   Smoking status: Former    Current packs/day: 1.00    Average packs/day: 1 pack/day for 20.0 years (20.0 ttl pk-yrs)    Types: Cigarettes, Pipe, Cigars   Smokeless tobacco: Never   Tobacco comments:    12/02/2011 "stopped smoking ~ 30 yr ago"  Vaping Use   Vaping status: Never Used  Substance Use Topics   Alcohol use: No   Drug use: No   Social History   Social History Narrative   Are you right handed or left handed? Right   Are you currently employed ?  What is your current occupation? [employer]   Do you live at home alone?   Who lives with you? friend    What type of home do you live in: 1 story or 2 story? one    Caffeine 1 cup daily at times      OBJECTIVE: PHYSICAL EXAM: BP (!) 157/80   Pulse 96   Ht 6\' 5"  (1.956 m)   Wt 203 lb (92.1 kg)   SpO2 96%   BMI 24.07 kg/m   General: General appearance: Awake and alert. No distress. Cooperative with exam.  Skin: No obvious rash or  jaundice. HEENT: Atraumatic. Anicteric. Lungs: Non-labored breathing on room air  Extremities: No edema.  Psych: Affect appropriate.  Neurological: Mental Status: Alert. Speech fluent. No pseudobulbar affect Cranial Nerves: CNII: No RAPD. Visual fields grossly intact. CNIII, IV, VI: PERRL. No nystagmus. EOMI. CN V: Facial sensation intact bilaterally to fine touch. CN VII: Facial muscles symmetric and strong. No ptosis at rest. CN VIII: Hearing grossly intact bilaterally. CN IX: No hypophonia. CN X: Palate elevates symmetrically. CN XI: Full strength shoulder shrug bilaterally. CN XII: Tongue protrusion full and midline. No atrophy or fasciculations. No significant dysarthria Motor: Tone is normal. Strength is 5/5 in bilateral upper and lower extremities. Reflexes:  Right Left   Bicep 2+ 2+   Tricep 2+ 2+   BrRad 2+ 2+   Knee 2+ 2+   Ankle Trace Trace Difficulty relaxing ankles   Pathological Reflexes: Babinski: flexor response bilaterally Hoffman: absent bilaterally Troemner: absent bilaterally Sensation: Pinprick: Intact in all extremities Vibration: Intact in all extremities Proprioception: intact in bilateral great toes Coordination: Intact finger-to- nose-finger bilaterally. Gait: Able to rise from chair with arms crossed unassisted. Narrow-based gait.  Lab and Test Review: Internal labs: 09/23/22: CBC unremarkable BMP significant for Cr of 1.32 Lipid panel: Component     Latest Ref Rng 09/23/2022  Cholesterol, Total     100 - 199 mg/dL 865   Triglycerides     0 - 149 mg/dL 784 (H)   HDL Cholesterol     >39 mg/dL 30 (L)   VLDL Cholesterol Cal     5 - 40 mg/dL 27   LDL Chol Calc (NIH)     0 - 99 mg/dL 72   Total CHOL/HDL Ratio     0.0 - 5.0 ratio 4.3    Imaging: Lumbar spine xray (03/25/21): FINDINGS: Five lumbar type vertebra. There is no acute fracture or subluxation of the lumbar spine. Degenerative changes with spurring and osteophyte most  prominent at L2-L3. The visualized posterior elements are intact. Lower lumbar facet arthropathy. The soft tissues are unremarkable.   IMPRESSION: 1. No acute/traumatic lumbar spine pathology. 2. Degenerative changes.  EMG (07/14/11 - by Dr. Terrace Arabia at [facility]):    ASSESSMENT: Patrick Woodward is a 82 y.o. male who presents for evaluation of previously diagnosed neuropathy and clearance for job as bus driver. He has a relevant medical history of colon cancer s/p FOLFOX chemotherapy, HTN, HLD, GERD. His neurological examination is essentially normal today. Available diagnostic data is significant for EMG in 2013 showing a sensory predominant neuropathy. Patient currently has no symptoms and his examination does not show clear evidence of neuropathy. Certainly his previous exposure to oxaliplatin puts patient at risk for neuropathy, but I do not see clear deficits today.  PLAN: -No further neurologic work up needed. 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  -Fall precautions discussed and given to patient.  -Return to clinic as needed  The impression above as well as the plan as outlined below were extensively discussed with the patient who voiced understanding. All questions were answered to their satisfaction.  When available, results of the above investigations and possible further recommendations will be communicated to the patient via telephone/MyChart. Patient to call office if not contacted after expected testing turnaround time.   Total time spent reviewing records, interview, history/exam, documentation, and coordination of care on day of encounter:  45 min   Thank you for allowing me to participate in patient's care.  If I can answer any additional questions, I would be pleased to do so.  Jacquelyne Balint, MD   CC: Arnette Felts, FNP [address] [place] [address]  CC: Referring provider: Ladene Artist,  MD [address] [place],  [place] [address]

## 2022-11-19 ENCOUNTER — Encounter: Payer: Self-pay | Admitting: Neurology

## 2022-11-19 ENCOUNTER — Ambulatory Visit (INDEPENDENT_AMBULATORY_CARE_PROVIDER_SITE_OTHER): Payer: Medicare Other | Admitting: Neurology

## 2022-11-19 VITALS — BP 128/78 | HR 96 | Ht 77.0 in | Wt 203.0 lb

## 2022-11-19 DIAGNOSIS — G620 Drug-induced polyneuropathy: Secondary | ICD-10-CM

## 2022-11-19 DIAGNOSIS — Z85038 Personal history of other malignant neoplasm of large intestine: Secondary | ICD-10-CM | POA: Diagnosis not present

## 2022-11-19 DIAGNOSIS — T451X5A Adverse effect of antineoplastic and immunosuppressive drugs, initial encounter: Secondary | ICD-10-CM

## 2022-11-19 NOTE — Patient Instructions (Signed)
I saw you today due to your history of neuropathy. You do not describe symptoms of neuropathy (numbness, tingling, or weakness) and I do not see clear signs of neuropathy on your examination today.    Follow up with me as needed.  The physicians and staff at [facility] are committed to providing excellent care. You may receive a survey requesting feedback about your experience at our office. We strive to receive "very good" responses to the survey questions. If you feel that your experience would prevent you from giving the office a "very good " response, please contact our office to try to remedy the situation. We may be reached at [phone]. Thank you for taking the time out of your busy day to complete the survey.  Jacquelyne Balint, MD [facility]  Preventing Falls at [facility] are common, often dreaded events in the lives of older people. Aside from the obvious injuries and even death that may result, fall can cause wide-ranging consequences including loss of independence, mental decline, decreased activity and mobility. Younger people are also at risk of falling, especially those with chronic illnesses and fatigue.  Ways to reduce risk for falling Examine diet and medications. Warm foods and alcohol dilate blood vessels, which can lead to dizziness when standing. Sleep aids, antidepressants and pain medications can also increase the likelihood of a fall.  Get a vision exam. Poor vision, cataracts and glaucoma increase the chances of falling.  Check foot gear. Shoes should fit snugly and have a sturdy, nonskid sole and a broad, low heel  Participate in a physician-approved exercise program to build and maintain muscle strength and improve balance and coordination. Programs that use ankle weights or stretch bands are excellent for muscle-strengthening. Water aerobics programs and low-impact Tai Chi programs have also been shown to improve balance and coordination.  Increase  vitamin D intake. Vitamin D improves muscle strength and increases the amount of calcium the body is able to absorb and deposit in bones.  How to prevent falls from common hazards Floors - Remove all loose wires, cords, and throw rugs. Minimize clutter. Make sure rugs are anchored and smooth. Keep furniture in its usual place.  Chairs -- Use chairs with straight backs, armrests and firm seats. Add firm cushions to existing pieces to add height.  Bathroom - Install grab bars and non-skid tape in the tub or shower. Use a bathtub transfer bench or a shower chair with a back support Use an elevated toilet seat and/or safety rails to assist standing from a low surface. Do not use towel racks or bathroom tissue holders to help you stand.  Lighting - Make sure halls, stairways, and entrances are well-lit. Install a night light in your bathroom or hallway. Make sure there is a light switch at the top and bottom of the staircase. Turn lights on if you get up in the middle of the night. Make sure lamps or light switches are within reach of the bed if you have to get up during the night.  Kitchen - Install non-skid rubber mats near the sink and stove. Clean spills immediately. Store frequently used utensils, pots, pans between waist and eye level. This helps prevent reaching and bending. Sit when getting things out of lower cupboards.  Living room/ Bedrooms - Place furniture with wide spaces in between, giving enough room to move around. Establish a route through the living room that gives you something to hold onto as you walk.  Stairs - Make sure  treads, rails, and rugs are secure. Install a rail on both sides of the stairs. If stairs are a threat, it might be helpful to arrange most of your activities on the lower level to reduce the number of times you must climb the stairs.  Entrances and doorways - Install metal handles on the walls adjacent to the doorknobs of all doors to make it more secure as you  travel through the doorway.  Tips for maintaining balance Keep at least one hand free at all times. Try using a backpack or fanny pack to hold things rather than carrying them in your hands. Never carry objects in both hands when walking as this interferes with keeping your balance.  Attempt to swing both arms from front to back while walking. This might require a conscious effort if Parkinson's disease has diminished your movement. It will, however, help you to maintain balance and posture, and reduce fatigue.  Consciously lift your feet off of the ground when walking. Shuffling and dragging of the feet is a common culprit in losing your balance.  When trying to navigate turns, use a "U" technique of facing forward and making a wide turn, rather than pivoting sharply.  Try to stand with your feet shoulder-length apart. When your feet are close together for any length of time, you increase your risk of losing your balance and falling.  Do one thing at a time. Don't try to walk and accomplish another task, such as reading or looking around. The decrease in your automatic reflexes complicates motor function, so the less distraction, the better.  Do not wear rubber or gripping soled shoes, they might "catch" on the floor and cause tripping.  Move slowly when changing positions. Use deliberate, concentrated movements and, if needed, use a grab bar or walking aid. Count 15 seconds between each movement. For example, when rising from a seated position, wait 15 seconds after standing to begin walking.  If balance is a continuous problem, you might want to consider a walking aid such as a cane, walking stick, or walker. Once you've mastered walking with help, you might be ready to try it on your own again.

## 2022-12-23 ENCOUNTER — Telehealth: Payer: Self-pay | Admitting: *Deleted

## 2022-12-23 NOTE — Telephone Encounter (Signed)
error 

## 2023-01-04 DIAGNOSIS — H2513 Age-related nuclear cataract, bilateral: Secondary | ICD-10-CM | POA: Diagnosis not present

## 2023-01-05 NOTE — Progress Notes (Deleted)
Madelaine Bhat, CMA,acting as a Neurosurgeon for Arnette Felts, FNP.,have documented all relevant documentation on the behalf of Arnette Felts, FNP,as directed by  Arnette Felts, FNP while in the presence of Arnette Felts, FNP.  Subjective:  Patient ID: Patrick Woodward , male    DOB: 12/11/1940 , 82 y.o.   MRN: [id number]  No chief complaint on file.   HPI  Patient presents today for a bp and chol follow up, Patient reports compliance with medication. Patient denies any chest pain, SOB, or headaches. Patient has no concerns today.     Past Medical History:  Diagnosis Date  . Colon cancer (HCC) 10/11/2010   Stage III-mod differentiated adeno  . GERD (gastroesophageal reflux disease)   . Hypercholesteremia   . Hypertension   . Malignant neoplasm of colon, unspecified part of colon (HCC) 09/23/2022  . 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 Cancer Mother        colon  . Cancer Father        prostate     Current Outpatient Medications:  .  aspirin 81 MG tablet, Take 1 tablet (81 mg total) by mouth daily., Disp: 100 tablet, Rfl: 12 .  atorvastatin (LIPITOR) 20 MG tablet, Take 1 tablet (20 mg total) by mouth daily., Disp: 90 tablet, Rfl: 0 .  cholecalciferol (VITAMIN D) 1000 UNITS tablet, Take 2,000 Units by mouth daily., Disp: , Rfl:  .  metoprolol succinate (TOPROL-XL) 25 MG 24 hr tablet, Take 1 tablet (25 mg total) by mouth daily., Disp: 90 tablet, Rfl: 0 .  metoprolol succinate (TOPROL-XL) 50 MG 24 hr tablet, TAKE 1 TABLET BY MOUTH ONCE DAILY IN AM WITH 25 MG TABLET (TOTAL 75 MG), Disp: 90 tablet, Rfl: 1 .  Multiple Vitamin (MULTIVITAMIN PO), Take 1 tablet by mouth daily., Disp: , Rfl:  .  sildenafil (VIAGRA) 50 MG tablet, Take 1 tablet (50 mg total) by mouth daily as needed for erectile dysfunction., Disp: 30 tablet, Rfl: 2 .  vitamin E 180 MG (400 UNITS) capsule, Take 400 Units by mouth daily., Disp: , Rfl:     Allergies  Allergen Reactions  . Procardia [Nifedipine] Other (See Comments)    irratic heart rate      Review of Systems   There were no vitals filed for this visit. There is no height or weight on file to calculate BMI.  Wt Readings from Last 3 Encounters:  11/19/22 203 lb (92.1 kg)  09/23/22 199 lb (90.3 kg)  09/23/22 199 lb 9.6 oz (90.5 kg)    The ASCVD Risk score (Arnett DK, et al., 2019) failed to calculate for the following reasons:   The 2019 ASCVD risk score is only valid for ages 5 to 80  Objective:  Physical Exam      Assessment And Plan:  Essential hypertension  Mixed hyperlipidemia    No follow-ups on file.  Patient was given opportunity to ask questions. Patient verbalized understanding of the plan and was able to repeat key elements of the plan. All questions were answered to their satisfaction.    Jeanell Sparrow, FNP, have reviewed all documentation for this visit. The documentation on 01/05/23 for the exam, diagnosis, procedures, and orders are all accurate and complete.   IF YOU HAVE BEEN REFERRED TO A SPECIALIST, IT MAY TAKE 1-2 WEEKS TO SCHEDULE/PROCESS THE REFERRAL. IF YOU HAVE NOT HEARD FROM US/SPECIALIST  IN TWO WEEKS, PLEASE GIVE [place] A CALL AT [phone].

## 2023-01-06 ENCOUNTER — Ambulatory Visit: Payer: Medicare Other | Admitting: Nurse Practitioner

## 2023-01-06 DIAGNOSIS — E782 Mixed hyperlipidemia: Secondary | ICD-10-CM

## 2023-01-06 DIAGNOSIS — I10 Essential (primary) hypertension: Secondary | ICD-10-CM

## 2023-01-19 ENCOUNTER — Ambulatory Visit: Payer: Medicare Other | Admitting: Nurse Practitioner

## 2023-01-21 ENCOUNTER — Inpatient Hospital Stay: Payer: Medicare Other

## 2023-01-21 ENCOUNTER — Inpatient Hospital Stay: Payer: Medicare Other | Attending: Nurse Practitioner | Admitting: Nurse Practitioner

## 2023-01-21 ENCOUNTER — Telehealth: Payer: Self-pay

## 2023-01-21 ENCOUNTER — Encounter: Payer: Self-pay | Admitting: Nurse Practitioner

## 2023-01-21 VITALS — BP 140/80 | HR 80 | Temp 98.1°F | Resp 18 | Ht 77.0 in | Wt 207.0 lb

## 2023-01-21 DIAGNOSIS — C189 Malignant neoplasm of colon, unspecified: Secondary | ICD-10-CM | POA: Diagnosis not present

## 2023-01-21 DIAGNOSIS — Z08 Encounter for follow-up examination after completed treatment for malignant neoplasm: Secondary | ICD-10-CM | POA: Diagnosis not present

## 2023-01-21 DIAGNOSIS — Z85038 Personal history of other malignant neoplasm of large intestine: Secondary | ICD-10-CM | POA: Insufficient documentation

## 2023-01-21 LAB — CEA (ACCESS): CEA (CHCC): 6 ng/mL — ABNORMAL HIGH (ref 0.00–5.00)

## 2023-01-21 NOTE — Telephone Encounter (Signed)
-----   Message from Lonna Cobb sent at 01/21/2023  3:02 PM EST ----- Please let him know CEA is mildly elevated, stable as compared to last year.  Follow-up as scheduled.

## 2023-01-21 NOTE — Progress Notes (Signed)
[facility] OFFICE PROGRESS NOTE   Diagnosis: Colon cancer  INTERVAL HISTORY:   [name] returns as scheduled.  He feels well.  No change in bowel habits.  No bleeding.  No nausea or vomiting.  He has a good appetite.  He has not scheduled a colonoscopy due to financial constraints.  Objective:  Vital signs in last 24 hours:  Blood pressure (!) 140/80, pulse 80, temperature 98.1 F (36.7 C), temperature source Temporal, resp. rate 18, height 6\' 5"  (1.956 m), weight 207 lb (93.9 kg), SpO2 99%.    Lymphatics: No palpable cervical, supraclavicular, axillary or inguinal lymph nodes. Resp: Distant breath sounds.  No respiratory distress. Cardio: Regular rate and rhythm. GI: No hepatosplenomegaly. Vascular: No leg edema.   Lab Results:  Lab Results  Component Value Date   WBC 5.9 09/23/2022   HGB 15.7 09/23/2022   HCT 48.5 09/23/2022   MCV 85 09/23/2022   PLT 194 09/23/2022   NEUTROABS 2.7 10/25/2013    Imaging:  No results found.  Medications: I have reviewed the patient's current medications.  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 Status post cycle #1 adjuvant FOLFOX chemotherapy 12/01/2010. He completed cycle 2 beginning 12/15/2010. He completed cycle 3 beginning 01/05/2011 with Neulasta support. He completed cycle 11 on 05/04/2011. He completed the 12th and final cycle on 05/18/2011.   Negative colonoscopy 09/08/2011, benign polypoid lesion in the descending colon. Colonoscopy 08/28/2013 with multiple benign polyps removed. Restaging CT scans 10/26/2012 with no evidence of metastatic disease. Restaging CT scans 06/24/2013 with no evidence of recurrent disease Surveillance colonoscopy 08/17/2016- 16 mm polyp in the cecum; 10 mm polyp in the ascending colon; 2 mm polyp in the ascending colon; 5 mm polyp in  the transverse colon.  Pathology on the cecal polyp-tubular adenoma, 1.6 cm; pathology on polyps descending/transverse colon-tubular adenomas. Mildly elevated CEA 12/07/2016, stable mild elevation 01/07/2017, improved but continued mild elevation 02/23/2017; CT scans recommended, patient declined due to renal dysfunction.  Chronic mild elevation of the CEA History of a bowel obstruction secondary to the primary colon mass. History of abdominal pain/constipation secondary to the descending/sigmoid colon mass. History of weight loss. Improved. Status post Port-A-Cath placement 11/27/2010. Removed 12/02/2011. History of neutropenia secondary to chemotherapy. Cycle 3 FOLFOX was held 12/29/2010 due to neutropenia. He received Neulasta with cycle #3 FOLFOX chemotherapy on 01/05/2011. He did not receive Neulasta following cycle 4. Cycle 5 was held for one week due to neutropenia. He received Neulasta with subsequent treatments. Neulasta was held beginning with cycle 10 since the oxaliplatin was discontinued. Thrombocytopenia secondary to chemotherapy. Oxaliplatin was dose reduced beginning with cycle 7. Back pain following cycle 3 FOLFOX. Likely related to Neulasta. 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 Improved.   Delayed nausea following cycle 7 of FOLFOX. Aloxi was added to the antiemetic regimen beginning with cycle 8 FOLFOX. He also began prophylactic Decadron on days 2 and 3 following chemotherapy with cycle 8. He did not have nausea following cycle 8 or cycle 9 FOLFOX Colostomy reversal 12/02/2011. Mild elevation of the creatinine 10/26/2012. Repeat value on 10/28/2012 stable at 1.5. He is followed by nephrology  Disposition: Patrick Woodward remains in clinical remission from colon cancer.  We will follow-up on the CEA from today.  He is now greater than 12 years out from diagnosis.  He would like to continue annual  follow-up with the Cancer Center.  He  has not scheduled a surveillance colonoscopy due to financial constraints.  We will contact his gastroenterologist's office to see if there is any financial assistance available.  He will return for a CEA and follow-up visit in 1 year.  Lonna Cobb ANP/GNP-BC   01/21/2023  9:49 AM

## 2023-01-21 NOTE — Telephone Encounter (Signed)
The patient acknowledged understanding and indicated that he had no further questions or concerns. I communicated with Stacy regarding Dr. Marge Duncans services, and she confirmed that the co-payment for the colonoscopy will be $225.00. Additionally, she mentioned that they can arrange a monthly payment plan to accommodate the patient.

## 2023-01-22 ENCOUNTER — Ambulatory Visit: Payer: Medicare Other | Admitting: Nurse Practitioner

## 2023-01-25 NOTE — Progress Notes (Signed)
Madelaine Bhat, CMA,acting as a Neurosurgeon for Arnette Felts, FNP.,have documented all relevant documentation on the behalf of Arnette Felts, FNP,as directed by  Arnette Felts, FNP while in the presence of Arnette Felts, FNP.  Subjective:   Patient ID: Patrick Woodward , male    DOB: 08/07/1940 , 82 y.o.   MRN: [id number]  Chief Complaint  Patient presents with   Annual Exam    HPI  Patient presents today for HM, Patient reports compliance with medication. Patient denies any chest pain, SOB, or headaches. Patient has no concerns today. He has been to his Oncologist Dr. Truett Perna - no changes. He has also been to the eye doctor - has a prescription for new glasses He does not want to do the colonoscopy due to the cost is $200.      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     Family History  Problem Relation Age of Onset   Cancer Mother        colon   Cancer Father        prostate     Current Outpatient Medications:    aspirin 81 MG tablet, Take 1 tablet (81 mg total) by mouth daily., Disp: 100 tablet, Rfl: 12   cholecalciferol (VITAMIN D) 1000 UNITS tablet, Take 2,000 Units by mouth daily., Disp: , Rfl:    Multiple Vitamin (MULTIVITAMIN PO), Take 1 tablet by mouth daily., Disp: , Rfl:    vitamin E 180 MG (400 UNITS) capsule, Take 400 Units by mouth daily., Disp: , Rfl:    atorvastatin (LIPITOR) 20 MG tablet, Take 1 tablet (20 mg total) by mouth daily., Disp: 90 tablet, Rfl: 0   metoprolol succinate (TOPROL-XL) 25 MG 24 hr tablet, Take 1 tablet (25 mg total) by mouth daily., Disp: 90 tablet, Rfl: 1   metoprolol succinate (TOPROL-XL) 50 MG 24 hr tablet, TAKE 1 TABLET BY MOUTH ONCE DAILY IN AM WITH 25 MG  TABLET (TOTAL 75 MG), Disp: 90 tablet, Rfl: 1   sildenafil (VIAGRA) 50 MG tablet, Take 1 tablet (50 mg total) by mouth daily as needed for erectile dysfunction., Disp: 30 tablet, Rfl: 2   Allergies  Allergen Reactions   Procardia [Nifedipine] Other (See Comments)    irratic heart rate      Men's preventive visit. Patient Health Questionnaire (PHQ-2) is  Flowsheet Row Office Visit from 01/26/2023 in [facility]  PHQ-2 Total Score 0     Patient is on a Regular diet. Exercises intermittently. Marital status: Divorced. Relevant history for alcohol use is:  Social History   Substance and Sexual Activity  Alcohol Use No  . Relevant history for tobacco use is:  Social History   Tobacco Use  Smoking Status Former   Current packs/day: 0.00   Average packs/day: 1 pack/day for 20.0 years (20.0 ttl pk-yrs)   Types: Cigarettes, Pipe, Cigars   Quit date: 11/29/1978   Years since quitting: 44.2  Smokeless Tobacco Never  Tobacco Comments   Quite 40 years ago  .   Review of Systems  Constitutional: Negative.   HENT: Negative.    Eyes: Negative.   Respiratory: Negative.    Cardiovascular: Negative.  Negative for chest pain and palpitations.  Gastrointestinal: Negative.   Endocrine: Negative.   Genitourinary: Negative.   Musculoskeletal: Negative.   Skin: Negative.   Allergic/Immunologic: Negative.   Neurological: Negative.   Hematological: Negative.   Psychiatric/Behavioral: Negative.       Today's Vitals   01/26/23 1021  BP: 100/70  Pulse: 70  Temp: 98.5 F (36.9 C)  TempSrc: Oral  Weight: 205 lb (93 kg)  Height: 6\' 5"  (1.956 m)  PainSc: 0-No pain   Body mass index is 24.31 kg/m.  Wt Readings from Last 3 Encounters:  01/26/23 205 lb (93 kg)  01/21/23 207 lb (93.9 kg)  11/19/22 203 lb (92.1 kg)    Objective:  Physical Exam Vitals reviewed.  Constitutional:      General: He is not in acute distress.    Appearance: Normal  appearance.  HENT:     Head: Normocephalic and atraumatic.     Right Ear: Tympanic membrane, ear canal and external ear normal. There is no impacted cerumen.     Left Ear: Tympanic membrane, ear canal and external ear normal. There is no impacted cerumen.     Nose: Nose normal.     Mouth/Throat:     Mouth: Mucous membranes are moist.  Eyes:     Extraocular Movements: Extraocular movements intact.     Conjunctiva/sclera: Conjunctivae normal.     Pupils: Pupils are equal, round, and reactive to light.  Cardiovascular:     Rate and Rhythm: Normal rate and regular rhythm.     Pulses: Normal pulses.     Heart sounds: Normal heart sounds. No murmur heard. Pulmonary:     Effort: Pulmonary effort is normal. No respiratory distress.     Breath sounds: Normal breath sounds. No wheezing.  Abdominal:     General: Abdomen is flat. Bowel sounds are normal. There is no distension.     Palpations: Abdomen is soft.     Tenderness: There is no abdominal tenderness.  Genitourinary:    Comments: Deferred - followed by Urology Musculoskeletal:        General: No swelling, tenderness or deformity. Normal range of motion.     Cervical back: Normal range of motion and neck supple. No rigidity.  Skin:    General: Skin is warm and dry.     Capillary Refill: Capillary refill takes less than 2 seconds.     Comments: Healed surgical scar to mid and left lower abdomen.    Neurological:     General: No focal deficit present.     Mental Status: He is alert and oriented to person, place, and time.     Cranial Nerves: No cranial nerve deficit.     Motor: No weakness.  Psychiatric:        Mood and Affect: Mood normal.        Behavior: Behavior normal.        Thought Content: Thought content normal.        Judgment: Judgment normal.         Assessment And Plan:    Encounter for annual health examination Assessment & Plan: Behavior modifications discussed and diet history reviewed.   Pt will continue  to exercise regularly and modify diet with low GI, plant based foods and decrease intake of processed foods.  Recommend intake of daily multivitamin, Vitamin D, and calcium.  Recommend colonoscopy for preventive screenings, as well as recommend immunizations that include influenza, TDAP, and Shingles    Essential hypertension -  EKG 12-Lead -     POCT URINALYSIS DIP (CLINITEK) -     Microalbumin / creatinine urine ratio  Mixed hyperlipidemia Assessment & Plan: Cholesterol levels are stable, continue statin.   Orders: -     CMP14+EGFR -     Lipid panel -     Atorvastatin Calcium; Take 1 tablet (20 mg total) by mouth daily.  Dispense: 90 tablet; Refill: 0  Benign hypertension with chronic kidney disease, stage III (HCC) Assessment & Plan: Blood pressure is well controlled, continue current medications. Will check eGFR. EKG done NSR with HR 60  Orders: -     CMP14+EGFR -     Metoprolol Succinate ER; TAKE 1 TABLET BY MOUTH ONCE DAILY IN AM WITH 25 MG TABLET (TOTAL 75 MG)  Dispense: 90 tablet; Refill: 1 -     Metoprolol Succinate ER; Take 1 tablet (25 mg total) by mouth daily.  Dispense: 90 tablet; Refill: 1  Erectile dysfunction, unspecified erectile dysfunction type -     Sildenafil Citrate; Take 1 tablet (50 mg total) by mouth daily as needed for erectile dysfunction.  Dispense: 30 tablet; Refill: 2  Other long term (current) drug therapy -     CBC with Differential/Platelet -     TSH    Return for 1 year HM; 6 month f/u. Patient was given opportunity to ask questions. Patient verbalized understanding of the plan and was able to repeat key elements of the plan. All questions were answered to their satisfaction.   Arnette Felts, FNP  I, Arnette Felts, FNP, have reviewed all documentation for this visit. The documentation on 01/26/23 for the exam, diagnosis, procedures, and orders are all accurate and complete.

## 2023-01-26 ENCOUNTER — Encounter: Payer: Self-pay | Admitting: Nurse Practitioner

## 2023-01-26 ENCOUNTER — Ambulatory Visit (INDEPENDENT_AMBULATORY_CARE_PROVIDER_SITE_OTHER): Payer: Medicare Other | Admitting: Nurse Practitioner

## 2023-01-26 VITALS — BP 100/70 | HR 70 | Temp 98.5°F | Ht 77.0 in | Wt 205.0 lb

## 2023-01-26 DIAGNOSIS — I10 Essential (primary) hypertension: Secondary | ICD-10-CM

## 2023-01-26 DIAGNOSIS — E782 Mixed hyperlipidemia: Secondary | ICD-10-CM | POA: Diagnosis not present

## 2023-01-26 DIAGNOSIS — I129 Hypertensive chronic kidney disease with stage 1 through stage 4 chronic kidney disease, or unspecified chronic kidney disease: Secondary | ICD-10-CM

## 2023-01-26 DIAGNOSIS — Z79899 Other long term (current) drug therapy: Secondary | ICD-10-CM

## 2023-01-26 DIAGNOSIS — N529 Male erectile dysfunction, unspecified: Secondary | ICD-10-CM

## 2023-01-26 DIAGNOSIS — N183 Chronic kidney disease, stage 3 unspecified: Secondary | ICD-10-CM

## 2023-01-26 DIAGNOSIS — Z0000 Encounter for general adult medical examination without abnormal findings: Secondary | ICD-10-CM

## 2023-01-26 LAB — POCT URINALYSIS DIP (CLINITEK)
Bilirubin, UA: NEGATIVE
Blood, UA: NEGATIVE
Glucose, UA: NEGATIVE mg/dL
Ketones, POC UA: NEGATIVE mg/dL
Leukocytes, UA: NEGATIVE
Nitrite, UA: NEGATIVE
POC PROTEIN,UA: NEGATIVE
Spec Grav, UA: 1.015 (ref 1.010–1.025)
Urobilinogen, UA: 0.2 U/dL
pH, UA: 5.5 (ref 5.0–8.0)

## 2023-01-26 MED ORDER — ATORVASTATIN CALCIUM 20 MG PO TABS: 20.0000 mg | ORAL_TABLET | Freq: Every day | ORAL | 0 refills | Status: DC

## 2023-01-26 MED ORDER — SILDENAFIL CITRATE 50 MG PO TABS: 50.0000 mg | ORAL_TABLET | Freq: Every day | ORAL | 2 refills | Status: DC | PRN

## 2023-01-26 MED ORDER — METOPROLOL SUCCINATE ER 25 MG PO TB24: 25.0000 mg | ORAL_TABLET | Freq: Every day | ORAL | 1 refills | Status: DC

## 2023-01-26 MED ORDER — METOPROLOL SUCCINATE ER 50 MG PO TB24: ORAL_TABLET | ORAL | 1 refills | Status: DC

## 2023-01-26 NOTE — Patient Instructions (Signed)
Health Maintenance  Topic Date Due   COVID-19 Vaccine (6 - 2024-25 season) 02/11/2023*   Medicare Annual Wellness Visit  09/23/2023   DTaP/Tdap/Td vaccine (2 - Td or Tdap) 01/08/2031   Pneumonia Vaccine  Completed   Flu Shot  Completed   Zoster (Shingles) Vaccine  Completed   HPV Vaccine  Aged Out   Screening for Lung Cancer  Discontinued   Colon Cancer Screening  Discontinued   Hepatitis C Screening  Discontinued  *Topic was postponed. The date shown is not the original due date.

## 2023-01-27 LAB — CBC WITH DIFFERENTIAL/PLATELET
Basophils Absolute: 0 10*3/uL (ref 0.0–0.2)
Basos: 1 %
EOS (ABSOLUTE): 0.1 10*3/uL (ref 0.0–0.4)
Eos: 3 %
Hematocrit: 47.6 % (ref 37.5–51.0)
Hemoglobin: 15.5 g/dL (ref 13.0–17.7)
Immature Grans (Abs): 0 10*3/uL (ref 0.0–0.1)
Immature Granulocytes: 0 %
Lymphocytes Absolute: 1.5 10*3/uL (ref 0.7–3.1)
Lymphs: 32 %
MCH: 28 pg (ref 26.6–33.0)
MCHC: 32.6 g/dL (ref 31.5–35.7)
MCV: 86 fL (ref 79–97)
Monocytes Absolute: 0.7 10*3/uL (ref 0.1–0.9)
Monocytes: 15 %
Neutrophils Absolute: 2.4 10*3/uL (ref 1.4–7.0)
Neutrophils: 49 %
Platelets: 179 10*3/uL (ref 150–450)
RBC: 5.54 x10E6/uL (ref 4.14–5.80)
RDW: 15.7 % — ABNORMAL HIGH (ref 11.6–15.4)
WBC: 4.8 10*3/uL (ref 3.4–10.8)

## 2023-01-27 LAB — CMP14+EGFR
ALT: 29 [IU]/L (ref 0–44)
AST: 26 [IU]/L (ref 0–40)
Albumin: 4.5 g/dL (ref 3.8–4.8)
Alkaline Phosphatase: 86 [IU]/L (ref 44–121)
BUN/Creatinine Ratio: 9 — ABNORMAL LOW (ref 10–24)
BUN: 12 mg/dL (ref 8–27)
Bilirubin Total: 0.8 mg/dL (ref 0.0–1.2)
CO2: 24 mmol/L (ref 20–29)
Calcium: 9.6 mg/dL (ref 8.6–10.2)
Chloride: 101 mmol/L (ref 96–106)
Creatinine, Ser: 1.33 mg/dL — ABNORMAL HIGH (ref 0.76–1.27)
Globulin, Total: 2.9 g/dL (ref 1.5–4.5)
Glucose: 96 mg/dL (ref 70–99)
Potassium: 4.3 mmol/L (ref 3.5–5.2)
Sodium: 138 mmol/L (ref 134–144)
Total Protein: 7.4 g/dL (ref 6.0–8.5)
eGFR: 54 mL/min/{1.73_m2} — ABNORMAL LOW (ref >59)

## 2023-01-27 LAB — MICROALBUMIN / CREATININE URINE RATIO
Creatinine, Urine: 93.9 mg/dL
Microalb/Creat Ratio: 14 mg/g{creat} (ref 0–29)
Microalbumin, Urine: 13.2 ug/mL

## 2023-01-27 LAB — TSH: TSH: 1.83 u[IU]/mL (ref 0.450–4.500)

## 2023-01-27 LAB — LIPID PANEL
Chol/HDL Ratio: 4.7 {ratio} (ref 0.0–5.0)
Cholesterol, Total: 146 mg/dL (ref 100–199)
HDL: 31 mg/dL — ABNORMAL LOW (ref >39)
LDL Chol Calc (NIH): 82 mg/dL (ref 0–99)
Triglycerides: 196 mg/dL — ABNORMAL HIGH (ref 0–149)
VLDL Cholesterol Cal: 33 mg/dL (ref 5–40)

## 2023-02-08 DIAGNOSIS — Z0000 Encounter for general adult medical examination without abnormal findings: Secondary | ICD-10-CM | POA: Insufficient documentation

## 2023-02-08 NOTE — Assessment & Plan Note (Deleted)
 Blood pressure is well controlled, continue current medications. Will check eGFR

## 2023-02-08 NOTE — Assessment & Plan Note (Addendum)
Blood pressure is well controlled, continue current medications. Will check eGFR. EKG done NSR with HR 60

## 2023-02-08 NOTE — Assessment & Plan Note (Signed)
Cholesterol levels are stable, continue statin 

## 2023-02-08 NOTE — Assessment & Plan Note (Signed)
Behavior modifications discussed and diet history reviewed.   Pt will continue to exercise regularly and modify diet with low GI, plant based foods and decrease intake of processed foods.  Recommend intake of daily multivitamin, Vitamin D, and calcium.  Recommend colonoscopy for preventive screenings, as well as recommend immunizations that include influenza, TDAP, and Shingles  

## 2023-03-31 ENCOUNTER — Other Ambulatory Visit: Payer: Self-pay | Admitting: Nurse Practitioner

## 2023-03-31 DIAGNOSIS — N529 Male erectile dysfunction, unspecified: Secondary | ICD-10-CM

## 2023-04-21 ENCOUNTER — Other Ambulatory Visit: Payer: Self-pay | Admitting: Nurse Practitioner

## 2023-04-21 DIAGNOSIS — N529 Male erectile dysfunction, unspecified: Secondary | ICD-10-CM

## 2023-05-25 ENCOUNTER — Other Ambulatory Visit: Payer: Self-pay | Admitting: Nurse Practitioner

## 2023-05-25 DIAGNOSIS — N529 Male erectile dysfunction, unspecified: Secondary | ICD-10-CM

## 2023-05-30 ENCOUNTER — Other Ambulatory Visit: Payer: Self-pay | Admitting: Nurse Practitioner

## 2023-05-30 DIAGNOSIS — N529 Male erectile dysfunction, unspecified: Secondary | ICD-10-CM

## 2023-06-01 ENCOUNTER — Other Ambulatory Visit: Payer: Self-pay | Admitting: Nurse Practitioner

## 2023-06-01 DIAGNOSIS — N529 Male erectile dysfunction, unspecified: Secondary | ICD-10-CM

## 2023-06-01 MED ORDER — SILDENAFIL CITRATE 50 MG PO TABS: 50.0000 mg | ORAL_TABLET | Freq: Every day | ORAL | 3 refills | Status: DC | PRN

## 2023-07-26 ENCOUNTER — Other Ambulatory Visit: Payer: Self-pay | Admitting: Nurse Practitioner

## 2023-07-26 DIAGNOSIS — E782 Mixed hyperlipidemia: Secondary | ICD-10-CM

## 2023-07-26 DIAGNOSIS — I129 Hypertensive chronic kidney disease with stage 1 through stage 4 chronic kidney disease, or unspecified chronic kidney disease: Secondary | ICD-10-CM

## 2023-07-29 ENCOUNTER — Ambulatory Visit: Payer: Medicare Other | Admitting: Nurse Practitioner

## 2023-07-29 ENCOUNTER — Encounter: Payer: Self-pay | Admitting: Nurse Practitioner

## 2023-07-29 VITALS — BP 120/70 | HR 70 | Temp 98.9°F | Ht 77.0 in | Wt 205.0 lb

## 2023-07-29 DIAGNOSIS — Z85038 Personal history of other malignant neoplasm of large intestine: Secondary | ICD-10-CM

## 2023-07-29 DIAGNOSIS — E782 Mixed hyperlipidemia: Secondary | ICD-10-CM | POA: Diagnosis not present

## 2023-07-29 DIAGNOSIS — I129 Hypertensive chronic kidney disease with stage 1 through stage 4 chronic kidney disease, or unspecified chronic kidney disease: Secondary | ICD-10-CM | POA: Diagnosis not present

## 2023-07-29 DIAGNOSIS — N529 Male erectile dysfunction, unspecified: Secondary | ICD-10-CM | POA: Diagnosis not present

## 2023-07-29 DIAGNOSIS — Z2821 Immunization not carried out because of patient refusal: Secondary | ICD-10-CM

## 2023-07-29 DIAGNOSIS — N183 Chronic kidney disease, stage 3 unspecified: Secondary | ICD-10-CM | POA: Diagnosis not present

## 2023-07-29 LAB — BMP8+EGFR
BUN/Creatinine Ratio: 10 (ref 10–24)
BUN: 13 mg/dL (ref 8–27)
CO2: 19 mmol/L — ABNORMAL LOW (ref 20–29)
Calcium: 9.3 mg/dL (ref 8.6–10.2)
Chloride: 102 mmol/L (ref 96–106)
Creatinine, Ser: 1.31 mg/dL — ABNORMAL HIGH (ref 0.76–1.27)
Glucose: 89 mg/dL (ref 70–99)
Potassium: 4.1 mmol/L (ref 3.5–5.2)
Sodium: 139 mmol/L (ref 134–144)
eGFR: 55 mL/min/{1.73_m2} — ABNORMAL LOW (ref >59)

## 2023-07-29 LAB — LIPID PANEL
Chol/HDL Ratio: 4.6 ratio (ref 0.0–5.0)
Cholesterol, Total: 129 mg/dL (ref 100–199)
HDL: 28 mg/dL — ABNORMAL LOW (ref >39)
LDL Chol Calc (NIH): 80 mg/dL (ref 0–99)
Triglycerides: 116 mg/dL (ref 0–149)
VLDL Cholesterol Cal: 21 mg/dL (ref 5–40)

## 2023-07-29 MED ORDER — METOPROLOL SUCCINATE ER 50 MG PO TB24: ORAL_TABLET | ORAL | 1 refills | Status: DC

## 2023-07-29 MED ORDER — ATORVASTATIN CALCIUM 20 MG PO TABS
20.0000 mg | ORAL_TABLET | Freq: Every day | ORAL | 0 refills | Status: DC
Start: 2023-07-29 — End: 2023-09-30

## 2023-07-29 MED ORDER — METOPROLOL SUCCINATE ER 25 MG PO TB24
25.0000 mg | ORAL_TABLET | Freq: Every day | ORAL | 1 refills | Status: DC
Start: 2023-07-29 — End: 2023-09-30

## 2023-07-29 MED ORDER — SILDENAFIL CITRATE 50 MG PO TABS: 50.0000 mg | ORAL_TABLET | Freq: Every day | ORAL | 3 refills | Status: DC | PRN

## 2023-07-29 NOTE — Progress Notes (Signed)
 [place], CMA,acting as a Neurosurgeon for Susanna Epley, FNP.,have documented all relevant documentation on the behalf of Susanna Epley, FNP,as directed by  Susanna Epley, FNP while in the presence of Susanna Epley, FNP.  Subjective:  Patient ID: Patrick Woodward , male    DOB: 10/15/1940 , 83 y.o.   MRN: [id number]  Chief Complaint  Patient presents with   Hypertension    Patient presents today for a bp and chol follow up, Patient reports compliance with medication. Patient denies any chest pain, SOB, or headaches. Patient has no concerns today.    Hyperlipidemia    HPI  Here for BP f/u.   Hypertension This is a chronic problem. The current episode started more than 1 year ago. The problem has been gradually worsening since onset. The problem is uncontrolled. Pertinent negatives include no anxiety, chest pain, headaches or palpitations. There are no associated agents to hypertension. Risk factors for coronary artery disease include male gender and sedentary lifestyle. Past treatments include beta blockers. The current treatment provides no improvement. Compliance problems include diet (eats increased amounts of salt).  There is no history of angina or kidney disease. There is no history of chronic renal disease.     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     Family History  Problem Relation Age of Onset   Cancer Mother        colon   Cancer Father        prostate     Current Outpatient Medications:    aspirin  81 MG tablet, Take 1 tablet (81 mg total) by mouth daily., Disp: 100 tablet, Rfl: 12   cholecalciferol (VITAMIN D ) 1000 UNITS tablet, Take 2,000 Units by mouth daily., Disp: , Rfl:    Multiple  Vitamin (MULTIVITAMIN PO), Take 1 tablet by mouth daily., Disp: , Rfl:    vitamin E 180 MG (400 UNITS) capsule, Take 400 Units by mouth daily., Disp: , Rfl:    atorvastatin  (LIPITOR) 20 MG tablet, Take 1 tablet (20 mg total) by mouth daily., Disp: 90 tablet, Rfl: 0   metoprolol  succinate (TOPROL -XL) 25 MG 24 hr tablet, Take 1 tablet (25 mg total) by mouth daily., Disp: 90 tablet, Rfl: 1   metoprolol  succinate (TOPROL -XL) 50 MG 24 hr tablet, TAKE 1 TABLET BY MOUTH ONCE DAILY IN AM WITH 25 MG TABLET (TOTAL 75 MG), Disp: 90 tablet, Rfl: 1   sildenafil  (VIAGRA ) 50 MG tablet, Take 1 tablet (50 mg total) by mouth daily as needed for erectile dysfunction., Disp: 30 tablet, Rfl: 3   Allergies  Allergen Reactions   Procardia [Nifedipine] Other (See Comments)    irratic heart rate      Review of Systems  Constitutional: Negative.   HENT: Negative.    Eyes: Negative.   Respiratory: Negative.    Cardiovascular: Negative.  Negative for chest pain and palpitations.  Gastrointestinal: Negative.   Genitourinary: Negative.   Musculoskeletal: Negative.   Neurological: Negative.  Negative for headaches.  Psychiatric/Behavioral: Negative.       Today's Vitals   07/29/23 0840  BP: 120/70  Pulse: 70  Temp: 98.9 F (37.2 C)  TempSrc: Oral  Weight: 205 lb (93 kg)  Height: 6' 5 (1.956 m)  PainSc: 0-No pain   Body mass index is 24.31 kg/m.  Wt Readings from Last 3 Encounters:  07/29/23 205 lb (93 kg)  01/26/23 205 lb (93 kg)  01/21/23 207 lb (93.9 kg)    Objective:  Physical Exam Vitals and nursing note reviewed.  Constitutional:      General: He is not in acute distress.    Appearance: Normal appearance.   Cardiovascular:     Rate and Rhythm: Normal rate and regular rhythm.     Pulses: Normal pulses.     Heart sounds: Normal heart sounds. No murmur heard. Pulmonary:     Effort: Pulmonary effort is normal. No respiratory distress.     Breath sounds: Normal breath sounds. No wheezing.    Skin:    General: Skin is warm and dry.   Neurological:     General: No focal deficit present.     Mental Status: He is alert and oriented to person, place, and time.     Cranial Nerves: No cranial nerve deficit.     Motor: No weakness.   Psychiatric:        Mood and Affect: Mood normal.        Behavior: Behavior normal.        Thought Content: Thought content normal.        Judgment: Judgment normal.         Assessment And Plan:  Mixed hyperlipidemia Assessment & Plan: Cholesterol levels are stable, continue statin.   Orders: -     Lipid panel -     Atorvastatin  Calcium ; Take 1 tablet (20 mg total) by mouth daily.  Dispense: 90 tablet; Refill: 0  COVID-19 vaccination declined  Benign hypertension with chronic kidney disease, stage III (HCC) Assessment & Plan: Blood pressure is well controlled, continue current medications. Will check eGFR.   Orders: -     BMP8+eGFR -     Metoprolol  Succinate ER; Take 1 tablet (25 mg total) by mouth daily.  Dispense: 90 tablet; Refill: 1 -     Metoprolol  Succinate ER; TAKE 1 TABLET BY MOUTH ONCE DAILY IN AM WITH 25 MG TABLET (TOTAL 75 MG)  Dispense: 90 tablet; Refill: 1  Erectile dysfunction, unspecified erectile dysfunction type Assessment & Plan: Continue siladenafil as needed  Orders: -     Sildenafil  Citrate; Take 1 tablet (50 mg total) by mouth daily as needed for erectile dysfunction.  Dispense: 30 tablet; Refill: 3  Mixed hyperlipidemia Assessment & Plan: Cholesterol levels are stable, continue statin.   Orders: -     Lipid panel -     Atorvastatin  Calcium ; Take 1 tablet (20 mg total) by mouth daily.  Dispense: 90 tablet; Refill: 0  History of colon cancer Assessment & Plan: He plans to schedule appt with Oncology, reports he is in remission    Return for keep same next.  Patient was given opportunity to ask questions. Patient verbalized understanding of the plan and was able to repeat key elements of the plan.  All questions were answered to their satisfaction.    Inge Mangle, FNP, have reviewed all documentation for this visit. The documentation on 07/29/23 for the exam, diagnosis, procedures, and orders are all accurate and complete.   IF YOU HAVE BEEN REFERRED TO A SPECIALIST, IT MAY TAKE 1-2 WEEKS TO SCHEDULE/PROCESS THE REFERRAL. IF YOU HAVE NOT HEARD FROM US /SPECIALIST IN TWO WEEKS, PLEASE GIVE US  A CALL AT [phone] X [phone].

## 2023-07-29 NOTE — Assessment & Plan Note (Signed)
 Blood pressure is well controlled, continue current medications. Will check eGFR

## 2023-07-29 NOTE — Assessment & Plan Note (Signed)
 Continue siladenafil as needed

## 2023-07-29 NOTE — Assessment & Plan Note (Signed)
 He plans to schedule appt with Oncology, reports he is in remission

## 2023-07-29 NOTE — Assessment & Plan Note (Signed)
 Cholesterol levels are stable, continue statin.

## 2023-08-05 NOTE — Progress Notes (Signed)
 Updated

## 2023-09-29 ENCOUNTER — Ambulatory Visit (INDEPENDENT_AMBULATORY_CARE_PROVIDER_SITE_OTHER): Payer: Medicare Other

## 2023-09-29 ENCOUNTER — Ambulatory Visit: Payer: Medicare Other

## 2023-09-29 VITALS — BP 104/60 | HR 80 | Temp 98.2°F | Ht 77.0 in | Wt 204.4 lb

## 2023-09-29 DIAGNOSIS — Z0000 Encounter for general adult medical examination without abnormal findings: Secondary | ICD-10-CM | POA: Diagnosis not present

## 2023-09-29 NOTE — Patient Instructions (Signed)
 Patrick Woodward , Thank you for taking time out of your busy schedule to complete your Annual Wellness Visit with me. I enjoyed our conversation and look forward to speaking with you again next year. I, as well as your care team,  appreciate your ongoing commitment to your health goals. Please review the following plan we discussed and let me know if I can assist you in the future. Your Game plan/ To Do List    Referrals: If you haven't heard from the office you've been referred to, please reach out to them at the phone provided.   Follow up Visits: We will see or speak with you next year for your Next Medicare AWV with our clinical staff Have you seen your provider in the last 6 months (3 months if uncontrolled diabetes)? Yes  Clinician Recommendations:  Aim for 30 minutes of exercise or brisk walking, 6-8 glasses of water, and 5 servings of fruits and vegetables each day.       This is a list of the screenings recommended for you:  Health Maintenance  Topic Date Due   Flu Shot  09/10/2023   COVID-19 Vaccine (6 - 2024-25 season) 10/15/2023*   Medicare Annual Wellness Visit  09/28/2024   DTaP/Tdap/Td vaccine (2 - Td or Tdap) 01/08/2031   Pneumococcal Vaccine for age over 67  Completed   Zoster (Shingles) Vaccine  Completed   HPV Vaccine  Aged Out   Meningitis B Vaccine  Aged Out   Screening for Lung Cancer  Discontinued   Pneumococcal Vaccine  Discontinued   Colon Cancer Screening  Discontinued   Hepatitis C Screening  Discontinued  *Topic was postponed. The date shown is not the original due date.    Advanced directives: (Declined) Advance directive discussed with you today. Even though you declined this today, please call our office should you change your mind, and we can give you the proper paperwork for you to fill out. Advance Care Planning is important because it:  [x]  Makes sure you receive the medical care that is consistent with your values, goals, and preferences  [x]  It  provides guidance to your family and loved ones and reduces their decisional burden about whether or not they are making the right decisions based on your wishes.  Follow the link provided in your after visit summary or read over the paperwork we have mailed to you to help you started getting your Advance Directives in place. If you need assistance in completing these, please reach out to us  so that we can help you!  See attachments for Preventive Care and Fall Prevention Tips.

## 2023-09-29 NOTE — Progress Notes (Signed)
 Subjective:   Patrick Woodward is a 83 y.o. who presents for a Medicare Wellness preventive visit.  As a reminder, Annual Wellness Visits don't include a physical exam, and some assessments may be limited, especially if this visit is performed virtually. We may recommend an in-person follow-up visit with your provider if needed.  Visit Complete: In person    Persons Participating in Visit: Patient.  AWV Questionnaire: No: Patient Medicare AWV questionnaire was not completed prior to this visit.  Cardiac Risk Factors include: advanced age (>20men, >65 women);dyslipidemia;hypertension;male gender     Objective:    Today's Vitals   09/29/23 0818  BP: 104/60  Pulse: 80  Temp: 98.2 F (36.8 C)  TempSrc: Oral  SpO2: 94%  Weight: 204 lb 6.4 oz (92.7 kg)  Height: 6' 5 (1.956 m)   Body mass index is 24.24 kg/m.     09/29/2023    8:27 AM 01/21/2023    9:47 AM 11/19/2022   12:54 PM 09/23/2022    9:15 AM 01/20/2022   12:13 PM 09/17/2021    9:00 AM 04/25/2021   10:25 AM  Advanced Directives  Does Patient Have a Medical Advance Directive? No No No No No No No  Would patient like information on creating a medical advance directive? No - Patient declined No - Patient declined  No - Patient declined No - Patient declined No - Patient declined No - Patient declined    Current Medications (verified) Outpatient Encounter Medications as of 09/29/2023  Medication Sig   aspirin  81 MG tablet Take 1 tablet (81 mg total) by mouth daily.   atorvastatin  (LIPITOR) 20 MG tablet Take 1 tablet (20 mg total) by mouth daily.   cholecalciferol (VITAMIN D ) 1000 UNITS tablet Take 2,000 Units by mouth daily.   metoprolol  succinate (TOPROL -XL) 25 MG 24 hr tablet Take 1 tablet (25 mg total) by mouth daily.   metoprolol  succinate (TOPROL -XL) 50 MG 24 hr tablet TAKE 1 TABLET BY MOUTH ONCE DAILY IN AM WITH 25 MG TABLET (TOTAL 75 MG)   Multiple Vitamin (MULTIVITAMIN PO) Take 1 tablet by mouth daily.    sildenafil  (VIAGRA ) 50 MG tablet Take 1 tablet (50 mg total) by mouth daily as needed for erectile dysfunction.   vitamin E 180 MG (400 UNITS) capsule Take 400 Units by mouth daily.   No facility-administered encounter medications on file as of 09/29/2023.    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 Cornett, MD;  Location: [facility] OR;  Service: General;  Laterality: N/A;   HOT HEMOSTASIS N/A 08/28/2013   Procedure: HOT HEMOSTASIS (ARGON PLASMA COAGULATION/BICAP);  Surgeon: Jerrell KYM Sol, MD;  Location: [facility];  Service: Endoscopy;  Laterality: N/A;   PORT-A-CATH REMOVAL  12/02/2011  PORT-A-CATH REMOVAL  12/02/2011   Procedure: REMOVAL PORT-A-CATH;  Surgeon: Debby LABOR. Cornett, MD;  Location: [facility] OR;  Service: General;  Laterality: Left;   PORTACATH PLACEMENT  11/27/10   Dr. Noel chest   Family History  Problem Relation Age of Onset   Cancer Mother        colon   Cancer Father        prostate   Social History   Socioeconomic History   Marital status: Divorced    Spouse name: Not on file   Number of children: Not on file   Years of education: Not on file   Highest education level: 10th grade  Occupational History    Occupation: retired  Tobacco Use   Smoking status: Former    Current packs/day: 0.00    Average packs/day: 1 pack/day for 20.0 years (20.0 ttl pk-yrs)    Types: Cigarettes, Pipe, Cigars    Quit date: 11/29/1978    Years since quitting: 44.8   Smokeless tobacco: Never   Tobacco comments:    Quite 40 years ago  Vaping Use   Vaping status: Never Used  Substance and Sexual Activity   Alcohol use: No   Drug use: No   Sexual activity: Yes  Other Topics Concern   Not on file  Social History Narrative   Are you right handed or left handed? Right   Are you currently employed ?    What is your current occupation? [employer]   Do you live at home alone?   Who lives with you? friend    What type of home do you live in: 1 story or 2 story? one    Caffeine 1 cup daily at times    Social Drivers of Health   Financial Resource Strain: Low Risk  (09/29/2023)   Overall Financial Resource Strain (CARDIA)    Difficulty of Paying Living Expenses: Not very hard  Recent Concern: Financial Resource Strain - Medium Risk (07/26/2023)   Overall Financial Resource Strain (CARDIA)    Difficulty of Paying Living Expenses: Somewhat hard  Food Insecurity: No Food Insecurity (09/29/2023)   Hunger Vital Sign    Worried About Running Out of Food in the Last Year: Never true    Ran Out of Food in the Last Year: Never true  Recent Concern: Food Insecurity - Food Insecurity Present (07/26/2023)   Hunger Vital Sign    Worried About Running Out of Food in the Last Year: Sometimes true    Ran Out of Food in the Last Year: Never true  Transportation Needs: No Transportation Needs (09/29/2023)   PRAPARE - Administrator, Civil Service (Medical): No    Lack of Transportation (Non-Medical): No  Recent Concern: Transportation Needs - Unmet Transportation Needs (07/26/2023)   PRAPARE - Transportation    Lack of Transportation (Medical): No    Lack of Transportation (Non-Medical): Yes  Physical Activity:  Inactive (09/29/2023)   Exercise Vital Sign    Days of Exercise per Week: 0 days    Minutes of Exercise per Session: 0 min  Stress: No Stress Concern Present (09/29/2023)   [employer] of Occupational Health - Occupational Stress Questionnaire    Feeling of Stress: Not at all  Social Connections: Moderately Integrated (09/29/2023)   Social Connection and Isolation Panel    Frequency of Communication with Friends and Family: More than three times a week    Frequency of Social Gatherings with Friends and Family: Once a week  Attends Religious Services: More than 4 times per year    Active Member of Clubs or Organizations: No    Attends Banker Meetings: Never    Marital Status: Living with partner    Tobacco Counseling Counseling given: Not Answered Tobacco comments: Quite 40 years ago    Clinical Intake:  Pre-visit preparation completed: Yes  Pain : No/denies pain     Nutritional Risks: None Diabetes: No  No results found for: HGBA1C   How often do you need to have someone help you when you read instructions, pamphlets, or other written materials from your doctor or pharmacy?: 1 - Never  Interpreter Needed?: No  Information entered by :: NAllen LPN   Activities of Daily Living     09/29/2023    8:20 AM  In your present state of health, do you have any difficulty performing the following activities:  Hearing? 0  Vision? 1  Comment blurry sometimes  Difficulty concentrating or making decisions? 0  Walking or climbing stairs? 0  Dressing or bathing? 0  Doing errands, shopping? 0  Preparing Food and eating ? N  Using the Toilet? N  In the past six months, have you accidently leaked urine? N  Do you have problems with loss of bowel control? N  Managing your Medications? N  Managing your Finances? N  Housekeeping or managing your Housekeeping? N    Patient Care Team: Georgina Speaks, FNP as PCP - General (General Practice) Octavia Bruckner, MD as Consulting Physician (Ophthalmology) Cloretta Arley NOVAK, MD as Consulting Physician (Oncology) Rayburn Pac, MD as Consulting Physician (Nephrology) Morgan, Clayborne CROME, RN as [facility] Care Management  I have updated your Care Teams any recent Medical Services you may have received from other providers in the past year.     Assessment:   This is a routine wellness examination for Heith.  Hearing/Vision screen Hearing Screening - Comments:: Denies hearing issues Vision Screening - Comments:: No regular eye exams, [facility]   Goals Addressed             This Visit's Progress    Patient Stated       09/29/2023, get out of debt       Depression Screen     09/29/2023    8:29 AM 01/26/2023   10:23 AM 09/23/2022    9:16 AM 05/25/2022    9:32 AM 01/13/2022   10:53 AM 09/17/2021    9:02 AM 08/14/2020    9:29 AM  PHQ 2/9 Scores  PHQ - 2 Score 0 0 0 0 0 0 0  PHQ- 9 Score 0 0 0        Fall Risk     09/29/2023    8:28 AM 01/26/2023   10:23 AM 11/19/2022   12:54 PM 09/23/2022    9:16 AM 05/25/2022    9:32 AM  Fall Risk   Falls in the past year? 0 0 0 0 0  Number falls in past yr: 0 0 0 0   Injury with Fall? 0 0 0 0   Risk for fall due to : Medication side effect No Fall Risks  Medication side effect   Follow up Falls evaluation completed;Falls prevention discussed Falls evaluation completed Falls evaluation completed Falls prevention discussed;Falls evaluation completed     MEDICARE RISK AT HOME:  Medicare Risk at Home Any stairs in or around the home?: Yes If so, are there any without handrails?: No Home free of loose  throw rugs in walkways, pet beds, electrical cords, etc?: Yes Adequate lighting in your home to reduce risk of falls?: Yes Life alert?: No Use of a cane, walker or w/c?: No Grab bars in the bathroom?: No Shower chair or bench in shower?: Yes Elevated toilet seat or a handicapped toilet?: No  TIMED UP AND  GO:  Was the test performed?  No  Cognitive Function: 6CIT completed        09/29/2023    8:30 AM 09/23/2022    9:17 AM 09/17/2021    9:04 AM 08/14/2020    9:30 AM 08/09/2019    9:11 AM  6CIT Screen  What Year? 0 points 0 points 0 points 0 points 0 points  What month? 0 points 0 points 0 points 0 points 0 points  What time? 0 points 0 points 0 points 0 points 0 points  Count back from 20 0 points 0 points 0 points 0 points 0 points  Months in reverse 0 points 0 points 2 points 2 points 4 points  Repeat phrase 0 points 2 points 6 points 4 points 2 points  Total Score 0 points 2 points 8 points 6 points 6 points    Immunizations Immunization History  Administered Date(s) Administered   Fluad Quad(high Dose 65+) 10/26/2018   Influenza Split 02/23/2011   Influenza, High Dose Seasonal PF 10/30/2016   Influenza-Unspecified 10/26/2011, 10/11/2013, 10/06/2022   Moderna Covid-19 Vaccine Bivalent Booster 59yrs & up 11/19/2020   Moderna Sars-Covid-2 Vaccination 03/24/2019, 04/22/2019, 12/06/2019   Pneumococcal Conjugate-13 11/04/2017   Pneumococcal Polysaccharide-23 12/03/2011   RSV,unspecified 10/07/2021   Tdap 01/07/2021   Unspecified SARS-COV-2 Vaccination 02/08/2022   Zoster Recombinant(Shingrix ) 02/12/2021, 06/03/2021    Screening Tests Health Maintenance  Topic Date Due   INFLUENZA VACCINE  09/10/2023   COVID-19 Vaccine (6 - 2024-25 season) 10/15/2023 (Originally 10/11/2022)   Medicare Annual Wellness (AWV)  09/28/2024   DTaP/Tdap/Td (2 - Td or Tdap) 01/08/2031   Pneumococcal Vaccine: 50+ Years  Completed   Zoster Vaccines- Shingrix   Completed   HPV VACCINES  Aged Out   Meningococcal B Vaccine  Aged Out   Lung Cancer Screening  Discontinued   Pneumococcal Vaccine  Discontinued   Colonoscopy  Discontinued   Hepatitis C Screening  Discontinued    Health Maintenance  Health Maintenance Due  Topic Date Due   INFLUENZA VACCINE  09/10/2023   Health Maintenance Items  Addressed: Declines covid vaccine. Due for flu vaccine.  Additional Screening:  Vision Screening: Recommended annual ophthalmology exams for early detection of glaucoma and other disorders of the eye. Would you like a referral to an eye doctor? No    Dental Screening: Recommended annual dental exams for proper oral hygiene  Community Resource Referral / Chronic Care Management: CRR required this visit?  No   CCM required this visit?  No   Plan:    I have personally reviewed and noted the following in the patient's chart:   Medical and social history Use of alcohol, tobacco or illicit drugs  Current medications and supplements including opioid prescriptions. Patient is not currently taking opioid prescriptions. Functional ability and status Nutritional status Physical activity Advanced directives List of other physicians Hospitalizations, surgeries, and ER visits in previous 12 months Vitals Screenings to include cognitive, depression, and falls Referrals and appointments  In addition, I have reviewed and discussed with patient certain preventive protocols, quality metrics, and best practice recommendations. A written personalized care plan for preventive services as well as general  preventive health recommendations were provided to patient.   Ardella FORBES Dawn, LPN   [id number]/7974   After Visit Summary: (In Person-Printed) AVS printed and given to the patient  Notes: Nothing significant to report at this time.

## 2023-09-30 ENCOUNTER — Ambulatory Visit (INDEPENDENT_AMBULATORY_CARE_PROVIDER_SITE_OTHER): Admitting: Family Medicine

## 2023-09-30 ENCOUNTER — Encounter: Payer: Self-pay | Admitting: Family Medicine

## 2023-09-30 VITALS — BP 112/70 | HR 84 | Temp 98.2°F | Ht 77.0 in | Wt 203.0 lb

## 2023-09-30 DIAGNOSIS — E782 Mixed hyperlipidemia: Secondary | ICD-10-CM

## 2023-09-30 DIAGNOSIS — I129 Hypertensive chronic kidney disease with stage 1 through stage 4 chronic kidney disease, or unspecified chronic kidney disease: Secondary | ICD-10-CM | POA: Diagnosis not present

## 2023-09-30 DIAGNOSIS — N528 Other male erectile dysfunction: Secondary | ICD-10-CM | POA: Diagnosis not present

## 2023-09-30 DIAGNOSIS — Z79899 Other long term (current) drug therapy: Secondary | ICD-10-CM | POA: Insufficient documentation

## 2023-09-30 DIAGNOSIS — N183 Chronic kidney disease, stage 3 unspecified: Secondary | ICD-10-CM | POA: Diagnosis not present

## 2023-09-30 MED ORDER — METOPROLOL SUCCINATE ER 25 MG PO TB24: 25.0000 mg | ORAL_TABLET | Freq: Every day | ORAL | 1 refills | Status: DC

## 2023-09-30 MED ORDER — ATORVASTATIN CALCIUM 20 MG PO TABS: 20.0000 mg | ORAL_TABLET | Freq: Every day | ORAL | 0 refills | Status: DC

## 2023-09-30 MED ORDER — METOPROLOL SUCCINATE ER 50 MG PO TB24: ORAL_TABLET | ORAL | 1 refills | Status: DC

## 2023-09-30 NOTE — Patient Instructions (Signed)
 Hypertension, Adult Hypertension is another name for high blood pressure. High blood pressure forces your heart to work harder to pump blood. This can cause problems over time. There are two numbers in a blood pressure reading. There is a top number (systolic) over a bottom number (diastolic). It is best to have a blood pressure that is below 120/80. What are the causes? The cause of this condition is not known. Some other conditions can lead to high blood pressure. What increases the risk? Some lifestyle factors can make you more likely to develop high blood pressure: Smoking. Not getting enough exercise or physical activity. Being overweight. Having too much fat, sugar, calories, or salt (sodium) in your diet. Drinking too much alcohol . Other risk factors include: Having any of these conditions: Heart disease. Diabetes. High cholesterol. Kidney disease. Obstructive sleep apnea. Having a family history of high blood pressure and high cholesterol. Age. The risk increases with age. Stress. What are the signs or symptoms? High blood pressure may not cause symptoms. Very high blood pressure (hypertensive crisis) may cause: Headache. Fast or uneven heartbeats (palpitations). Shortness of breath. Nosebleed. Vomiting or feeling like you may vomit (nauseous). Changes in how you see. Very bad chest pain. Feeling dizzy. Seizures. How is this treated? This condition is treated by making healthy lifestyle changes, such as: Eating healthy foods. Exercising more. Drinking less alcohol . Your doctor may prescribe medicine if lifestyle changes do not help enough and if: Your top number is above 130. Your bottom number is above 80. Your personal target blood pressure may vary. Follow these instructions at home: Eating and drinking  If told, follow the DASH eating plan. To follow this plan: Fill one half of your plate at each meal with fruits and vegetables. Fill one fourth of your plate  at each meal with whole grains. Whole grains include whole-wheat pasta, brown rice, and whole-grain bread. Eat or drink low-fat dairy products, such as skim milk or low-fat yogurt. Fill one fourth of your plate at each meal with low-fat (lean) proteins. Low-fat proteins include fish, chicken without skin, eggs, beans, and tofu. Avoid fatty meat, cured and processed meat, or chicken with skin. Avoid pre-made or processed food. Limit the amount of salt in your diet to less than 1,500 mg each day. Do not drink alcohol  if: Your doctor tells you not to drink. You are pregnant, may be pregnant, or are planning to become pregnant. If you drink alcohol : Limit how much you have to: 0-1 drink a day for women. 0-2 drinks a day for men. Know how much alcohol  is in your drink. In the [place] one drink equals one 12 oz bottle of beer (355 mL), one 5 oz glass of wine (148 mL), or one 1 oz glass of hard liquor (44 mL). Lifestyle  Work with your doctor to stay at a healthy weight or to lose weight. Ask your doctor what the best weight is for you. Get at least 30 minutes of exercise that causes your heart to beat faster (aerobic exercise) most days of the week. This may include walking, swimming, or biking. Get at least 30 minutes of exercise that strengthens your muscles (resistance exercise) at least 3 days a week. This may include lifting weights or doing Pilates. Do not smoke or use any products that contain nicotine  or tobacco. If you need help quitting, ask your doctor. Check your blood pressure at home as told by your doctor. Keep all follow-up visits. Medicines Take over-the-counter and prescription medicines  only as told by your doctor. Follow directions carefully. Do not skip doses of blood pressure medicine. The medicine does not work as well if you skip doses. Skipping doses also puts you at risk for problems. Ask your doctor about side effects or reactions to medicines that you should watch  for. Contact a doctor if: You think you are having a reaction to the medicine you are taking. You have headaches that keep coming back. You feel dizzy. You have swelling in your ankles. You have trouble with your vision. Get help right away if: You get a very bad headache. You start to feel mixed up (confused). You feel weak or numb. You feel faint. You have very bad pain in your: Chest. Belly (abdomen). You vomit more than once. You have trouble breathing. These symptoms may be an emergency. Get help right away. Call 911. Do not wait to see if the symptoms will go away. Do not drive yourself to the hospital. Summary Hypertension is another name for high blood pressure. High blood pressure forces your heart to work harder to pump blood. For most people, a normal blood pressure is less than 120/80. Making healthy choices can help lower blood pressure. If your blood pressure does not get lower with healthy choices, you may need to take medicine. This information is not intended to replace advice given to you by your health care provider. Make sure you discuss any questions you have with your health care provider. Document Revised: 11/14/2020 Document Reviewed: 11/14/2020 Elsevier Patient Education  2024 [employer].

## 2023-09-30 NOTE — Progress Notes (Signed)
 I,Jameka J Llittleton, CMA,acting as a Neurosurgeon for [employer], NP.,have documented all relevant documentation on the behalf of Bruna Creighton, NP,as directed by  Bruna Creighton, NP while in the presence of Bruna Creighton, NP.  Subjective:  Patient ID: Patrick Woodward , male    DOB: 1940/12/17 , 83 y.o.   MRN: [id number]  Chief Complaint  Patient presents with   Referral    Patient presents today for a referral. He reports he needs to see a cardiologist.     HPI Discussed the use of AI scribe software for clinical note transcription with the patient, who gave verbal consent to proceed.  History of Present Illness     Patrick Woodward is an 83 year old male who presents for an evaluation and referral for a stress test required by his employer.  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 His employer mandates an annual stress test and cardiac evaluation. He has been under the care of a cardiologist within the same healthcare system and had his last evaluation in September of the previous year. He seeks a referral to ensure insurance coverage for the upcoming evaluation.  He states he is in good health with no complaints. His job requires physical activity, including lifting up to 100 pounds, which he feels capable of doing. 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  He has a medical diagnosis of hypertension with CKD 3, hyperlipidemia and erectile dysfunction. He is currently on medications for metoprolol  XL, Atorvastatin  and sildenafil , which he has been taking for five to ten years.        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   GERD (gastroesophageal reflux disease)    Hypercholesteremia    Hypertension    Malignant neoplasm of colon, unspecified part of colon (HCC) 09/23/2022   Neuropathy    in  fingers and feet due to chemotherapy     Family History  Problem Relation Age of Onset   Cancer Mother        colon   Cancer Father        prostate     Current Outpatient Medications:    aspirin  81 MG tablet, Take 1 tablet (81 mg total) by mouth daily., Disp: 100 tablet, Rfl: 12   cholecalciferol (VITAMIN D ) 1000 UNITS tablet, Take 2,000 Units by mouth daily., Disp: , Rfl:    Multiple Vitamin (MULTIVITAMIN PO), Take 1 tablet by mouth daily., Disp: , Rfl:    sildenafil  (VIAGRA ) 50 MG tablet, Take 1 tablet (50 mg total) by mouth daily as needed for erectile dysfunction., Disp: 30 tablet, Rfl: 3   vitamin E 180 MG (400 UNITS) capsule, Take 400 Units by mouth daily., Disp: , Rfl:    atorvastatin  (LIPITOR) 20 MG tablet, Take 1 tablet (20 mg total) by mouth daily., Disp: 90 tablet, Rfl: 0   metoprolol  succinate (TOPROL -XL) 25 MG 24 hr tablet, Take 1 tablet (25 mg total) by mouth daily., Disp: 90 tablet, Rfl: 1   metoprolol  succinate (TOPROL -XL) 50 MG 24 hr tablet, TAKE 1 TABLET BY MOUTH ONCE DAILY IN AM WITH 25 MG TABLET (TOTAL 75 MG), Disp: 90 tablet, Rfl: 1   Allergies  Allergen Reactions   Procardia [Nifedipine] Other (See Comments)    irratic heart rate      Review of Systems  Constitutional: Negative.   HENT: Negative.    Respiratory: Negative.    Cardiovascular: Negative.   Gastrointestinal: Negative.   Genitourinary: Negative.   Musculoskeletal: Negative.   Psychiatric/Behavioral: Negative.       Today's Vitals   09/30/23 1007  BP: 112/70  Pulse: 84  Temp: 98.2 F (36.8 C)  TempSrc: Oral  Weight: 203 lb (92.1 kg)  Height: 6' 5 (1.956 m)  PainSc: 0-No pain   Body mass index is 24.07 kg/m.  Wt Readings from Last 3 Encounters:  10/14/23 208 lb (94.3 kg)  09/30/23 203 lb (92.1 kg)  09/29/23 204 lb 6.4 oz (92.7 kg)    The ASCVD Risk score (Arnett DK, et al., 2019) failed to calculate for the following reasons:   The 2019 ASCVD risk score is only valid for ages  19 to 62  Objective:  Physical Exam HENT:     Head: Normocephalic.  Cardiovascular:     Rate and Rhythm: Normal rate and regular rhythm.  Pulmonary:     Effort: Pulmonary effort is normal.     Breath sounds: Normal breath sounds.  Neurological:     Mental Status: He is alert.         Assessment And Plan:  Benign hypertension with chronic kidney disease, stage III (HCC) Assessment & Plan: Encouraged to keep BP well controlled and avoid use of NSAIDs   Orders: -     CBC -     CMP14+EGFR -     Metoprolol  Succinate ER; Take 1 tablet (25 mg total) by mouth daily.  Dispense: 90 tablet; Refill: 1 -     Metoprolol  Succinate ER; TAKE 1 TABLET BY MOUTH ONCE DAILY IN AM WITH 25 MG TABLET (TOTAL 75 MG)  Dispense: 90 tablet; Refill: 1  Mixed hyperlipidemia Assessment & Plan: Continue statin therapy.  Orders: -     Lipid panel -     Atorvastatin  Calcium ; Take 1 tablet (20 mg total) by mouth daily.  Dispense: 90 tablet; Refill: 0  Other long term (current) drug therapy  Other male erectile dysfunction Assessment & Plan: Uses sildenafil  as needed     Assessment & Plan Adult Wellness Visit Annual wellness visit for an 83 year old male, active as a tour bus driver, with good physical function and compliance with Medicare wellness requirements. - Perform blood work for routine wellness evaluation.  Hypertension - Refill blood pressure medication.     Return in 6 months (on 04/01/2024), or if symptoms worsen or fail to improve, for keep next appt.  Patient was given opportunity to ask questions. Patient verbalized understanding of the plan and was able to repeat key elements of the plan. All questions were answered to their satisfaction.    I, Bruna Creighton, NP, have reviewed all documentation for this visit. The documentation on 10/20/2023 for the exam, diagnosis, procedures, and orders are all accurate and complete.    IF YOU HAVE BEEN REFERRED TO A SPECIALIST, IT MAY TAKE 1-2  WEEKS TO SCHEDULE/PROCESS THE REFERRAL. IF YOU HAVE NOT HEARD FROM US /SPECIALIST IN TWO WEEKS, PLEASE GIVE US  A CALL AT [phone].

## 2023-10-01 LAB — CBC
Hematocrit: 48.5 % (ref 37.5–51.0)
Hemoglobin: 15.9 g/dL (ref 13.0–17.7)
MCH: 28.3 pg (ref 26.6–33.0)
MCHC: 32.8 g/dL (ref 31.5–35.7)
MCV: 87 fL (ref 79–97)
Platelets: 172 x10E3/uL (ref 150–450)
RBC: 5.61 x10E6/uL (ref 4.14–5.80)
RDW: 15.7 % — ABNORMAL HIGH (ref 11.6–15.4)
WBC: 4.5 x10E3/uL (ref 3.4–10.8)

## 2023-10-01 LAB — CMP14+EGFR
ALT: 30 IU/L (ref 0–44)
AST: 23 IU/L (ref 0–40)
Albumin: 4.5 g/dL (ref 3.8–4.8)
Alkaline Phosphatase: 82 IU/L (ref 44–121)
BUN/Creatinine Ratio: 10 (ref 10–24)
BUN: 13 mg/dL (ref 8–27)
Bilirubin Total: 1 mg/dL (ref 0.0–1.2)
CO2: 20 mmol/L (ref 20–29)
Calcium: 9.4 mg/dL (ref 8.6–10.2)
Chloride: 104 mmol/L (ref 96–106)
Creatinine, Ser: 1.27 mg/dL (ref 0.76–1.27)
Globulin, Total: 2.8 g/dL (ref 1.5–4.5)
Glucose: 98 mg/dL (ref 70–99)
Potassium: 4.2 mmol/L (ref 3.5–5.2)
Sodium: 140 mmol/L (ref 134–144)
Total Protein: 7.3 g/dL (ref 6.0–8.5)
eGFR: 57 mL/min/1.73 — ABNORMAL LOW (ref >59)

## 2023-10-01 LAB — LIPID PANEL
Chol/HDL Ratio: 4.6 ratio (ref 0.0–5.0)
Cholesterol, Total: 139 mg/dL (ref 100–199)
HDL: 30 mg/dL — ABNORMAL LOW (ref >39)
LDL Chol Calc (NIH): 83 mg/dL (ref 0–99)
Triglycerides: 149 mg/dL (ref 0–149)
VLDL Cholesterol Cal: 26 mg/dL (ref 5–40)

## 2023-10-12 NOTE — Progress Notes (Unsigned)
 Cardiology Office Note    Date:  10/14/2023  ID:  Patrick Woodward, DOB 1940-09-29, MRN [id number] PCP:  Georgina Speaks, FNP  Cardiologist: Formerly Dr. 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 male with visit-pertinent history of LVH, HTN, HLD managed by PCP, CKD stage 3a, peripheral neuropathy, GERD, colon cancer who has followed with Dr. 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 Last echo 10/2022 showed EF 60-65%, moderate asymmetric LVH of the basal septal segment, normal RV. LVH was seen in prior echoes as well. ETT reported moderately impaired exercise capacity ( 1 sec), upsloping ST III, avF, V4-V6, felt to be a negative, adequate stress test.  He returns for follow-up today reporting he is doing well without any new cardiac symptoms. No CP, SOB, palpitations, edema, syncope, near-syncope. He reports that on previous ETTs he had some dyspnea with higher levels of exertion but does not typically have this in his line of work. 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 He states he walks a lot for his job and has not noticed any symptoms with regular activity.   Labwork independently reviewed: 09/2023 trig 149, LDL 83, K 4.2, LFTs wnl, Hgb 15.9, plt OK, CR 1.27  ROS: .    Please see the history of present illness.  All other systems are reviewed and otherwise negative.  Studies Reviewed: SABRA    EKG:  EKG is ordered today, personally reviewed, demonstrating:  EKG Interpretation Date/Time:  Thursday October 14 2023 08:05:59 EDT Ventricular Rate:  73 PR Interval:  172 QRS Duration:  88 QT Interval:  386 QTC Calculation: 425 R Axis:   9  Text Interpretation: Normal sinus rhythm Normal ECG When compared with ECG of 30-Nov-2011 10:27, No significant change was found Confirmed by Braiden Rodman [phone]) on 10/14/2023 8:23:04 AM   CV Studies: Cardiac studies reviewed  are outlined and summarized above. Otherwise please see EMR for full report.   Current Reported Medications:.    Current Meds  Medication Sig   aspirin  81 MG tablet Take 1 tablet (81 mg total) by mouth daily.   atorvastatin  (LIPITOR) 20 MG tablet Take 1 tablet (20 mg total) by mouth daily.   cholecalciferol (VITAMIN D ) 1000 UNITS tablet Take 2,000 Units by mouth daily.   metoprolol  succinate (TOPROL -XL) 25 MG 24 hr tablet Take 1 tablet (25 mg total) by mouth daily.   metoprolol  succinate (TOPROL -XL) 50 MG 24 hr tablet TAKE 1 TABLET BY MOUTH ONCE DAILY IN AM WITH 25 MG TABLET (TOTAL 75 MG)   Multiple Vitamin (MULTIVITAMIN PO) Take 1 tablet by mouth daily.   sildenafil  (VIAGRA ) 50 MG tablet Take 1 tablet (50 mg total) by mouth daily as needed for erectile dysfunction.   vitamin E 180 MG (400 UNITS) capsule Take 400 Units by mouth daily.    Physical Exam:    VS:  BP 130/60   Pulse 73   Ht 6' 5.5 (1.969 m)   Wt 208 lb (94.3 kg)   SpO2 96%   BMI 24.35 kg/m    Wt Readings from Last 3 Encounters:  10/14/23 208 lb (94.3 kg)  09/30/23 203 lb (92.1 kg)  09/29/23 204 lb 6.4 oz (92.7 kg)    GEN: Well nourished, well developed in no acute distress NECK: No JVD; No carotid bruits CARDIAC: RRR, no murmurs, rubs, gallops RESPIRATORY:  Clear to auscultation without rales,  wheezing or rhonchi  ABDOMEN: Soft, non-tender, non-distended EXTREMITIES:  No edema; No acute deformity   Asessement and Plan:.     1. Encounter for CDL employment - exam and EKG today reassuring without any new concerns. Discussed case with DOD Dr. Shlomo. Given previous moderately impaired exercise capacity and age, we would recommend coupling the exercise test with nuclear imaging to ensure perfusion is normal. Will also update echocardiogram to ensure no progression in left ventricular hypertrophy or change in LV function. Will plan to finalize recommendations for clearance upon completion of studies. Patient aware to  notify for any new concerning symptoms whatsoever. He will hold BB 24 hours before stress test.  Informed Consent   Shared Decision Making/Informed Consent The risks [chest pain, shortness of breath, cardiac arrhythmias, dizziness, blood pressure fluctuations, myocardial infarction, stroke/transient ischemic attack, nausea, vomiting, allergic reaction, radiation exposure, metallic taste sensation and life-threatening complications (estimated to be 1 in 10,000)], benefits (risk stratification, diagnosing coronary artery disease, treatment guidance) and alternatives of a nuclear stress test were discussed in detail with Mr. Lo and he agrees to proceed.     2. Left ventricular hypertrophy - suspect related to age/HTN. Rechecking echo. No symptoms of CHF. Denies family history of cardiac disease or sudden cardiac death.  3. Essential HTN - BP appears appropriate for age, no changes made today. Managed by primary care.  4. CKD 3a - followed by PCP, last Cr appeared stable.    Disposition: F/u with Dr. Floretta in 1 year to establish care, sooner if testing abnormal.  Signed, [name] Bring, PA-C

## 2023-10-14 ENCOUNTER — Encounter: Payer: Self-pay | Admitting: Physician Assistant

## 2023-10-14 ENCOUNTER — Ambulatory Visit: Attending: Physician Assistant | Admitting: Physician Assistant

## 2023-10-14 VITALS — BP 130/60 | HR 73 | Ht 77.5 in | Wt 208.0 lb

## 2023-10-14 DIAGNOSIS — N1831 Chronic kidney disease, stage 3a: Secondary | ICD-10-CM | POA: Diagnosis not present

## 2023-10-14 DIAGNOSIS — I517 Cardiomegaly: Secondary | ICD-10-CM | POA: Diagnosis not present

## 2023-10-14 DIAGNOSIS — Z024 Encounter for examination for driving license: Secondary | ICD-10-CM | POA: Diagnosis not present

## 2023-10-14 DIAGNOSIS — I10 Essential (primary) hypertension: Secondary | ICD-10-CM

## 2023-10-14 NOTE — Patient Instructions (Signed)
 Medication Instructions:  Your physician recommends that you continue on your current medications as directed. Please refer to the Current Medication list given to you today.  *If you need a refill on your cardiac medications before your next appointment, please call your pharmacy*  Lab Work: None ordered  If you have labs (blood work) drawn today and your tests are completely normal, you will receive your results only by: MyChart Message (if you have MyChart) OR A paper copy in the mail If you have any lab test that is abnormal or we need to change your treatment, we will call you to review the results.  Testing/Procedures: Your physician has requested that you have an echocardiogram. Echocardiography is a painless test that uses sound waves to create images of your heart. It provides your doctor with information about the size and shape of your heart and how well your heart's chambers and valves are working. This procedure takes approximately one hour. There are no restrictions for this procedure. Please do NOT wear cologne, perfume, aftershave, or lotions (deodorant is allowed). Please arrive 15 minutes prior to your appointment time.  Please note: We ask at that you not bring children with you during ultrasound (echo/ vascular) testing. Due to room size and safety concerns, children are not allowed in the ultrasound rooms during exams. Our front office staff cannot provide observation of children in our lobby area while testing is being conducted. An adult accompanying a patient to their appointment will only be allowed in the ultrasound room at the discretion of the ultrasound technician under special circumstances. We apologize for any inconvenience.   Your physician has requested that you have en exercise stress myoview. For further information please visit [email or web address] Please follow instruction sheet, BELOW:    You are scheduled for a Myocardial Perfusion Study. Please  arrive 15 minutes prior to your appointment time for registration and insurance purposes.  The test will take approximately 3-4 hours to complete; you may bring reading material.  If someone comes with you to your appointment, they will need to remain in the main lobby due to limited space in the testing area.  If you are pregnant or breastfeeding, please notify the nuclear lab prior to your appointment.  HOW TO PREPARE FOR YOUR TEST:  DO NOT eat or drink 3 hours prior to your test, except you may have water DO NOT consume products containing caffeine (regular or decaffeinated) 12 hours prior to your test (ex: coffee, chocolate, sodas, tea.) DO bring a list of your current medications with you.  If not listed below, you may take your medications as normal. DO NOT TAKE METOPROLOL   for 24 HOURS prior to the test.  Bring the medication to your appointment as you may be required to take it once the test is complete. DO wear comfortable clothes (no dresses or overalls) and walking shoes, tennis shoes preferred (No heels or open toe shoes are allowed) DO NOT wear cologne, perfume, aftershave, or lotions (deodorant is allowed). If these instructions are not followed, your test will have to be rescheduled.   Please report to [address] [place], [place] [address].  If you have questions or concerns about your appointment, you can call the Nuclear Lab [phone].  If you cannot keep your appointment, please provide 24 hours notification to the Nuclear Lab, to avoid a possible $50 charge to your account.   Follow-Up: At [facility], you and your health needs are our priority.  As part of our  continuing mission to provide you with exceptional heart care, our providers are all part of one team.  This team includes your primary Cardiologist (physician) and Advanced Practice Providers or APPs (Physician Assistants and Nurse Practitioners) who all work together to provide you with the care you  need, when you need it.  Your next appointment:   12 month(s)  Provider:   Georganna Archer, MD    We recommend signing up for the patient portal called MyChart.  Sign up information is provided on this After Visit Summary.  MyChart is used to connect with patients for Virtual Visits (Telemedicine).  Patients are able to view lab/test results, encounter notes, upcoming appointments, etc.  Non-urgent messages can be sent to your provider as well.   To learn more about what you can do with MyChart, go to [email or web address].   Other Instructions

## 2023-10-15 ENCOUNTER — Encounter (HOSPITAL_COMMUNITY): Payer: Self-pay | Admitting: *Deleted

## 2023-10-20 ENCOUNTER — Other Ambulatory Visit: Payer: Self-pay | Admitting: Physician Assistant

## 2023-10-20 ENCOUNTER — Ambulatory Visit: Payer: Self-pay | Admitting: Family Medicine

## 2023-10-20 ENCOUNTER — Encounter: Payer: Self-pay | Admitting: Physician Assistant

## 2023-10-20 DIAGNOSIS — I10 Essential (primary) hypertension: Secondary | ICD-10-CM

## 2023-10-20 DIAGNOSIS — I517 Cardiomegaly: Secondary | ICD-10-CM

## 2023-10-20 DIAGNOSIS — N1831 Chronic kidney disease, stage 3a: Secondary | ICD-10-CM

## 2023-10-20 DIAGNOSIS — Z024 Encounter for examination for driving license: Secondary | ICD-10-CM

## 2023-10-20 NOTE — Progress Notes (Signed)
 Kidney function is stable. HDL wich is good cholesterol is low, To increase HDL (good) cholesterol, eat foods rich in healthy fats, fiber,  include foods like fatty fish, olive oil, nuts, seeds, and whole grains in your diet. Salmon, mackerel, herring, sardines, and anchovies are excellent sources of omega-3 fatty acids, which can boost HDL levels and improve heart health.    Thank you!

## 2023-10-20 NOTE — Assessment & Plan Note (Signed)
Encouraged to keep BP well controlled and avoid use of NSAIDs

## 2023-10-20 NOTE — Assessment & Plan Note (Signed)
 Continue statin therapy

## 2023-10-20 NOTE — Assessment & Plan Note (Signed)
 Uses sildenafil as needed

## 2023-10-25 ENCOUNTER — Ambulatory Visit (HOSPITAL_COMMUNITY)
Admission: RE | Admit: 2023-10-25 | Discharge: 2023-10-25 | Disposition: A | Discharge status: Home / Self Care | Source: Ambulatory Visit | Attending: Physician Assistant | Admitting: Physician Assistant

## 2023-10-25 DIAGNOSIS — I10 Essential (primary) hypertension: Secondary | ICD-10-CM | POA: Insufficient documentation

## 2023-10-25 DIAGNOSIS — N1831 Chronic kidney disease, stage 3a: Secondary | ICD-10-CM | POA: Insufficient documentation

## 2023-10-25 DIAGNOSIS — I517 Cardiomegaly: Secondary | ICD-10-CM | POA: Insufficient documentation

## 2023-10-25 DIAGNOSIS — Z024 Encounter for examination for driving license: Secondary | ICD-10-CM | POA: Insufficient documentation

## 2023-10-25 LAB — MYOCARDIAL PERFUSION IMAGING
Angina Index: 0
Duke Treadmill Score: 4
Estimated workload: 5
Exercise duration (min): 4 min
Exercise duration (sec): 1 s
LV dias vol: 80 mL (ref 62–150)
LV sys vol: 22 mL (ref 4.2–5.8)
MPHR: 140 {beats}/min
Nuc Stress EF: 73 %
Peak HR: 162 {beats}/min
Percent HR: 115 %
Rest HR: 90 {beats}/min
Rest Nuclear Isotope Dose: 10.9 mCi
SDS: 0
SRS: 0
SSS: 0
ST Depression (mm): 0 mm
Stress Nuclear Isotope Dose: 32.5 mCi
TID: 0.98

## 2023-10-25 MED ORDER — TECHNETIUM TC 99M TETROFOSMIN IV KIT
32.5000 | PACK | Freq: Once | INTRAVENOUS | Status: AC | PRN
  Administered 2023-10-25: 32.5 via INTRAVENOUS

## 2023-10-25 MED ORDER — TECHNETIUM TC 99M TETROFOSMIN IV KIT
10.9000 | PACK | Freq: Once | INTRAVENOUS | Status: AC | PRN
  Administered 2023-10-25: 10.9 via INTRAVENOUS

## 2023-10-26 ENCOUNTER — Ambulatory Visit: Payer: Self-pay | Admitting: Physician Assistant

## 2023-10-29 ENCOUNTER — Ambulatory Visit: Payer: Self-pay | Admitting: Physician Assistant

## 2023-11-01 NOTE — Progress Notes (Signed)
 Pt has been made aware of normal result and verbalized understanding.  jw

## 2023-11-19 ENCOUNTER — Ambulatory Visit (HOSPITAL_COMMUNITY)
Admission: RE | Admit: 2023-11-19 | Discharge: 2023-11-19 | Disposition: A | Discharge status: Home / Self Care | Source: Ambulatory Visit | Attending: Physician Assistant | Admitting: Physician Assistant

## 2023-11-19 DIAGNOSIS — Z024 Encounter for examination for driving license: Secondary | ICD-10-CM | POA: Diagnosis not present

## 2023-11-19 DIAGNOSIS — Z0189 Encounter for other specified special examinations: Secondary | ICD-10-CM | POA: Diagnosis not present

## 2023-11-19 DIAGNOSIS — I10 Essential (primary) hypertension: Secondary | ICD-10-CM | POA: Diagnosis not present

## 2023-11-19 DIAGNOSIS — N1831 Chronic kidney disease, stage 3a: Secondary | ICD-10-CM | POA: Diagnosis not present

## 2023-11-19 DIAGNOSIS — I517 Cardiomegaly: Secondary | ICD-10-CM | POA: Insufficient documentation

## 2023-11-19 LAB — ECHOCARDIOGRAM COMPLETE
Area-P 1/2: 2.95 cm2
S' Lateral: 2.2 cm

## 2023-11-20 ENCOUNTER — Other Ambulatory Visit: Payer: Self-pay | Admitting: Nurse Practitioner

## 2023-11-20 DIAGNOSIS — N529 Male erectile dysfunction, unspecified: Secondary | ICD-10-CM

## 2023-11-24 ENCOUNTER — Telehealth: Payer: Self-pay | Admitting: Physician Assistant

## 2023-11-24 ENCOUNTER — Encounter: Payer: Self-pay | Admitting: Physician Assistant

## 2023-11-24 NOTE — Telephone Encounter (Signed)
 Attempted phone call to pt and left voicemail message to contact office at [phone].  Will also send message through MyChart.

## 2023-11-24 NOTE — Telephone Encounter (Addendum)
 Will route to triage over APP results box since this is slightly less straightforward.  FINALIZING DOT CLEARANCE:  Please let patient know good news - echo was reassuring with normal pump function. Heart muscle showed mild thickening, which is improved from prior study. Overall, no cardiac contraindication identified to DOT clearance. I have placed his clearance form and my letterhead letter in my mailbox if you can give this to the patient. The DOT form also states he will need a copy of the actual echo, stress test, and OV so please assist with this. I did mention on his letterhead that I will defer evaluation of his cognition/vision to his DOT examiner/general medical team.   Please make sure Patrick Woodward knows to let us  know if he develops any new heart symptoms or concerns.   Please also let him know that his testing did suggest some calcification of the coronaries/aorta so risk factor reduction continues to be important. His last LDL was 83, managed by PCP on atorvastatin  20mg  daily. We would typically suggest a goal LDL of less than 70. If he is OK with us  adjusting meds, would increase atorvastatin  to 40mg  daily with follow-up fasting lipid profile/LFTs in 2 months, but if he wants to discuss with primary care, can forward this message to them after you speak to him. Thank you!

## 2023-12-01 ENCOUNTER — Telehealth: Payer: Self-pay | Admitting: Student in an Organized Health Care Education/Training Program

## 2023-12-01 NOTE — Telephone Encounter (Signed)
 Pt made aware of result findings. Will leave needed DOT clearance form, DOT letter of cardiac clearance and testing that needs to be included. Pt on his way now to pick up.

## 2023-12-01 NOTE — Telephone Encounter (Signed)
 Pt would like a c/b about results and also wanted to see where the paperwork was sent concerning DOT. Please Advise

## 2023-12-16 ENCOUNTER — Other Ambulatory Visit: Payer: Self-pay | Admitting: Nurse Practitioner

## 2023-12-16 DIAGNOSIS — N529 Male erectile dysfunction, unspecified: Secondary | ICD-10-CM

## 2024-01-11 ENCOUNTER — Other Ambulatory Visit: Payer: Self-pay

## 2024-01-11 DIAGNOSIS — N529 Male erectile dysfunction, unspecified: Secondary | ICD-10-CM

## 2024-01-11 MED ORDER — SILDENAFIL CITRATE 50 MG PO TABS: 50.0000 mg | ORAL_TABLET | Freq: Every day | ORAL | 1 refills | Status: AC | PRN

## 2024-01-13 ENCOUNTER — Other Ambulatory Visit: Payer: Self-pay | Admitting: Nurse Practitioner

## 2024-01-13 DIAGNOSIS — I129 Hypertensive chronic kidney disease with stage 1 through stage 4 chronic kidney disease, or unspecified chronic kidney disease: Secondary | ICD-10-CM

## 2024-01-13 DIAGNOSIS — N529 Male erectile dysfunction, unspecified: Secondary | ICD-10-CM

## 2024-01-13 NOTE — Telephone Encounter (Unsigned)
 Copied from CRM [id number]. Topic: Clinical - Medication Refill >> Jan 13, 2024 10:29 AM Fonda T wrote: Medication:  metoprolol  succinate (TOPROL -XL) 25 MG 24 hr tablet  metoprolol  succinate (TOPROL -XL) 50 MG 24 hr tablet  sildenafil  (VIAGRA ) 50 MG tablet   Has the patient contacted their pharmacy? Yes. States pharmacy sent refill request, but no response. Advised to contact office.    This is the patient's preferred pharmacy:  [id number] - [place] ([place]), [place] - [address]  ([place]) [place] [address] Phone: [phone] Fax: [phone]   Is this the correct pharmacy for this prescription? Yes If no, delete pharmacy and type the correct one.   Has the prescription been filled recently? Yes  Is the patient out of the medication? Yes  Has the patient been seen for an appointment in the last year OR does the patient have an upcoming appointment? Yes  Can we respond through MyChart? No, prefers phone call at [phone]  Agent: Please be advised that Rx refills may take up to 3 business days. We ask that you follow-up with your pharmacy.

## 2024-01-17 ENCOUNTER — Telehealth: Payer: Self-pay | Admitting: Oncology

## 2024-01-17 ENCOUNTER — Other Ambulatory Visit: Payer: Self-pay

## 2024-01-17 DIAGNOSIS — N183 Chronic kidney disease, stage 3 unspecified: Secondary | ICD-10-CM

## 2024-01-17 MED ORDER — METOPROLOL SUCCINATE ER 50 MG PO TB24: ORAL_TABLET | ORAL | 1 refills | Status: AC

## 2024-01-17 MED ORDER — METOPROLOL SUCCINATE ER 25 MG PO TB24: 25.0000 mg | ORAL_TABLET | Freq: Every day | ORAL | 1 refills | Status: AC

## 2024-01-17 NOTE — Telephone Encounter (Signed)
 PT called to reschedule appt, forgot about the appt.

## 2024-01-24 ENCOUNTER — Ambulatory Visit: Payer: Medicare Other | Admitting: Oncology

## 2024-01-24 ENCOUNTER — Other Ambulatory Visit: Payer: Medicare Other

## 2024-01-28 ENCOUNTER — Encounter: Payer: Self-pay | Admitting: Nurse Practitioner

## 2024-01-28 ENCOUNTER — Ambulatory Visit: Admitting: Nurse Practitioner

## 2024-01-28 VITALS — BP 130/80 | HR 78 | Temp 98.6°F | Ht 77.0 in | Wt 202.0 lb

## 2024-01-28 DIAGNOSIS — Z85038 Personal history of other malignant neoplasm of large intestine: Secondary | ICD-10-CM | POA: Diagnosis not present

## 2024-01-28 DIAGNOSIS — N529 Male erectile dysfunction, unspecified: Secondary | ICD-10-CM

## 2024-01-28 DIAGNOSIS — Z0001 Encounter for general adult medical examination with abnormal findings: Secondary | ICD-10-CM

## 2024-01-28 DIAGNOSIS — N183 Chronic kidney disease, stage 3 unspecified: Secondary | ICD-10-CM | POA: Diagnosis not present

## 2024-01-28 DIAGNOSIS — N528 Other male erectile dysfunction: Secondary | ICD-10-CM

## 2024-01-28 DIAGNOSIS — I129 Hypertensive chronic kidney disease with stage 1 through stage 4 chronic kidney disease, or unspecified chronic kidney disease: Secondary | ICD-10-CM | POA: Diagnosis not present

## 2024-01-28 DIAGNOSIS — H9193 Unspecified hearing loss, bilateral: Secondary | ICD-10-CM

## 2024-01-28 DIAGNOSIS — R351 Nocturia: Secondary | ICD-10-CM

## 2024-01-28 DIAGNOSIS — E782 Mixed hyperlipidemia: Secondary | ICD-10-CM | POA: Diagnosis not present

## 2024-01-28 DIAGNOSIS — Z79899 Other long term (current) drug therapy: Secondary | ICD-10-CM | POA: Diagnosis not present

## 2024-01-28 DIAGNOSIS — H918X3 Other specified hearing loss, bilateral: Secondary | ICD-10-CM | POA: Diagnosis not present

## 2024-01-28 DIAGNOSIS — Z0000 Encounter for general adult medical examination without abnormal findings: Secondary | ICD-10-CM

## 2024-01-28 LAB — POCT URINALYSIS DIP (CLINITEK)
Bilirubin, UA: NEGATIVE
Blood, UA: NEGATIVE
Glucose, UA: NEGATIVE mg/dL
Ketones, POC UA: NEGATIVE mg/dL
Leukocytes, UA: NEGATIVE
Nitrite, UA: NEGATIVE
POC PROTEIN,UA: NEGATIVE
Spec Grav, UA: 1.01
Urobilinogen, UA: 0.2 U/dL
pH, UA: 6

## 2024-01-28 MED ORDER — ATORVASTATIN CALCIUM 20 MG PO TABS: 20.0000 mg | ORAL_TABLET | Freq: Every day | ORAL | 0 refills | Status: AC

## 2024-01-28 MED ORDER — SILDENAFIL CITRATE 50 MG PO TABS: 50.0000 mg | ORAL_TABLET | Freq: Every day | ORAL | 5 refills | Status: AC | PRN

## 2024-01-28 NOTE — Patient Instructions (Signed)
 Health Maintenance  Topic Date Due   COVID-19 Vaccine (6 - 2025-26 season) 02/13/2024*   Flu Shot  05/09/2024*   Medicare Annual Wellness Visit  09/28/2024   DTaP/Tdap/Td vaccine (2 - Td or Tdap) 01/08/2031   Pneumococcal Vaccine for age over 8  Completed   Zoster (Shingles) Vaccine  Completed   Meningitis B Vaccine  Aged Out   Screening for Lung Cancer  Discontinued   Colon Cancer Screening  Discontinued   Hepatitis C Screening  Discontinued  *Topic was postponed. The date shown is not the original due date.    VISIT SUMMARY: You had your annual physical exam today. Overall, you have been doing well and have not seen any other doctors recently. You mentioned that you sometimes miss meals and eat late in the day, and you are taking your cholesterol and blood pressure medications regularly. You have received your flu and COVID-19 vaccines. You also reported some difficulty with hearing and are considering getting a hearing aid. You use partial dentures and have no issues with constipation or diarrhea. You urinate three to four times a night but have no difficulty during the day.  YOUR PLAN: -HYPERTENSIVE CHRONIC KIDNEY DISEASE STAGE 3: This condition means that you have high blood pressure and moderate kidney damage. Your blood pressure is well-controlled at 130/80 mmHg. We checked your prostate level today.  -MIXED HYPERLIPIDEMIA: This condition means you have high levels of different types of fats in your blood. Continue taking your cholesterol medication as prescribed.  -NOCTURIA: This condition means you urinate frequently at night. You urinate three to four times per night without difficulty. We checked your prostate level today.  -HEARING LOSS: You have reported difficulty hearing and are considering a hearing aid. We provided you with a referral to see an audiologist.  -GENERAL HEALTH MAINTENANCE: You have received your flu and COVID-19 vaccinations. We discussed the importance of  regular exercise and maintaining a healthy diet. Please try to engage in regular physical activity and eat balanced meals.  INSTRUCTIONS: Please follow up with the audiologist for your hearing evaluation. Continue taking your medications as prescribed. Engage in regular exercise and maintain a healthy diet. If you have any new symptoms or concerns, please schedule an appointment.   Contains text generated by [employer]bridge.

## 2024-01-28 NOTE — Progress Notes (Signed)
 LILLETTE Kristeen JINNY Gladis, CMA,acting as a neurosurgeon for Gaines Ada, FNP.,have documented all relevant documentation on the behalf of Gaines Ada, FNP,as directed by  Gaines Ada, FNP while in the presence of Gaines Ada, FNP.  Subjective:   Patient ID: Patrick [facility] , male    DOB: [birth year] , 83 y.o.   MRN: [id number]  Chief Complaint  Patient presents with   Annual Exam    Patient presents today for HM, Patient reports compliance with medication. Patient denies any chest pain, SOB, or headaches. Patient has no concerns today.     Patient presents today for hypertension follow up. Patient has no other complaints or concerns.   He had to reschedule his appt with Oncology. Reports he has been doing well. He is not wanting to pay $260 for his deductible for his colonoscopy that his Oncologist ordered, he is rescheduled for June 6th. State I don't have any health issues I feel good   Hypertension This is a chronic problem. The current episode started more than 1 year ago. The problem has been gradually worsening since onset. The problem is uncontrolled. Pertinent negatives include no anxiety, chest pain, headaches or palpitations. There are no associated agents to hypertension. Risk factors for coronary artery disease include male gender and sedentary lifestyle. Past treatments include beta blockers. The current treatment provides no improvement. Compliance problems include diet (eats increased amounts of salt).  There is no history of angina or kidney disease. There is no history of chronic renal disease.    Discussed the use of AI scribe software for clinical note transcription with the patient, who gave verbal consent to proceed.  History of Present Illness Patrick Woodward is an 83 year old male who presents for an annual physical exam.  He has been doing well overall and has not seen any other doctors recently. He anticipates needing to see another doctor in the future but has not  returned to the gastroenterologist.  He eats well but sometimes misses meals and eats late in the day, especially when not working. He is currently taking his cholesterol medication regularly and took his blood pressure medication this morning, but did not take his vitamins today.  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  He has already received his flu shot and COVID-19 vaccine at [employer], with both administered in one visit.  He reports some difficulty with hearing and is considering getting a hearing aid. He has not yet seen an audiologist for this issue.  He uses partial dentures, which he currently has in his pocket.  No constipation or diarrhea. He urinates three to four times a night but does not have difficulty with urination during the day.  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     Family History  Problem Relation Age of Onset   Cancer Mother        colon   Cancer Father        prostate     Current Outpatient Medications:    aspirin  81 MG tablet, Take 1 tablet (81 mg total) by  mouth daily., Disp: 100 tablet, Rfl: 12   cholecalciferol (VITAMIN D ) 1000 UNITS tablet, Take 2,000 Units by mouth daily., Disp: , Rfl:    metoprolol  succinate (TOPROL -XL) 25 MG 24 hr tablet, Take 1 tablet (25 mg total) by mouth daily., Disp: 90 tablet, Rfl: 1   metoprolol  succinate (TOPROL -XL) 50 MG 24 hr tablet, TAKE 1 TABLET BY MOUTH ONCE DAILY IN AM WITH 25 MG TABLET (TOTAL 75 MG), Disp: 90 tablet, Rfl: 1   Multiple Vitamin (MULTIVITAMIN PO), Take 1 tablet by mouth daily., Disp: , Rfl:     vitamin E 180 MG (400 UNITS) capsule, Take 400 Units by mouth daily., Disp: , Rfl:    atorvastatin  (LIPITOR) 20 MG tablet, Take 1 tablet (20 mg total) by mouth daily., Disp: 90 tablet, Rfl: 0   sildenafil  (VIAGRA ) 50 MG tablet, Take 1 tablet (50 mg total) by mouth daily as needed for erectile dysfunction., Disp: 30 tablet, Rfl: 5   Allergies  Allergen Reactions   Procardia [Nifedipine] Other (See Comments)    irratic heart rate      Men's preventive visit. Patient Health Questionnaire (PHQ-2) is  Flowsheet Row Clinical Support from 09/29/2023 in [facility]  PHQ-2 Total Score 0  Patient is on a Regular diet.  Exercising with yard work. 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  Marital status: Divorced. Relevant history for alcohol use is:  Social History   Substance and Sexual Activity  Alcohol Use No  . Relevant history for tobacco use is: Tobacco Use History[1].   Review of Systems  Constitutional: Negative.   HENT: Negative.    Eyes: Negative.   Respiratory: Negative.    Cardiovascular: Negative.  Negative for chest pain and palpitations.  Gastrointestinal: Negative.   Endocrine: Negative.   Genitourinary: Negative.   Musculoskeletal: Negative.   Skin: Negative.   Allergic/Immunologic: Negative.   Neurological: Negative.  Negative for headaches.  Hematological: Negative.   Psychiatric/Behavioral: Negative.       Today's Vitals   01/28/24 0843  BP: 130/80  Pulse: 78  Temp: 98.6 F (37 C)  TempSrc: Oral  Weight: 202 lb (91.6 kg)  Height: 6' 5 (1.956 m)  PainSc: 0-No pain   Body mass index is 23.95 kg/m.  Wt Readings from Last 3 Encounters:  01/28/24 202 lb (91.6 kg)  10/14/23 208 lb (94.3 kg)  09/30/23 203 lb (92.1 kg)    Objective:  Physical Exam Vitals and nursing note reviewed.  Constitutional:      General: He is not in acute distress.    Appearance: Normal appearance.  HENT:     Head: Normocephalic  and atraumatic.     Right Ear: Tympanic membrane, ear canal and external ear normal. There is no impacted cerumen.     Left Ear: Tympanic membrane, ear canal and external ear normal. There is no impacted cerumen.     Nose: Nose normal.     Mouth/Throat:     Mouth: Mucous membranes are moist.  Eyes:     Extraocular Movements: Extraocular movements intact.     Conjunctiva/sclera: Conjunctivae normal.     Pupils: Pupils are equal, round, and reactive to light.  Cardiovascular:     Rate and Rhythm: Normal rate and regular rhythm.     Pulses: Normal pulses.     Heart sounds: Normal heart sounds. No murmur heard. Pulmonary:     Effort: Pulmonary effort is normal. No respiratory distress.  Breath sounds: Normal breath sounds. No wheezing.  Abdominal:     General: Abdomen is flat. Bowel sounds are normal. There is no distension.     Palpations: Abdomen is soft.     Tenderness: There is no abdominal tenderness.  Genitourinary:    Comments: Deferred - followed by Urology Musculoskeletal:        General: No swelling, tenderness or deformity. Normal range of motion.     Cervical back: Normal range of motion and neck supple. No rigidity.  Skin:    General: Skin is warm and dry.     Capillary Refill: Capillary refill takes less than 2 seconds.     Comments: Healed surgical scar to mid and left lower abdomen.    Neurological:     General: No focal deficit present.     Mental Status: He is alert and oriented to person, place, and time.     Cranial Nerves: No cranial nerve deficit.     Motor: No weakness.  Psychiatric:        Mood and Affect: Mood normal.        Behavior: Behavior normal.        Thought Content: Thought content normal.        Judgment: Judgment normal.         Assessment And Plan:    Encounter for annual health examination Assessment & Plan: Behavior modifications discussed and diet history reviewed.   Pt will continue to exercise regularly and modify diet with low  GI, plant based foods and decrease intake of processed foods.  Recommend intake of daily multivitamin, Vitamin D , and calcium .  Recommend colonoscopy for preventive screenings, as well as recommend immunizations that include influenza, TDAP, and Shingles Received flu and COVID vaccinations. Discussed importance of regular exercise and healthy diet.        - Encouraged regular exercise and healthy diet.    Benign hypertension with chronic kidney disease, stage III (HCC) Assessment & Plan: Blood pressure controlled at 130/80 mmHg. No recent nephrology follow-up. - Checked prostate level.  Orders: -     POCT URINALYSIS DIP (CLINITEK) -     Microalbumin / creatinine urine ratio -     CMP14+EGFR  Mixed hyperlipidemia Assessment & Plan: Continues cholesterol medication as prescribed.  Orders: -     Lipid panel -     Atorvastatin  Calcium ; Take 1 tablet (20 mg total) by mouth daily.  Dispense: 90 tablet; Refill: 0  Other long term (current) drug therapy -     CBC with Differential/Platelet  History of colon cancer  Nocturia Assessment & Plan: Urinates three to four times per night without difficulty. No nephrology follow-up. - Checked prostate level.  Orders: -     PSA  Other male erectile dysfunction  Erectile dysfunction, unspecified erectile dysfunction type -     Sildenafil  Citrate; Take 1 tablet (50 mg total) by mouth daily as needed for erectile dysfunction.  Dispense: 30 tablet; Refill: 5  Other specified hearing loss of both ears Assessment & Plan: Reports difficulty hearing, considering hearing aid. No prior audiology evaluation. - Provided referral to audiologist.  Orders: -     Ambulatory referral to Audiology  Bilateral hearing loss, unspecified hearing loss type Assessment & Plan: Reports difficulty hearing, considering hearing aid. No prior audiology evaluation. - Provided referral to audiologist.     Return for 1 year physical, 6 month bp  check. Patient was given opportunity to ask questions. Patient verbalized understanding of the plan  and was able to repeat key elements of the plan. All questions were answered to their satisfaction.   Gaines Ada, FNP  I, Gaines Ada, FNP, have reviewed all documentation for this visit. The documentation on 01/28/2024 for the exam, diagnosis, procedures, and orders are all accurate and complete.      [1]  Social History Tobacco Use  Smoking Status Former   Current packs/day: 0.00   Average packs/day: 1 pack/day for 20.0 years (20.0 ttl pk-yrs)   Types: Cigarettes, Pipe, Cigars   Quit date: 11/29/1978   Years since quitting: 45.2  Smokeless Tobacco Never  Tobacco Comments   Quite 40 years ago

## 2024-01-29 LAB — CBC WITH DIFFERENTIAL/PLATELET
Basophils Absolute: 0 x10E3/uL (ref 0.0–0.2)
Basos: 0 %
EOS (ABSOLUTE): 0.1 x10E3/uL (ref 0.0–0.4)
Eos: 2 %
Hematocrit: 51.1 % — ABNORMAL HIGH (ref 37.5–51.0)
Hemoglobin: 16.1 g/dL (ref 13.0–17.7)
Immature Grans (Abs): 0 x10E3/uL (ref 0.0–0.1)
Immature Granulocytes: 0 %
Lymphocytes Absolute: 1.5 x10E3/uL (ref 0.7–3.1)
Lymphs: 33 %
MCH: 28.4 pg (ref 26.6–33.0)
MCHC: 31.5 g/dL (ref 31.5–35.7)
MCV: 90 fL (ref 79–97)
Monocytes Absolute: 0.7 x10E3/uL (ref 0.1–0.9)
Monocytes: 15 %
Neutrophils Absolute: 2.4 x10E3/uL (ref 1.4–7.0)
Neutrophils: 50 %
Platelets: 176 x10E3/uL (ref 150–450)
RBC: 5.66 x10E6/uL (ref 4.14–5.80)
RDW: 15.4 % (ref 11.6–15.4)
WBC: 4.7 x10E3/uL (ref 3.4–10.8)

## 2024-01-29 LAB — MICROALBUMIN / CREATININE URINE RATIO
Creatinine, Urine: 96.5 mg/dL
Microalb/Creat Ratio: 18 mg/g{creat} (ref 0–29)
Microalbumin, Urine: 17.3 ug/mL

## 2024-01-29 LAB — CMP14+EGFR
ALT: 26 IU/L (ref 0–44)
AST: 25 IU/L (ref 0–40)
Albumin: 4.6 g/dL (ref 3.7–4.7)
Alkaline Phosphatase: 72 IU/L (ref 48–129)
BUN/Creatinine Ratio: 10 (ref 10–24)
BUN: 13 mg/dL (ref 8–27)
Bilirubin Total: 1 mg/dL (ref 0.0–1.2)
CO2: 21 mmol/L (ref 20–29)
Calcium: 9.4 mg/dL (ref 8.6–10.2)
Chloride: 101 mmol/L (ref 96–106)
Creatinine, Ser: 1.26 mg/dL (ref 0.76–1.27)
Globulin, Total: 2.6 g/dL (ref 1.5–4.5)
Glucose: 91 mg/dL (ref 70–99)
Potassium: 4.2 mmol/L (ref 3.5–5.2)
Sodium: 138 mmol/L (ref 134–144)
Total Protein: 7.2 g/dL (ref 6.0–8.5)
eGFR: 57 mL/min/1.73 — ABNORMAL LOW

## 2024-01-29 LAB — LIPID PANEL
Chol/HDL Ratio: 4.5 ratio (ref 0.0–5.0)
Cholesterol, Total: 135 mg/dL (ref 100–199)
HDL: 30 mg/dL — ABNORMAL LOW
LDL Chol Calc (NIH): 78 mg/dL (ref 0–99)
Triglycerides: 155 mg/dL — ABNORMAL HIGH (ref 0–149)
VLDL Cholesterol Cal: 27 mg/dL (ref 5–40)

## 2024-01-29 LAB — PSA: Prostate Specific Ag, Serum: 0.8 ng/mL (ref 0.0–4.0)

## 2024-01-31 ENCOUNTER — Encounter: Payer: Self-pay | Admitting: Nurse Practitioner

## 2024-02-06 ENCOUNTER — Ambulatory Visit: Payer: Self-pay | Admitting: Nurse Practitioner

## 2024-02-06 DIAGNOSIS — R351 Nocturia: Secondary | ICD-10-CM | POA: Insufficient documentation

## 2024-02-06 DIAGNOSIS — H9193 Unspecified hearing loss, bilateral: Secondary | ICD-10-CM | POA: Insufficient documentation

## 2024-02-06 NOTE — Assessment & Plan Note (Signed)
 Urinates three to four times per night without difficulty. No nephrology follow-up. - Checked prostate level.

## 2024-02-06 NOTE — Assessment & Plan Note (Signed)
 Reports difficulty hearing, considering hearing aid. No prior audiology evaluation. - Provided referral to audiologist.

## 2024-02-06 NOTE — Assessment & Plan Note (Signed)
 Continues cholesterol medication as prescribed.

## 2024-02-06 NOTE — Assessment & Plan Note (Signed)
 Behavior modifications discussed and diet history reviewed.   Pt will continue to exercise regularly and modify diet with low GI, plant based foods and decrease intake of processed foods.  Recommend intake of daily multivitamin, Vitamin D , and calcium .  Recommend colonoscopy for preventive screenings, as well as recommend immunizations that include influenza, TDAP, and Shingles Received flu and COVID vaccinations. Discussed importance of regular exercise and healthy diet.        - Encouraged regular exercise and healthy diet.

## 2024-02-06 NOTE — Assessment & Plan Note (Signed)
 Blood pressure controlled at 130/80 mmHg. No recent nephrology follow-up. - Checked prostate level.

## 2024-02-17 ENCOUNTER — Telehealth: Payer: Self-pay | Admitting: Oncology

## 2024-02-28 ENCOUNTER — Other Ambulatory Visit: Payer: Self-pay | Admitting: *Deleted

## 2024-02-28 DIAGNOSIS — C189 Malignant neoplasm of colon, unspecified: Secondary | ICD-10-CM

## 2024-02-29 ENCOUNTER — Inpatient Hospital Stay: Attending: Oncology

## 2024-02-29 ENCOUNTER — Inpatient Hospital Stay: Admitting: Oncology

## 2024-02-29 VITALS — BP 116/82 | HR 60 | Temp 97.8°F | Resp 18 | Ht 77.0 in | Wt 206.1 lb

## 2024-02-29 DIAGNOSIS — C189 Malignant neoplasm of colon, unspecified: Secondary | ICD-10-CM | POA: Diagnosis not present

## 2024-02-29 LAB — CEA (ACCESS): CEA (CHCC): 5.22 ng/mL — ABNORMAL HIGH (ref 0.00–5.00)

## 2024-02-29 NOTE — Progress Notes (Signed)
 " [facility] OFFICE PROGRESS NOTE   Diagnosis: Colon cancer  INTERVAL HISTORY:   Patrick Woodward returns as scheduled.  He feels well.  Good appetite.  No difficulty with bowel function.  The stool is sometimes hard .  No bleeding.  He has mild neuropathy symptoms in the hands and feet.  This does not interfere with activity.  Objective:  Vital signs in last 24 hours:  Blood pressure 116/82, pulse 60, temperature 97.8 F (36.6 C), temperature source Temporal, resp. rate 18, height 6' 5 (1.956 m), weight 206 lb 1.6 oz (93.5 kg), SpO2 98%.    Lymphatics: No cervical, supraclavicular, axillary, or inguinal nodes Resp: Lungs clear bilaterally Cardio: Regular rate and rhythm GI: No hepatosplenomegaly, no mass, nontender, small soft incisional hernia Vascular: No leg edema   Lab Results:  Lab Results  Component Value Date   WBC 4.7 01/28/2024   HGB 16.1 01/28/2024   HCT 51.1 (H) 01/28/2024   MCV 90 01/28/2024   PLT 176 01/28/2024   NEUTROABS 2.4 01/28/2024    CMP  Lab Results  Component Value Date   NA 138 01/28/2024   K 4.2 01/28/2024   CL 101 01/28/2024   CO2 21 01/28/2024   GLUCOSE 91 01/28/2024   BUN 13 01/28/2024   CREATININE 1.26 01/28/2024   CALCIUM  9.4 01/28/2024   PROT 7.2 01/28/2024   ALBUMIN 4.6 01/28/2024   AST 25 01/28/2024   ALT 26 01/28/2024   ALKPHOS 72 01/28/2024   BILITOT 1.0 01/28/2024   GFRNONAA 55 (L) 11/23/2019   GFRAA 63 11/23/2019    Lab Results  Component Value Date   CEA1 5.67 (H) 11/19/2020   CEA 5.22 (H) 02/29/2024    Medications: I have reviewed the patient's current medications.   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 Status post cycle #1 adjuvant FOLFOX chemotherapy 12/01/2010. He completed cycle 2 beginning 12/15/2010. He completed cycle 3 beginning 01/05/2011  with Neulasta  support. He completed cycle 11 on 05/04/2011. He completed the 12th and final cycle on 05/18/2011.   Negative colonoscopy 09/08/2011, benign polypoid lesion in the descending colon. Colonoscopy 08/28/2013 with multiple benign polyps removed. Restaging CT scans 10/26/2012 with no evidence of metastatic disease. Restaging CT scans 06/24/2013 with no evidence of recurrent disease Surveillance colonoscopy 08/17/2016- 16 mm polyp in the cecum; 10 mm polyp in the ascending colon; 2 mm polyp in the ascending colon; 5 mm polyp in the transverse colon.  Pathology on the cecal polyp-tubular adenoma, 1.6 cm; pathology on polyps descending/transverse colon-tubular adenomas. Mildly elevated CEA 12/07/2016, stable mild elevation 01/07/2017, improved but continued mild elevation 02/23/2017; CT scans recommended, patient declined due to renal dysfunction.  Chronic mild elevation of the CEA History of a bowel obstruction secondary to the primary colon mass. History of abdominal pain/constipation secondary to the descending/sigmoid colon mass. History of weight loss. Improved. Status post Port-A-Cath placement 11/27/2010. Removed 12/02/2011. History of neutropenia secondary to chemotherapy. Cycle 3 FOLFOX was held 12/29/2010 due to neutropenia. He received Neulasta  with cycle #3 FOLFOX chemotherapy on 01/05/2011. He did not receive Neulasta  following cycle 4. Cycle 5 was held for one week due to neutropenia. He received Neulasta  with subsequent treatments. Neulasta  was held beginning with cycle 10 since the oxaliplatin  was discontinued. Thrombocytopenia secondary to chemotherapy. Oxaliplatin  was dose reduced beginning with cycle 7. Back pain following cycle 3 FOLFOX. Likely related to Neulasta . 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 Improved.   Delayed nausea following cycle 7 of FOLFOX. Aloxi  was added to the antiemetic regimen beginning with cycle 8 FOLFOX. He  also began prophylactic Decadron  on days 2 and 3 following chemotherapy with cycle 8. He did not have nausea following cycle 8 or cycle 9 FOLFOX Colostomy reversal 12/02/2011. Mild elevation of the creatinine 10/26/2012. Repeat value on 10/28/2012 stable at 1.5. He is followed by nephrology    Disposition: Patrick Woodward is in clinical remission from colon cancer.  He has chronic mild elevation of the CEA.  This is likely a benign finding.  He would like [facility] at [facility].  He will return for an office visit in 1 year.  Arley Hof, MD  02/29/2024  9:37 AM   "

## 2024-07-31 ENCOUNTER — Ambulatory Visit: Payer: Self-pay | Admitting: Nurse Practitioner

## 2024-10-18 ENCOUNTER — Ambulatory Visit: Payer: Self-pay

## 2025-01-30 ENCOUNTER — Encounter: Payer: Self-pay | Admitting: Nurse Practitioner

## 2025-02-27 ENCOUNTER — Inpatient Hospital Stay: Admitting: Oncology

## 2025-02-27 ENCOUNTER — Inpatient Hospital Stay
